# Patient Record
Sex: Female | Born: 1950
Health system: Southern US, Community
[De-identification: ages and names within clinical notes are randomized; demographics above are authoritative.]

## PROBLEM LIST (undated history)

## (undated) DIAGNOSIS — I219 Acute myocardial infarction, unspecified: Secondary | ICD-10-CM

## (undated) DIAGNOSIS — M1990 Unspecified osteoarthritis, unspecified site: Secondary | ICD-10-CM

## (undated) DIAGNOSIS — T4145XA Adverse effect of unspecified anesthetic, initial encounter: Secondary | ICD-10-CM

## (undated) DIAGNOSIS — K649 Unspecified hemorrhoids: Secondary | ICD-10-CM

## (undated) DIAGNOSIS — Z460 Encounter for fitting and adjustment of spectacles and contact lenses: Secondary | ICD-10-CM

## (undated) DIAGNOSIS — E669 Obesity, unspecified: Secondary | ICD-10-CM

## (undated) DIAGNOSIS — E039 Hypothyroidism, unspecified: Secondary | ICD-10-CM

## (undated) DIAGNOSIS — I2510 Atherosclerotic heart disease of native coronary artery without angina pectoris: Secondary | ICD-10-CM

## (undated) DIAGNOSIS — E785 Hyperlipidemia, unspecified: Secondary | ICD-10-CM

## (undated) DIAGNOSIS — Z87440 Personal history of urinary (tract) infections: Secondary | ICD-10-CM

## (undated) DIAGNOSIS — I10 Essential (primary) hypertension: Secondary | ICD-10-CM

## (undated) DIAGNOSIS — R0683 Snoring: Secondary | ICD-10-CM

## (undated) DIAGNOSIS — E119 Type 2 diabetes mellitus without complications: Secondary | ICD-10-CM

## (undated) DIAGNOSIS — T8859XA Other complications of anesthesia, initial encounter: Secondary | ICD-10-CM

## (undated) DIAGNOSIS — T7840XA Allergy, unspecified, initial encounter: Secondary | ICD-10-CM

## (undated) DIAGNOSIS — U071 COVID-19: Secondary | ICD-10-CM

## (undated) DIAGNOSIS — R42 Dizziness and giddiness: Secondary | ICD-10-CM

## (undated) HISTORY — DX: Personal history of urinary (tract) infections: Z87.440

## (undated) HISTORY — PX: OTHER SURGICAL HISTORY: SHX169

## (undated) HISTORY — DX: Allergy, unspecified, initial encounter: T78.40XA

## (undated) HISTORY — DX: Atherosclerotic heart disease of native coronary artery without angina pectoris: I25.10

## (undated) HISTORY — DX: COVID-19: U07.1

## (undated) HISTORY — DX: Obesity, unspecified: E66.9

## (undated) HISTORY — PX: COLONOSCOPY: SHX174

## (undated) HISTORY — DX: Hyperlipidemia, unspecified: E78.5

## (undated) HISTORY — DX: Acute myocardial infarction, unspecified: I21.9

## (undated) HISTORY — PX: TUBAL LIGATION: SHX77

## (undated) HISTORY — DX: Unspecified hemorrhoids: K64.9

---

## 2001-05-04 ENCOUNTER — Emergency Department (HOSPITAL_COMMUNITY): Admission: EM | Admit: 2001-05-04 | Discharge: 2001-05-04 | Payer: Self-pay | Admitting: Emergency Medicine

## 2002-01-16 ENCOUNTER — Other Ambulatory Visit: Admission: RE | Admit: 2002-01-16 | Discharge: 2002-01-16 | Payer: Self-pay | Admitting: *Deleted

## 2004-04-03 ENCOUNTER — Other Ambulatory Visit: Admission: RE | Admit: 2004-04-03 | Discharge: 2004-04-03 | Payer: Self-pay | Admitting: *Deleted

## 2006-09-14 HISTORY — PX: CARDIAC CATHETERIZATION: SHX172

## 2007-01-13 DIAGNOSIS — I2510 Atherosclerotic heart disease of native coronary artery without angina pectoris: Secondary | ICD-10-CM

## 2007-01-13 HISTORY — DX: Atherosclerotic heart disease of native coronary artery without angina pectoris: I25.10

## 2007-02-09 ENCOUNTER — Observation Stay (HOSPITAL_COMMUNITY): Admission: EM | Admit: 2007-02-09 | Discharge: 2007-02-10 | Payer: Self-pay | Admitting: Emergency Medicine

## 2007-03-07 ENCOUNTER — Ambulatory Visit: Payer: Self-pay | Admitting: Cardiology

## 2007-06-14 ENCOUNTER — Ambulatory Visit: Payer: Self-pay | Admitting: Cardiology

## 2007-10-07 ENCOUNTER — Ambulatory Visit: Payer: Self-pay | Admitting: Cardiology

## 2008-02-01 ENCOUNTER — Ambulatory Visit: Payer: Self-pay | Admitting: Cardiology

## 2008-08-01 ENCOUNTER — Ambulatory Visit: Payer: Self-pay | Admitting: Cardiology

## 2009-02-05 ENCOUNTER — Ambulatory Visit: Payer: Self-pay | Admitting: Cardiology

## 2009-07-24 ENCOUNTER — Encounter (INDEPENDENT_AMBULATORY_CARE_PROVIDER_SITE_OTHER): Payer: Self-pay | Admitting: *Deleted

## 2009-07-30 ENCOUNTER — Ambulatory Visit: Payer: Self-pay | Admitting: Cardiology

## 2009-10-10 ENCOUNTER — Emergency Department (HOSPITAL_COMMUNITY): Admission: EM | Admit: 2009-10-10 | Discharge: 2009-10-10 | Payer: Self-pay | Admitting: Emergency Medicine

## 2009-10-17 ENCOUNTER — Ambulatory Visit: Payer: Self-pay | Admitting: Cardiology

## 2009-12-10 ENCOUNTER — Encounter (INDEPENDENT_AMBULATORY_CARE_PROVIDER_SITE_OTHER): Payer: Self-pay | Admitting: *Deleted

## 2010-09-22 ENCOUNTER — Emergency Department (HOSPITAL_COMMUNITY)
Admission: EM | Admit: 2010-09-22 | Discharge: 2010-09-22 | Payer: Self-pay | Source: Home / Self Care | Admitting: Emergency Medicine

## 2010-09-29 LAB — POCT CARDIAC MARKERS
CKMB, poc: 1.5 ng/mL (ref 1.0–8.0)
Myoglobin, poc: 51.3 ng/mL (ref 12–200)
Troponin i, poc: <0.05 ng/mL (ref 0.00–0.09)

## 2010-09-29 LAB — CBC
HCT: 39.5 % (ref 36.0–46.0)
Hemoglobin: 12.9 g/dL (ref 12.0–15.0)
MCH: 26.5 pg (ref 26.0–34.0)
MCHC: 32.7 g/dL (ref 30.0–36.0)
MCV: 81.3 fL (ref 78.0–100.0)
Platelets: 276 10*3/uL (ref 150–400)
RBC: 4.86 MIL/uL (ref 3.87–5.11)
RDW: 13.6 % (ref 11.5–15.5)
WBC: 8.3 10*3/uL (ref 4.0–10.5)

## 2010-09-29 LAB — DIFFERENTIAL
Basophils Absolute: 0.1 10*3/uL (ref 0.0–0.1)
Basophils Relative: 1 % (ref 0–1)
Eosinophils Absolute: 0.4 10*3/uL (ref 0.0–0.7)
Eosinophils Relative: 4 % (ref 0–5)
Lymphocytes Relative: 35 % (ref 12–46)
Lymphs Abs: 2.9 10*3/uL (ref 0.7–4.0)
Monocytes Absolute: 0.5 10*3/uL (ref 0.1–1.0)
Monocytes Relative: 6 % (ref 3–12)
Neutro Abs: 4.5 10*3/uL (ref 1.7–7.7)
Neutrophils Relative %: 54 % (ref 43–77)

## 2010-09-29 LAB — POCT I-STAT, CHEM 8
BUN: 21 mg/dL (ref 6–23)
Calcium, Ion: 1.19 mmol/L (ref 1.12–1.32)
Chloride: 106 mEq/L (ref 96–112)
Creatinine, Ser: 1 mg/dL (ref 0.4–1.2)
Glucose, Bld: 131 mg/dL — ABNORMAL HIGH (ref 70–99)
HCT: 41 % (ref 36.0–46.0)
Hemoglobin: 13.9 g/dL (ref 12.0–15.0)
Potassium: 4 mEq/L (ref 3.5–5.1)
Sodium: 140 mEq/L (ref 135–145)
TCO2: 26 mmol/L (ref 0–100)

## 2010-10-16 NOTE — Miscellaneous (Signed)
  Clinical Lists Changes  Observations: Added new observation of RS STUDY: TRACER- study completion 10/15/09 (12/10/2009 11:08)      Research Study Name: TRACER- study completion 10/15/09

## 2010-11-30 LAB — CBC
HCT: 40.5 % (ref 36.0–46.0)
Hemoglobin: 13.8 g/dL (ref 12.0–15.0)
MCHC: 34 g/dL (ref 30.0–36.0)
MCV: 80.8 fL (ref 78.0–100.0)
Platelets: 236 10*3/uL (ref 150–400)
RBC: 5.01 MIL/uL (ref 3.87–5.11)
RDW: 13.4 % (ref 11.5–15.5)
WBC: 6.3 10*3/uL (ref 4.0–10.5)

## 2010-11-30 LAB — BASIC METABOLIC PANEL
BUN: 22 mg/dL (ref 6–23)
CO2: 27 mEq/L (ref 19–32)
Calcium: 9.7 mg/dL (ref 8.4–10.5)
Chloride: 96 mEq/L (ref 96–112)
Creatinine, Ser: 1.2 mg/dL (ref 0.4–1.2)
GFR calc Af Amer: 55 mL/min — ABNORMAL LOW (ref >60)
GFR calc non Af Amer: 46 mL/min — ABNORMAL LOW (ref >60)
Glucose, Bld: 934 mg/dL (ref 70–99)
Potassium: 4 mEq/L (ref 3.5–5.1)
Sodium: 133 mEq/L — ABNORMAL LOW (ref 135–145)

## 2010-11-30 LAB — URINALYSIS, ROUTINE W REFLEX MICROSCOPIC
Bilirubin Urine: NEGATIVE
Glucose, UA: >1000 mg/dL — AB
Hgb urine dipstick: NEGATIVE
Ketones, ur: 15 mg/dL — AB
Leukocytes, UA: NEGATIVE
Nitrite: NEGATIVE
Protein, ur: NEGATIVE mg/dL
Specific Gravity, Urine: 1.031 — ABNORMAL HIGH (ref 1.005–1.030)
Urobilinogen, UA: 0.2 mg/dL (ref 0.0–1.0)
pH: 5 (ref 5.0–8.0)

## 2010-11-30 LAB — DIFFERENTIAL
Basophils Absolute: 0 10*3/uL (ref 0.0–0.1)
Basophils Relative: 1 % (ref 0–1)
Eosinophils Absolute: 0.2 10*3/uL (ref 0.0–0.7)
Eosinophils Relative: 3 % (ref 0–5)
Lymphocytes Relative: 30 % (ref 12–46)
Lymphs Abs: 1.9 10*3/uL (ref 0.7–4.0)
Monocytes Absolute: 0.5 10*3/uL (ref 0.1–1.0)
Monocytes Relative: 9 % (ref 3–12)
Neutro Abs: 3.6 10*3/uL (ref 1.7–7.7)
Neutrophils Relative %: 58 % (ref 43–77)

## 2010-11-30 LAB — GLUCOSE, CAPILLARY
Glucose-Capillary: 219 mg/dL — ABNORMAL HIGH (ref 70–99)
Glucose-Capillary: 470 mg/dL — ABNORMAL HIGH (ref 70–99)
Glucose-Capillary: >600 mg/dL (ref 70–99)

## 2010-11-30 LAB — URINE MICROSCOPIC-ADD ON

## 2011-01-27 NOTE — H&P (Signed)
NAME:  EMALIA, WITKOP NO.:  [id number]   MEDICAL RECORD NO.:  [id number]          PATIENT TYPE:  INP   LOCATION:  1823                         FACILITY:  [facility]   PHYSICIAN:  Georgann Housekeeper, MD      DATE OF BIRTH:  [birth year]   DATE OF ADMISSION:  02/08/2007  DATE OF DISCHARGE:                              HISTORY & PHYSICAL   PRIMARY CARE PHYSICIAN:  Al Decant. Janey Greaser, MD at [facility].   CHIEF COMPLAINT:  Chest pain.   HISTORY OF PRESENT ILLNESS:  A 60 year old female with history of  hypertension, family history of cardiac disease.  She was playing tennis  after work today and developed some chest pressure, substernal shortness  of breath and nausea that lasted for about an hour.  She said it was  different than her acid symptoms; there was no indigestion.  Denies any  palpitation, cough or fevers.  This lasted for about a hour and then she  went home.  As she was going up the stairs she started getting a little  bit short-winded; lasted for 5 minutes.  She came to the emergency room,  she was chest pain free and no prior symptoms like this before.  She  denies any fever or cough.  No URI symptoms.  No GI symptoms.  No  palpitations or headache.   CARDIAC RISK FACTORS:  Family history of coronary artery disease, strong  in the father who died of MI and had earlier attack in the 50s; and  uncles and aunts of the father side with heart disease.  No tobacco.  Lipids mildly elevated.  Diabetes none.  Hypertension positive, treated  with Atacand.   ALLERGIE:  SULFA, NOVOCAINE.   MEDICATIONS:  Atacand 50 mg daily.   PAST MEDICAL HISTORY:  1. Positive history of BPH.  2. Hypertension.  3. Benign positional vertigo.   PAST SURGICAL HISTORY:  None.   SOCIAL HISTORY:  No tobacco.  Alcohol occasionally.  Married, very  active work outs.  She plays tennis.  She works for [employer].  Has 3 children; married.   FAMILY HISTORY:  Coronary  artery disease.   PHYSICAL EXAMINATION:  VITAL SIGNS:  Ttemperature 97.2, blood pressure  169/96 (repeat 109/60), pulse 63, respiration 18, 95% saturations.  GENERAL:  Awake and alert, in no acute distress.  LUNGS:  Clear.  CARDIOVASCULAR:  Without murmurs.  NECK:  Supple without any JVD.  ABDOMEN:  Soft without tenderness.  EXTREMITIES:  No edema.  NEUROLOGIC:  Exam nonfocal.   LAB DATA:  Cardiac markers in the ED were negative.  D-dimer negative.  Hemoglobin 13, creatinine 1.3, potassium 3.3.   CHEST X-RAY:  Negative.   EKG:  Normal sinus rhythm without any acute changes.   IMPRESSION:  A 60 year old female with history of risk factors of chest  pain exertional, with family history strong  for coronary disease.   CHEST PAIN.  Rule out ischemic origin with his noncardiac origin.  PLAN:  Telemetry, aspirin, oxygen; CK, troponin and EKG.  Check lipid profile  and TSH,  once ruled out with Cardiolite stress test.  [facility]  to further evaluate hypertension.  Continue on Atacand.  Add low dose  Lopressor (12.5 b.i.d.) if blood pressure allows and heart rate allows.      Georgann Housekeeper, MD  Electronically Signed     KH/MEDQ  D:  02/09/2007  T:  02/09/2007  Job:  [id number]   cc:   Al Decant. Janey Greaser, MD

## 2011-01-27 NOTE — Consult Note (Signed)
NAME:  Crystal, Hobbs NO.:  [id number]   MEDICAL RECORD NO.:  [id number]          PATIENT TYPE:  INP   LOCATION:  2905                         FACILITY:  [facility]   PHYSICIAN:  Ulyses Amor, MD DATE OF BIRTH:  [birth year]   DATE OF CONSULTATION:  02/09/2007  DATE OF DISCHARGE:                                 CONSULTATION   Crystal Hobbs is a 60 year old white woman who is admitted to [facility] for further evaluation of chest pain.   The patient, who has no past history of cardiac disease, experienced the  onset of chest pain today while playing tennis.  This was described as a  tightness across her anterior chest.  It did not radiate.  It was  associated with dyspnea but no diaphoresis or nausea.  There were no  exacerbating or ameliorating factors.  It appeared not to be related to  position, meals, or respirations.  It was only relieved once she stopped  playing tennis, and it took nearly 1 hour to resolve.  She experienced  no further chest pain until her return home, when she experienced a  similar episode of chest pain while walking up the stairs.  It, to,  resolved with cessation of the activity.  She experienced no further  chest pain for the rest of the day, although she felt somewhat weak and  lightheaded.  She is free chest pain and otherwise asymptomatic at this  time.   As noted, the patient has no past history of cardiac disease, including  no history of chest pain, myocardial infarction, congestive heart  failure, or arrhythmia.  She reports having experienced palpitations  approximately 20 years ago, but there was no documentation of any  cardiac arrhythmia.   The patient has a number of risk factors for coronary artery disease  including hypertension, dyslipidemia and family history (brother and  father).  There is no history of diabetes mellitus or smoking.   The patient's past medical history is otherwise unremarkable.   MEDICATIONS:  Atacand.   ALLERGIES:  NONE.   OPERATIONS:  Rotator cuff surgery, tubal ligation.   SOCIAL HISTORY:  The patient is married and lives with her husband.  She  owns a [employer].  She neither smokes cigarettes nor drinks  alcohol.   FAMILY HISTORY:  Is notable for coronary artery disease in both her  father and her brother.   REVIEW OF SYSTEMS:  Reveals no problems related to her head, eyes, ears,  nose, mouth, throat, lungs, gastrointestinal system, genitourinary  system, or extremities.  There is no history of neurologic or  psychiatric disorder.  There is no history of fever, chills, or weight  loss.   PHYSICAL EXAMINATION:  VITAL SIGNS:  Blood pressure 109/60.  Pulse 63  and regular.  Respirations 18.  Temperature 97.2.  Pulse ox 95% on room  air.  GENERAL:  The patient was a middle aged white woman in no discomfort.  She was alert, oriented, appropriate, and responsive.  HEENT:  Head, eyes, nose, mouth were normal.  NECK:  Without  thyromegaly or adenopathy.  Carotid pulses were palpable  bilaterally and without bruits.  CARDIAC:  Revealed a normal S1 and S2.  There was no S3, S4, murmur,  rub, or click.  Cardiac rhythm is regular.  No chest wall tenderness was  noted.  LUNGS:  Clear.  ABDOMEN:  Soft and nontender.  There was no mass, hepatosplenomegaly,  bruit, distension, rebound, guarding or rigidity.  Bowel sounds were  normal.  BREAST, PELVIC, RECTAL:  Not performed as they were not pertinent for  the reason for acute care hospitalization.  EXTREMITIES:  Were without edema, deviation, deformity.  Radial and  dorsalis pedal pulses were palpable bilaterally.  NEUROLOGIC:  Unremarkable.   The electrocardiogram was normal.  The chest radiograph report was  pending at the time of this dictation.  The initial set of cardiac  markers revealed a myoglobin of 148, CK MB 3.4, and troponin less than  0.05.  A second set of cardiac markers revealed a  myoglobin of 145, CK  MB 3.0 and troponin of 0.05.  Fibrin derivatives were less than 0.22.  Potassium is 3.3, BUN 29, creatinine 1.3.  White count was 8.0 with a  hemoglobin of 13.0 and hematocrit of 39.4.  The remaining studies were  pending at the time of this dictation.   IMPRESSION:  1. The history is highly suggestive of new onset exertional angina.  2. Hypertension.  3. Dyslipidemia.   RECOMMENDATIONS:  1. Telemetry.  2. Serial cardiac enzymes.  3. Aspirin.  4. Intravenous heparin.  5. Intravenous nitroglycerine.  6. Metoprolol.  7. Fasting lipid profile.  8. Further measures per Dr. Mayford Knife.      Ulyses Amor, MD  Electronically Signed     MSC/MEDQ  D:  02/09/2007  T:  02/09/2007  Job:  [id number]   cc:   Ulyses Amor, MD  Armanda Magic, M.D.

## 2011-01-27 NOTE — Discharge Summary (Signed)
NAMEQUINNLEY, COLASURDO                 ACCOUNT NO.:  [id number]   MEDICAL RECORD NO.:  [id number]          PATIENT TYPE:  INP   LOCATION:  2905                         FACILITY:  [facility]   PHYSICIAN:  Corinna L. Lendell Caprice, MDDATE OF BIRTH:  1951-01-04   DATE OF ADMISSION:  02/08/2007  DATE OF DISCHARGE:                               DISCHARGE SUMMARY   DISCHARGE DIAGNOSES:  1. New onset exertional angina.  2. A 90% obstructive lesion of the first diagonal status post      percutaneous transluminal coronary angioplasty/stent with 2 x 12 mm      mini Vision stent.  3. Hyperlipidemia.  4. Hypokalemia.  5. Hypertension.  6. Abnormal thyroid-stimulating hormone, needs followup of her free T4      free T3 which are pending at this time.  7. Benign positional vertigo.   DISCHARGE MEDICATIONS:  1. Aspirin 325 mg a day.  2. Plavix 75 mg a day for at least a month.  3. Crestor 10 mg nightly.   FOLLOWUP:  1. Followup with Ms. Tylene Fantasia, PA with [facility] on February 17, 2007 at 9:30 a.m.  2. Follow with Dr. [name] on March 07, 2007 at 2:15 p.m.  3. Followup with Dr. Janey Greaser to monitor liver function tests on      Crestor as well as abnormal thyroid function tests.   CONDITION:  Stable.   ACTIVITY:  No lifting for a week.  No driving for two days.  She may  return to work in four days.   DIET:  Her diet should be cardiac.   CONSULTATIONS:  [facility].   PROCEDURE:  Cardiac catheterization by Dr. [name] on Feb 09, 2007  which revealed a low normal ejection fraction of 50% and 90% lesion of  the first diagonal status post PTCA/stent on Feb 09, 2007.   PERTINENT LABORATORY DATA:  CBC:  Unremarkable.  D-dimer less than 0.22.  INR 1.0.  Potassium on admission was 3.3, glucose 133, BUN 29,  creatinine 1.13 and an, otherwise, unremarkable complete metabolic  panel.  At discharge, her potassium was 3.9.  Her peak CPK was 172, peak  CPK-MB was 5.8 and peak  troponin 0.16.  Total cholesterol 231,  triglycerides 265, HDL 32, LDL 146.  TSH 7.255.  Free T4 and free T3 are  pending.   SPECIAL STUDIES IN RADIOLOGY:  A chest x-ray showed nothing acute.  EKG  showed normal sinus rhythm with sinus arrhythmia.   HISTORY AND HOSPITAL COURSE:  Ms. Outten is a 60 year old white female  patient of Dr. Doran Clay who presented to the emergency room with  exertional chest pain.  Initial EKG and cardiac enzymes were negative.  She had unremarkable vital signs and examination.  She was admitted to  the Hospitalist Service and Cardiology was consulted.  She was started  on a heparin drip, aspirin, beta blocker and nitroglycerin.  Her  potassium was repleted.  She underwent cardiac catheterization as above.  During the procedure, she did have some mild bradycardia and the beta  blocker was stopped.  She had no recurrence of chest pain.  She was  found to be hyperlipidemic and was started on Zocor.  She requested that  it be switched to Crestor, as apparently she has a family member who has  been on this in the past.  She was also started on aspirin and Plavix.Marland Kitchen  Her TSH was ordered on admission and was found to be slightly elevated.  Her free T4 and free T3 are pending.  On the day of discharge, she had a  normal exam, normal vital signs and had been cleared by Cardiology for  discharge.      Corinna L. Lendell Caprice, MD  Electronically Signed     CLS/MEDQ  D:  02/10/2007  T:  02/10/2007  Job:  [id number]   cc:   Armanda Magic, M.D.  Al Decant. Janey Greaser, MD

## 2011-01-27 NOTE — Cardiovascular Report (Signed)
NAMETASHEEMA, PERRONE NO.:  [id number]   MEDICAL RECORD NO.:  [id number]          PATIENT TYPE:  INP   LOCATION:  2905                         FACILITY:  [facility]   PHYSICIAN:  Corky Crafts, MDDATE OF BIRTH:  Jun 09, 1951   DATE OF PROCEDURE:  02/09/2007  DATE OF DISCHARGE:                            CARDIAC CATHETERIZATION   REFERRING:  Dr. Armanda Magic and Dr. Al Decant. Foreman.   PROCEDURES PERFORMED:  PCI of the first diagonal.   OPERATOR:  Corky Crafts, MD   INDICATIONS:  Non-ST-segment elevation MI   PROCEDURE:  The diagnostic catheterization was done by Dr. Mayford Knife and a  revealed a 90% lesion in the first diagonal.  The patient had already  consented to a PCI.  ACLS 3.5 guiding catheter was used to engage the  ostium of the left main.  A Prowater wire was used to cross the lesion  in the first diagonal 2-0 x 9 Maverick balloon was placed across the  lesion and deployed at 8 atmospheres for 33 seconds and then again for 6  atmospheres at 10 seconds.  A 2-0 x 12-mm mini Vision stent was then  deployed across the lesion and 9 atmospheres 45 seconds.  There is an  excellent angiographic result.   IMPRESSION:  1. Successful PTCA/stent with 2.0 x 12-mm mini Vision stent.  There      was no residual stenosis.  There is TIMI III flow.   RECOMMENDATIONS:  The Angio max which was used during the intervention  has been turned off.  The patient will be given 600 mg Plavix as a  loading dose.  She will need to be on Plavix 75 mg a day for at least 30  days and aspirin indefinitely.  Before the procedure venous sheath was  placed because she had bradycardia and it was thought she may need a  temporary pacer.  Her heart rate was ranged in the 40s to 60s throughout  the procedure and she was hemodynamically stable.  She had heart rate in  the 60s at the end of the procedure.  A temporary pacemaker was not  placed.  We will DC her beta blocker at this  time.      Corky Crafts, MD  Electronically Signed     JSV/MEDQ  D:  02/09/2007  T:  02/09/2007  Job:  [id number]

## 2011-01-27 NOTE — Cardiovascular Report (Signed)
NAMEMIKALA, Crystal Hobbs NO.:  [id number]   MEDICAL RECORD NO.:  [id number]          PATIENT TYPE:  INP   LOCATION:  2905                         FACILITY:  [facility]   PHYSICIAN:  Armanda Magic, M.D.     DATE OF BIRTH:  Dec 28, 1950   DATE OF PROCEDURE:  02/09/2007  DATE OF DISCHARGE:                            CARDIAC CATHETERIZATION   PROCEDURES:  Left heart catheterization, coronary angiography, left  ventriculography.   OPERATOR:  Armanda Magic, M.D.   INDICATIONS:  Chest pain and angina.   COMPLICATIONS:  None.   IV MEDICATIONS:  None.   IV ACCESS:  Via right femoral artery, 6 French sheath.   This a very pleasant 60 year old white female with a history of  hypertension and dyslipidemia who presents with episodes of exertional  angina classic for coronary ischemia and now presents for cardiac  catheterization.   The patient is brought to the cardiac catheterization laboratory in the  fasting nonsedated state.  Informed consent was obtained.  The patient  was connected to continuous heart rate and pulse oximetry monitoring and  intermittent blood pressure monitoring.  The right groin was prepped and  draped in a sterile fashion.  One percent Xylocaine was used for local  anesthesia.  Using the modified Seldinger technique, a 6-French sheath  was placed in the right femoral artery.  Under fluoroscopic guidance, a  6-French JL-4 catheter was placed in the left coronary artery.  Multiple  cine films were taken in the 30 degree RAO and 40 degree LAO views.  This catheter was then exchanged out over a guidewire for a 6-French JR-  4 catheter which was placed under fluoroscopic guidance in the right  coronary artery.  Multiple cine films were taken at 30 degree and 40  degree LAO views.  This catheter was then exchanged out over a guidewire  for a 6-French angled pigtail catheter which was placed under  fluoroscopic guidance in the left ventricular cavity.  Left  ventriculography was performed in the 30 degree RAO view using a total  of 30 cc of contrast at 15 cc per second.  The catheter was then pulled  back across the aortic valve with no significant gradient noted.  At the  end of the procedure, the patient went on to PCI of the diagonal.   RESULTS:  Left main coronary artery is widely patent and bifurcates to  the left anterior descending artery and left circumflex artery.  The  left anterior descending artery is widely patent throughout its course  at the apex giving rise to a first diagonal.  This diagonal is patent  proximally and then gives rise to a tiny branch and then has an 80%  stenosis in the superior branch which then bifurcates distally into 2  daughter branches.  The ongoing LAD gives rise to a second diagonal  which is widely patent.   The left circumflex is widely patent throughout its course in the AV  groove giving rise to 2 obtuse marginal branches, 1 and 2, which are  both widely patent.  It terminates in a  posterior descending artery.  This is the left dominant system.   The right coronary artery is nondominant and widely patent.   LV function appears low normal, EF 50%, LV pressure 118/10 mmHg, aortic  pressure 127/74 mmHg.   ASSESSMENT:  1. One-vessel obstructive coronary disease.  2. Exertional angina.  3. Hypertension.  4. Dyslipidemia.  5. Bradycardia.   PLAN:  PCI of the diagonal per Dr. Eldridge Dace.  Aspirin and Plavix.      Armanda Magic, M.D.  Electronically Signed     TT/MEDQ  D:  02/09/2007  T:  02/09/2007  Job:  [id number]   cc:   Dr. Janey Greaser

## 2013-02-15 ENCOUNTER — Other Ambulatory Visit: Payer: Self-pay | Admitting: Orthopedic Surgery

## 2013-02-23 ENCOUNTER — Encounter (HOSPITAL_BASED_OUTPATIENT_CLINIC_OR_DEPARTMENT_OTHER)
Admission: RE | Admit: 2013-02-23 | Discharge: 2013-02-23 | Disposition: A | Discharge status: Home / Self Care | Payer: BC Managed Care – PPO | Source: Ambulatory Visit | Attending: Orthopedic Surgery | Admitting: Orthopedic Surgery

## 2013-02-23 ENCOUNTER — Encounter (HOSPITAL_BASED_OUTPATIENT_CLINIC_OR_DEPARTMENT_OTHER): Payer: Self-pay | Admitting: *Deleted

## 2013-02-23 ENCOUNTER — Other Ambulatory Visit: Payer: Self-pay

## 2013-02-23 DIAGNOSIS — Z01818 Encounter for other preprocedural examination: Secondary | ICD-10-CM | POA: Insufficient documentation

## 2013-02-23 DIAGNOSIS — Z01810 Encounter for preprocedural cardiovascular examination: Secondary | ICD-10-CM | POA: Insufficient documentation

## 2013-02-23 DIAGNOSIS — Z01812 Encounter for preprocedural laboratory examination: Secondary | ICD-10-CM | POA: Insufficient documentation

## 2013-02-23 LAB — BASIC METABOLIC PANEL
CO2: 25 mEq/L (ref 19–32)
Chloride: 105 mEq/L (ref 96–112)
Glucose, Bld: 151 mg/dL — ABNORMAL HIGH (ref 70–99)
Potassium: 4 mEq/L (ref 3.5–5.1)
Sodium: 141 mEq/L (ref 135–145)

## 2013-02-23 NOTE — Progress Notes (Signed)
Reviewed all notes from dr turner ov 5/14-pt has not needed any cardiac work up since 08-plays tennis, good ov 5/14-ok for surgery by dr Gelene Mink

## 2013-02-23 NOTE — Progress Notes (Signed)
Pt saw dr Mayford Knife 01/30/13-meds chg and aware she hurt her rcr. Pt to come in for ekg and bmet-to bring all meds and overnight bag

## 2013-02-27 NOTE — H&P (Signed)
Crystal Hobbs is an 62 y.o. female.   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 She works for [employer].  She enjoys playing tennis regularly at a [employer] 3.5 level.  She plays three matches weekly indoors in the winter and four matches weekly in the summer. Tennis is a bit part of her lifestyle.  Two years ago she was diagnosed with type II diabetes.  Her last hemoglobin A1C was 7.0.  She also had a minor cardiac event in 2005 and is followed by Crystal Hobbs.  She has been on  Plavix and now is taking aspirin 162 mg. daily in divided doses.  She does not use nonsteroidal medications except for aspirin due to her cardiac history.  With respect to her hand she has been concerned about swelling in the region of her metacarpophalangeal joints of the right hand and some pain at her thumb CMC joint. Her pain is aching, affecting her both with use and at times of rest.  She has had no triggering.  She is more than 14 years status post right rotator cuff repair by Dr. Eulah Hobbs.  She is now experiencing pain after she plays tennis.  She has weakness of abduction. She has night pain.  She has a strong family history of inflammatory arthritis.    Past Medical History  Diagnosis Date  . Diabetes mellitus without complication   . Hypertension   . Coronary artery disease   . Dyslipidemia   . Vertigo   . Complication of anesthesia     takes along time to wake up  . Arthritis   . Contact lens/glasses fitting     wears glasses or contacts  . Snores   . Hypothyroidism     Past Surgical History  Procedure Laterality Date  . Cardiac catheterization  2008    stent    History reviewed. No pertinent family history. Social History:  reports that she has never smoked. She does not have any smokeless tobacco history on file. She reports that  drinks alcohol. She reports that she does not use illicit drugs.  Allergies:   Allergies  Allergen Reactions  . Actos (Pioglitazone)     hairloss  . Metformin And Related Diarrhea  . Novocain (Procaine)     syncopy  . Sulfa Antibiotics Itching    No prescriptions prior to admission    No results found for this or any previous visit (from the past 48 hour(s)).  No results found.   Pertinent items are noted in HPI.  Height 5\' 6"  (1.676 m), weight 87.091 kg (192 lb).  General appearance: alert Head: Normocephalic, without obvious abnormality Neck: supple, symmetrical, trachea midline Resp: clear to auscultation bilaterally Cardio: regular rate and rhythm GI: normal findings: bowel sounds normal Extremities: . Her shoulder range of motion reveals combined elevation 175 right, 175 left.  She has external rotation 90 right, 90 left at 90 degrees abduction, internal rotation 80 degrees. She has a negative push-off test bilaterally, no sign of adhesive capsulitis.  She has 5/5 strength in all planes of testing, but does have some pain in mid supination and external rotation with forward flexion suggesting either biceps tendinopathy or anterior supraspinatus tendinopathy. She has full range of motion of her elbow, forearm, wrist and fingers.   X-rays of her right shoulder demonstrate prior postsurgical changes.  She has had a generous acromioplasty and distal clavicle resection.  She has developed  recurrent bone formation on the inferior aspect of her clavicle which could be causing a degree of impingement. She has some ossification of her coracoacromial ligaments or perhaps coracoclavicular ligament.    Her MRI is reviewed in detail. She has a large supraspinatus rotator cuff tear that is retracted to the level of the glenohumeral joint. She has anterior/posterior retraction of the infraspinatus and tissues at the rotator interval. She has some atrophy of her supraspinatus muscle belly noted on sagittal films.   Pulses: 2+ and symmetric Skin: normal Neurologic:  Grossly normal    Assessment/Plan Impression:  Recurrent rotator cuff tear right shoulder, complex with retraction and prominent distal clavicle.  Plan:To the OR for right shoulder scope with debridement and attempted reconstruction of rotator cuff.  We had a detailed informed consent regarding this very difficult shoulder predicament. It appears that her rotator cuff tear is mid substance and will be very challenging to repair to the greater tuberosity. We may need to perform a "comb over type" tendon transfer to solve this predicament. There is a chance she will have an insoluble rotator cuff tear due to its chronic nature and may ultimately require a salvage procedure such as a reverse shoulder arthroplasty. Questions regarding her anticipated aftercare were invited and answered in detail. She understands that we can make no guarantees with a recurrent rotator cuff tear. This will be a "best effort" on our part trying to salvage as much shoulder function as possible.    [name],Crystal Hobbs 02/27/2013, 9:28 AM  H&P documentation: 02/28/2013  -History and Physical Reviewed  -Patient has been re-examined  -No change in the plan of care  Wyn Forster, MD

## 2013-02-28 ENCOUNTER — Ambulatory Visit (HOSPITAL_BASED_OUTPATIENT_CLINIC_OR_DEPARTMENT_OTHER)
Admission: RE | Admit: 2013-02-28 | Discharge: 2013-03-01 | Disposition: A | Discharge status: Home / Self Care | Payer: BC Managed Care – PPO | Source: Ambulatory Visit | Attending: Orthopedic Surgery | Admitting: Orthopedic Surgery

## 2013-02-28 ENCOUNTER — Ambulatory Visit (HOSPITAL_BASED_OUTPATIENT_CLINIC_OR_DEPARTMENT_OTHER): Payer: BC Managed Care – PPO | Admitting: Anesthesiology

## 2013-02-28 ENCOUNTER — Encounter (HOSPITAL_BASED_OUTPATIENT_CLINIC_OR_DEPARTMENT_OTHER): Payer: Self-pay | Admitting: Anesthesiology

## 2013-02-28 ENCOUNTER — Encounter (HOSPITAL_BASED_OUTPATIENT_CLINIC_OR_DEPARTMENT_OTHER)
Admission: RE | Disposition: A | Discharge status: Home / Self Care | Payer: Self-pay | Source: Ambulatory Visit | Attending: Orthopedic Surgery

## 2013-02-28 ENCOUNTER — Encounter (HOSPITAL_BASED_OUTPATIENT_CLINIC_OR_DEPARTMENT_OTHER): Payer: Self-pay | Admitting: *Deleted

## 2013-02-28 DIAGNOSIS — Z886 Allergy status to analgesic agent status: Secondary | ICD-10-CM | POA: Insufficient documentation

## 2013-02-28 DIAGNOSIS — R0989 Other specified symptoms and signs involving the circulatory and respiratory systems: Secondary | ICD-10-CM | POA: Insufficient documentation

## 2013-02-28 DIAGNOSIS — M129 Arthropathy, unspecified: Secondary | ICD-10-CM | POA: Insufficient documentation

## 2013-02-28 DIAGNOSIS — M67919 Unspecified disorder of synovium and tendon, unspecified shoulder: Secondary | ICD-10-CM | POA: Insufficient documentation

## 2013-02-28 DIAGNOSIS — M249 Joint derangement, unspecified: Secondary | ICD-10-CM | POA: Insufficient documentation

## 2013-02-28 DIAGNOSIS — R0609 Other forms of dyspnea: Secondary | ICD-10-CM | POA: Insufficient documentation

## 2013-02-28 DIAGNOSIS — Z882 Allergy status to sulfonamides status: Secondary | ICD-10-CM | POA: Insufficient documentation

## 2013-02-28 DIAGNOSIS — E119 Type 2 diabetes mellitus without complications: Secondary | ICD-10-CM | POA: Insufficient documentation

## 2013-02-28 DIAGNOSIS — E785 Hyperlipidemia, unspecified: Secondary | ICD-10-CM | POA: Insufficient documentation

## 2013-02-28 DIAGNOSIS — I2510 Atherosclerotic heart disease of native coronary artery without angina pectoris: Secondary | ICD-10-CM | POA: Insufficient documentation

## 2013-02-28 DIAGNOSIS — M719 Bursopathy, unspecified: Secondary | ICD-10-CM | POA: Insufficient documentation

## 2013-02-28 DIAGNOSIS — Z9861 Coronary angioplasty status: Secondary | ICD-10-CM | POA: Insufficient documentation

## 2013-02-28 DIAGNOSIS — Z888 Allergy status to other drugs, medicaments and biological substances status: Secondary | ICD-10-CM | POA: Insufficient documentation

## 2013-02-28 DIAGNOSIS — M6790 Unspecified disorder of synovium and tendon, unspecified site: Secondary | ICD-10-CM | POA: Insufficient documentation

## 2013-02-28 DIAGNOSIS — I10 Essential (primary) hypertension: Secondary | ICD-10-CM | POA: Insufficient documentation

## 2013-02-28 DIAGNOSIS — M25819 Other specified joint disorders, unspecified shoulder: Secondary | ICD-10-CM | POA: Insufficient documentation

## 2013-02-28 DIAGNOSIS — E039 Hypothyroidism, unspecified: Secondary | ICD-10-CM | POA: Insufficient documentation

## 2013-02-28 HISTORY — DX: Type 2 diabetes mellitus without complications: E11.9

## 2013-02-28 HISTORY — DX: Essential (primary) hypertension: I10

## 2013-02-28 HISTORY — DX: Unspecified osteoarthritis, unspecified site: M19.90

## 2013-02-28 HISTORY — DX: Adverse effect of unspecified anesthetic, initial encounter: T41.45XA

## 2013-02-28 HISTORY — DX: Dizziness and giddiness: R42

## 2013-02-28 HISTORY — DX: Hyperlipidemia, unspecified: E78.5

## 2013-02-28 HISTORY — PX: SHOULDER ARTHROSCOPY WITH OPEN ROTATOR CUFF REPAIR: SHX6092

## 2013-02-28 HISTORY — DX: Atherosclerotic heart disease of native coronary artery without angina pectoris: I25.10

## 2013-02-28 HISTORY — DX: Snoring: R06.83

## 2013-02-28 HISTORY — DX: Other complications of anesthesia, initial encounter: T88.59XA

## 2013-02-28 HISTORY — DX: Encounter for fitting and adjustment of spectacles and contact lenses: Z46.0

## 2013-02-28 HISTORY — DX: Hypothyroidism, unspecified: E03.9

## 2013-02-28 LAB — POCT HEMOGLOBIN-HEMACUE: Hemoglobin: 12.6 g/dL (ref 12.0–15.0)

## 2013-02-28 LAB — GLUCOSE, CAPILLARY
Glucose-Capillary: 106 mg/dL — ABNORMAL HIGH (ref 70–99)
Glucose-Capillary: 104 mg/dL — ABNORMAL HIGH (ref 70–99)
Glucose-Capillary: 105 mg/dL — ABNORMAL HIGH (ref 70–99)

## 2013-02-28 SURGERY — ARTHROSCOPY, SHOULDER WITH REPAIR, ROTATOR CUFF, OPEN
Anesthesia: General | Site: Shoulder | Laterality: Right | Wound class: Clean

## 2013-02-28 MED ORDER — ASPIRIN 81 MG PO TABS: 81.0000 mg | ORAL_TABLET | Freq: Once | ORAL | Status: DC

## 2013-02-28 MED ORDER — METOCLOPRAMIDE HCL 5 MG PO TABS: 5.0000 mg | ORAL_TABLET | Freq: Three times a day (TID) | ORAL | Status: DC | PRN

## 2013-02-28 MED ORDER — SUCCINYLCHOLINE CHLORIDE 20 MG/ML IJ SOLN
INTRAMUSCULAR | Status: DC | PRN
  Administered 2013-02-28: 100 mg via INTRAVENOUS

## 2013-02-28 MED ORDER — INSULIN GLARGINE 100 UNIT/ML ~~LOC~~ SOLN: 28.0000 [IU] | Freq: Every day | SUBCUTANEOUS | Status: DC

## 2013-02-28 MED ORDER — OXYCODONE HCL 5 MG/5ML PO SOLN: 5.0000 mg | Freq: Once | ORAL | Status: AC | PRN

## 2013-02-28 MED ORDER — LACTATED RINGERS IV SOLN
INTRAVENOUS | Status: DC
  Administered 2013-02-28 (×2): via INTRAVENOUS

## 2013-02-28 MED ORDER — ONDANSETRON HCL 4 MG/2ML IJ SOLN
4.0000 mg | Freq: Once | INTRAMUSCULAR | Status: AC | PRN
  Administered 2013-02-28: 4 mg via INTRAVENOUS

## 2013-02-28 MED ORDER — CEPHALEXIN 500 MG PO CAPS: 500.0000 mg | ORAL_CAPSULE | Freq: Three times a day (TID) | ORAL | Status: DC

## 2013-02-28 MED ORDER — FENTANYL CITRATE 0.05 MG/ML IJ SOLN
INTRAMUSCULAR | Status: DC | PRN
  Administered 2013-02-28: 25 ug via INTRAVENOUS
  Administered 2013-02-28: 50 ug via INTRAVENOUS
  Administered 2013-02-28: 25 ug via INTRAVENOUS

## 2013-02-28 MED ORDER — ONDANSETRON HCL 4 MG/2ML IJ SOLN
INTRAMUSCULAR | Status: DC | PRN
  Administered 2013-02-28: 4 mg via INTRAVENOUS

## 2013-02-28 MED ORDER — HYDROMORPHONE HCL 2 MG PO TABS: ORAL_TABLET | ORAL | Status: DC

## 2013-02-28 MED ORDER — GLYCOPYRROLATE 0.2 MG/ML IJ SOLN
INTRAMUSCULAR | Status: DC | PRN
  Administered 2013-02-28: 0.2 mg via INTRAVENOUS

## 2013-02-28 MED ORDER — DEXAMETHASONE SODIUM PHOSPHATE 4 MG/ML IJ SOLN: INTRAMUSCULAR | Status: DC | PRN

## 2013-02-28 MED ORDER — OXYCODONE HCL 5 MG PO TABS: 5.0000 mg | ORAL_TABLET | Freq: Once | ORAL | Status: AC | PRN

## 2013-02-28 MED ORDER — SODIUM CHLORIDE 0.9 % IV SOLN
INTRAVENOUS | Status: DC
  Administered 2013-02-28: 23:00:00 via INTRAVENOUS

## 2013-02-28 MED ORDER — MIDAZOLAM HCL 2 MG/2ML IJ SOLN
1.0000 mg | INTRAMUSCULAR | Status: DC | PRN
  Administered 2013-02-28: 2 mg via INTRAVENOUS

## 2013-02-28 MED ORDER — HYDROMORPHONE HCL PF 1 MG/ML IJ SOLN
0.5000 mg | INTRAMUSCULAR | Status: DC | PRN
  Administered 2013-03-01: 1 mg via INTRAVENOUS
  Administered 2013-03-01 (×2): 0.5 mg via INTRAVENOUS
  Administered 2013-03-01: 1 mg via INTRAVENOUS

## 2013-02-28 MED ORDER — ONDANSETRON HCL 4 MG/2ML IJ SOLN
4.0000 mg | Freq: Four times a day (QID) | INTRAMUSCULAR | Status: DC | PRN
  Administered 2013-03-01: 4 mg via INTRAVENOUS

## 2013-02-28 MED ORDER — METOCLOPRAMIDE HCL 5 MG/ML IJ SOLN: 5.0000 mg | Freq: Three times a day (TID) | INTRAMUSCULAR | Status: DC | PRN

## 2013-02-28 MED ORDER — PHENYLEPHRINE HCL 10 MG/ML IJ SOLN
INTRAMUSCULAR | Status: DC | PRN
  Administered 2013-02-28: 80 ug via INTRAVENOUS

## 2013-02-28 MED ORDER — INSULIN ASPART 100 UNIT/ML ~~LOC~~ SOLN: 0.0000 [IU] | Freq: Three times a day (TID) | SUBCUTANEOUS | Status: DC

## 2013-02-28 MED ORDER — ROPIVACAINE HCL 5 MG/ML IJ SOLN
INTRAMUSCULAR | Status: DC | PRN
  Administered 2013-02-28: 25 mL

## 2013-02-28 MED ORDER — PHENYLEPHRINE HCL 10 MG/ML IJ SOLN
10.0000 mg | INTRAVENOUS | Status: DC | PRN
  Administered 2013-02-28: 50 ug/min via INTRAVENOUS

## 2013-02-28 MED ORDER — LEVOTHYROXINE SODIUM 50 MCG PO TABS: 50.0000 ug | ORAL_TABLET | Freq: Every day | ORAL | Status: DC

## 2013-02-28 MED ORDER — CHLORHEXIDINE GLUCONATE 4 % EX LIQD: 60.0000 mL | Freq: Once | CUTANEOUS | Status: DC

## 2013-02-28 MED ORDER — FENTANYL CITRATE 0.05 MG/ML IJ SOLN
50.0000 ug | INTRAMUSCULAR | Status: DC | PRN
  Administered 2013-02-28: 100 ug via INTRAVENOUS

## 2013-02-28 MED ORDER — HYDROMORPHONE HCL PF 1 MG/ML IJ SOLN: 0.2500 mg | INTRAMUSCULAR | Status: DC | PRN

## 2013-02-28 MED ORDER — OXYCODONE-ACETAMINOPHEN 5-325 MG PO TABS
1.0000 | ORAL_TABLET | ORAL | Status: DC | PRN
  Administered 2013-02-28 – 2013-03-01 (×3): 2 via ORAL

## 2013-02-28 MED ORDER — LIDOCAINE HCL (CARDIAC) 20 MG/ML IV SOLN
INTRAVENOUS | Status: DC | PRN
  Administered 2013-02-28: 80 mg via INTRAVENOUS

## 2013-02-28 MED ORDER — ONDANSETRON HCL 4 MG PO TABS: 4.0000 mg | ORAL_TABLET | Freq: Four times a day (QID) | ORAL | Status: DC | PRN

## 2013-02-28 MED ORDER — SODIUM CHLORIDE 0.9 % IV SOLN: INTRAVENOUS | Status: DC

## 2013-02-28 MED ORDER — PROPOFOL 10 MG/ML IV BOLUS
INTRAVENOUS | Status: DC | PRN
  Administered 2013-02-28: 200 mg via INTRAVENOUS

## 2013-02-28 SURGICAL SUPPLY — 84 items
ANCH SUT SWLK 19.1 CLS EYLT VT (Anchor) ×1 IMPLANT
ANCH SUT SWLK 19.1X4.75 (Anchor) ×2 IMPLANT
ANCH SUT SWLK 19.1X5.5 CLS EL (Anchor) ×1 IMPLANT
ANCHOR BIO SWLOCK 4.75 W/TIG (Anchor) ×1 IMPLANT
ANCHOR PEEK SWIVEL LOCK 5.5 (Anchor) ×1 IMPLANT
ANCHOR SUT BIO SW 4.75X19.1 (Anchor) ×2 IMPLANT
BANDAGE ADHESIVE 1X3 (GAUZE/BANDAGES/DRESSINGS) IMPLANT
BLADE AVERAGE 25X9 (BLADE) IMPLANT
BLADE CUTTER MENIS 5.5 (BLADE) ×1 IMPLANT
BLADE SURG 15 STRL LF DISP TIS (BLADE) ×2 IMPLANT
BLADE SURG 15 STRL SS (BLADE) ×4
BUR EGG 3PK/BX (BURR) IMPLANT
BUR OVAL 6.0 (BURR) ×2 IMPLANT
CANISTER OMNI JUG 16 LITER (MISCELLANEOUS) ×2 IMPLANT
CANISTER SUCTION 2500CC (MISCELLANEOUS) ×5 IMPLANT
CANNULA SHOULDER 7CM (CANNULA) ×1 IMPLANT
CANNULA TWIST IN 8.25X7CM (CANNULA) ×1 IMPLANT
CLEANER CAUTERY TIP 5X5 PAD (MISCELLANEOUS) IMPLANT
CLOTH BEACON ORANGE TIMEOUT ST (SAFETY) ×2 IMPLANT
CUTTER MENISCUS  4.2MM (BLADE) ×1
CUTTER MENISCUS 4.2MM (BLADE) ×1 IMPLANT
DECANTER SPIKE VIAL GLASS SM (MISCELLANEOUS) IMPLANT
DRAPE INCISE IOBAN 66X45 STRL (DRAPES) ×2 IMPLANT
DRAPE STERI 35X30 U-POUCH (DRAPES) ×2 IMPLANT
DRAPE SURG 17X23 STRL (DRAPES) ×2 IMPLANT
DRAPE U-SHAPE 47X51 STRL (DRAPES) ×2 IMPLANT
DRAPE U-SHAPE 76X120 STRL (DRAPES) ×4 IMPLANT
DRSG PAD ABDOMINAL 8X10 ST (GAUZE/BANDAGES/DRESSINGS) ×2 IMPLANT
DURAPREP 26ML APPLICATOR (WOUND CARE) ×2 IMPLANT
ELECT REM PT RETURN 9FT ADLT (ELECTROSURGICAL) ×2
ELECTRODE REM PT RTRN 9FT ADLT (ELECTROSURGICAL) IMPLANT
GLOVE BIO SURGEON STRL SZ 6.5 (GLOVE) ×2 IMPLANT
GLOVE BIOGEL M STRL SZ7.5 (GLOVE) ×2 IMPLANT
GLOVE BIOGEL PI IND STRL 7.0 (GLOVE) IMPLANT
GLOVE BIOGEL PI IND STRL 8 (GLOVE) ×2 IMPLANT
GLOVE BIOGEL PI INDICATOR 7.0 (GLOVE) ×2
GLOVE BIOGEL PI INDICATOR 8 (GLOVE) ×2
GLOVE ORTHO TXT STRL SZ7.5 (GLOVE) ×4 IMPLANT
GOWN BRE IMP PREV XXLGXLNG (GOWN DISPOSABLE) ×5 IMPLANT
GOWN PREVENTION PLUS XLARGE (GOWN DISPOSABLE) ×2 IMPLANT
NDL SCORPION (NEEDLE) ×1 IMPLANT
NDL SUT 6 .5 CRC .975X.05 MAYO (NEEDLE) IMPLANT
NEEDLE MAYO TAPER (NEEDLE)
NEEDLE MINI RC 24MM (NEEDLE) IMPLANT
NEEDLE SCORPION (NEEDLE) ×2 IMPLANT
PACK ARTHROSCOPY DSU (CUSTOM PROCEDURE TRAY) ×2 IMPLANT
PACK BASIN DAY SURGERY FS (CUSTOM PROCEDURE TRAY) ×2 IMPLANT
PAD CLEANER CAUTERY TIP 5X5 (MISCELLANEOUS) ×1
PASSER SUT SWANSON 36MM LOOP (INSTRUMENTS) IMPLANT
PENCIL BUTTON HOLSTER BLD 10FT (ELECTRODE) ×1 IMPLANT
SLEEVE SCD COMPRESS KNEE MED (MISCELLANEOUS) ×2 IMPLANT
SLING ARM FOAM STRAP LRG (SOFTGOODS) ×1 IMPLANT
SLING ARM FOAM STRAP MED (SOFTGOODS) IMPLANT
SPONGE GAUZE 4X4 12PLY (GAUZE/BANDAGES/DRESSINGS) ×2 IMPLANT
SPONGE LAP 4X18 X RAY DECT (DISPOSABLE) ×1 IMPLANT
STRIP CLOSURE SKIN 1/2X4 (GAUZE/BANDAGES/DRESSINGS) IMPLANT
SUCTION FRAZIER TIP 10 FR DISP (SUCTIONS) IMPLANT
SUT ETHIBOND 2 OS 4 DA (SUTURE) IMPLANT
SUT ETHILON 4 0 PS 2 18 (SUTURE) IMPLANT
SUT FIBERWIRE #2 38 T-5 BLUE (SUTURE) ×2
SUT FIBERWIRE 3-0 18 TAPR NDL (SUTURE)
SUT PROLENE 1 CT (SUTURE) IMPLANT
SUT PROLENE 3 0 PS 2 (SUTURE) ×2 IMPLANT
SUT TIGER TAPE 7 IN WHITE (SUTURE) IMPLANT
SUT VIC AB 0 CT1 27 (SUTURE) ×2
SUT VIC AB 0 CT1 27XBRD ANBCTR (SUTURE) IMPLANT
SUT VIC AB 0 SH 27 (SUTURE) ×1 IMPLANT
SUT VIC AB 2-0 SH 27 (SUTURE)
SUT VIC AB 2-0 SH 27XBRD (SUTURE) IMPLANT
SUT VIC AB 3-0 SH 27 (SUTURE)
SUT VIC AB 3-0 SH 27X BRD (SUTURE) IMPLANT
SUT VIC AB 3-0 X1 27 (SUTURE) IMPLANT
SUTURE FIBERWR #2 38 T-5 BLUE (SUTURE) IMPLANT
SUTURE FIBERWR 3-0 18 TAPR NDL (SUTURE) IMPLANT
SYR 3ML 23GX1 SAFETY (SYRINGE) IMPLANT
SYR BULB 3OZ (MISCELLANEOUS) ×1 IMPLANT
TAPE FIBER 2MM 7IN #2 BLUE (SUTURE) ×1 IMPLANT
TAPE PAPER 3X10 WHT MICROPORE (GAUZE/BANDAGES/DRESSINGS) ×2 IMPLANT
TOWEL OR 17X24 6PK STRL BLUE (TOWEL DISPOSABLE) ×2 IMPLANT
TUBE CONNECTING 20X1/4 (TUBING) ×2 IMPLANT
TUBING ARTHROSCOPY IRRIG 16FT (MISCELLANEOUS) ×1 IMPLANT
WAND STAR VAC 90 (SURGICAL WAND) ×2 IMPLANT
WATER STERILE IRR 1000ML POUR (IV SOLUTION) ×2 IMPLANT
YANKAUER SUCT BULB TIP NO VENT (SUCTIONS) ×1 IMPLANT

## 2013-02-28 NOTE — Progress Notes (Signed)
Assisted Dr. Crews with right, ultrasound guided, interscalene  block. Side rails up, monitors on throughout procedure. See vital signs in flow sheet. Tolerated Procedure well. 

## 2013-02-28 NOTE — Anesthesia Postprocedure Evaluation (Signed)
  Anesthesia Post-op Note  Patient: Crystal Hobbs  Procedure(s) Performed: Procedure(s): right shoulder arthroscopy, distal clavicle sculpting, open rotator cuff repair (Right)  Patient Location: PACU  Anesthesia Type:GA combined with regional for post-op pain  Level of Consciousness: awake, alert  and oriented  Airway and Oxygen Therapy: Patient Spontanous Breathing  Post-op Pain: none  Post-op Assessment: Post-op Vital signs reviewed  Post-op Vital Signs: Reviewed  Complications: No apparent anesthesia complications

## 2013-02-28 NOTE — Brief Op Note (Signed)
02/28/2013  2:22 PM  PATIENT:  Crystal Hobbs  62 y.o. female  PRE-OPERATIVE DIAGNOSIS:  RIGHT ROTATOR CUFF TEAR RECURRENT  POST-OPERATIVE DIAGNOSIS: Massive, chronic right rotator cuff tear recurrent,  Impingement from distal clavicle, biceps tendinopathy  PROCEDURE:  1) arthroscopic evaluation of right shoulder with MRI documented retracted chronic mid substance rotator cuff tear. 2) Arthroscopic  tenolysis of infraspinatus and supraspinatus with interval slide 3)  Open reconstruction of rotator cuff cable by superior transfer of subscapularis 4)  Biceps tenodesis at intertubercular groove 5)  Arthroscopic resection of distal clavicle  SURGEON:  Surgeon(s) and Role:    * Wyn Forster., MD - Primary  PHYSICIAN ASSISTANT:   ASSISTANTS: Mallory Shirk.A-C   ANESTHESIA:   general  EBL:  Total I/O In: 1000 [I.V.:1000] Out: -   BLOOD ADMINISTERED:none  DRAINS: none   LOCAL MEDICATIONS USED: Ropivacaine brachial plexus block  SPECIMEN:  No Specimen  DISPOSITION OF SPECIMEN:  N/A  COUNTS:  YES  TOURNIQUET:  * No tourniquets in log *  DICTATION: .Other Dictation: Dictation Number [id number]  PLAN OF CARE: Admit for overnight observation  PATIENT DISPOSITION:  PACU - hemodynamically stable.   Delay start of Pharmacological VTE agent (>24hrs) due to surgical blood loss or risk of bleeding: not applicable

## 2013-02-28 NOTE — Anesthesia Procedure Notes (Addendum)
Anesthesia Regional Block:  Interscalene brachial plexus block  Pre-Anesthetic Checklist: ,, timeout performed, Correct Patient, Correct Site, Correct Laterality, Correct Procedure, Correct Position, site marked, Risks and benefits discussed,  Surgical consent,  Pre-op evaluation,  At surgeon's request and post-op pain management  Laterality: Right and Upper  Prep: chloraprep       Needles:  Injection technique: Single-shot  Needle Type: Echogenic Needle     Needle Length: 5cm 5 cm Needle Gauge: 21    Additional Needles:  Procedures: ultrasound guided (picture in chart) Interscalene brachial plexus block Narrative:  Start time: 02/28/2013 10:15 AM End time: 02/28/2013 10:23 AM Injection made incrementally with aspirations every 5 mL.  Performed by: Personally  Anesthesiologist: Sheldon Silvan, MD  Interscalene brachial plexus block Procedure Name: Intubation Performed by: Lance Coon Pre-anesthesia Checklist: Patient identified, Emergency Drugs available, Suction available and Patient being monitored Patient Re-evaluated:Patient Re-evaluated prior to inductionOxygen Delivery Method: Circle System Utilized Preoxygenation: Pre-oxygenation with 100% oxygen Intubation Type: IV induction Ventilation: Mask ventilation without difficulty Grade View: Grade I Tube type: Oral Number of attempts: 1 Airway Equipment and Method: stylet,  oral airway and Video-laryngoscopy Placement Confirmation: ETT inserted through vocal cords under direct vision,  positive ETCO2 and breath sounds checked- equal and bilateral Tube secured with: Tape Dental Injury: Teeth and Oropharynx as per pre-operative assessment

## 2013-02-28 NOTE — Transfer of Care (Signed)
Immediate Anesthesia Transfer of Care Note  Patient: Crystal Hobbs  Procedure(s) Performed: Procedure(s): right shoulder arthroscopy, distal clavicle sculpting, open rotator cuff repair (Right)  Patient Location: PACU  Anesthesia Type:GA combined with regional for post-op pain  Level of Consciousness: awake  Airway & Oxygen Therapy: Patient Spontanous Breathing and Patient connected to face mask oxygen  Post-op Assessment: Report given to PACU RN and Post -op Vital signs reviewed and stable  Post vital signs: Reviewed and stable  Complications: No apparent anesthesia complications

## 2013-02-28 NOTE — Anesthesia Preprocedure Evaluation (Addendum)
Anesthesia Evaluation  Patient identified by MRN, date of birth, ID band Patient awake    Reviewed: Allergy & Precautions, H&P , NPO status , Patient's Chart, lab work & pertinent test results  Airway Mallampati: II TM Distance: >3 FB Neck ROM: Full    Dental  (+) Teeth Intact and Dental Advisory Given   Pulmonary  breath sounds clear to auscultation        Cardiovascular hypertension, Pt. on medications + CAD Rhythm:Regular Rate:Normal     Neuro/Psych    GI/Hepatic   Endo/Other  diabetes, Well Controlled, Type 2, Insulin Dependent  Renal/GU      Musculoskeletal   Abdominal   Peds  Hematology   Anesthesia Other Findings   Reproductive/Obstetrics                           Anesthesia Physical Anesthesia Plan  ASA: III  Anesthesia Plan: General   Post-op Pain Management:    Induction: Intravenous  Airway Management Planned: Oral ETT  Additional Equipment:   Intra-op Plan:   Post-operative Plan: Extubation in OR  Informed Consent: I have reviewed the patients History and Physical, chart, labs and discussed the procedure including the risks, benefits and alternatives for the proposed anesthesia with the patient or authorized representative who has indicated his/her understanding and acceptance.   Dental advisory given  Plan Discussed with: CRNA, Anesthesiologist and Surgeon  Anesthesia Plan Comments:         Anesthesia Quick Evaluation

## 2013-02-28 NOTE — Op Note (Signed)
[id number] 

## 2013-03-01 NOTE — Op Note (Signed)
NAMEMarland Kitchen  Crystal, Hobbs NO.:  [id number]  MEDICAL RECORD NO.:  [id number]  LOCATION:                                 FACILITY:  PHYSICIAN:  Crystal Fitch. Kent Hobbs, M.D. DATE OF BIRTH:  07/18/1951  DATE OF PROCEDURE:  02/28/2013 DATE OF DISCHARGE:  02/28/2013                              OPERATIVE REPORT   PREOPERATIVE DIAGNOSES:  Massive chronically retracted supraspinatus rotator cuff tear with atrophy of supraspinatus muscle belly and grade 2 tear of subscapularis, also retained distal clavicle with inferior osteophyte causing chronic impingement and status post acromionectomy with significant ossification of coracoacromial ligament.  OPERATION: 1. Arthroscopic evaluation of right glenohumeral joint and subacromial     space. 2. Arthroscopic bursectomy with tenolysis infraspinatus and     supraspinatus tendons with posterior interval slide. 3. Open reconstruction of rotator cuff "cable" by superior transfer of     subscapularis and repair to infraspinatus and supraspinatus     remnant. 4. Biceps tenodesis at intertubercular groove. 5. Arthroscopic resection of distal clavicle.  OPERATING SURGEON:  Crystal Fitch. Crystal Hobbs, M.D.  ASSISTANT:  Crystal Reeks [name], PA-C  ANESTHESIA:  General endotracheal supplemented by a right interscalene block.  SUPERVISING ANESTHESIOLOGIST:  Crystal Hobbs, M.D.  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  She has type 2 diabetes.  She had, had a prior rotator cuff tear by one of our orthopedic colleagues in [place] with a generous acromionectomy and partial distal clavicle resection.  She returned to playing tennis, but in the past 2 years, had increasing pain and impairment of the right shoulder.  She  initially presented for evaluation of her shoulder in February, 2013.  Her exam suggested that she had a recurrent right rotator cuff tear, and growth of bone at the distal clavicle likely causing impingement.  We advised her at that time, to consider imaging and arthroscopic evaluation of the shoulder.  She decided that she would prefer to wait and wanted to wait and so she qualified for Medicare to treat her shoulder.  She returned in April 2014, reporting that her shoulder impairment was substantially worse.  She could not sleep comfortably at night or sleep on her right side.  She performed a therapy program for nearly 1 year without relief under the supervision of our physical therapist, Crystal Hobbs.  I advised her in April 2014, to obtain an MRI of her right shoulder.  This was obtained at [facility] on Jan 17, 2013.  The MRI documented a full- thickness supraspinatus tendon tear with medial tendon retraction to the glenohumeral joint and atrophy of the supraspinatus muscle.  The portion of the infraspinatus was also significantly impaired with tendinopathy and inferiorly projecting spur on the distal clavicle was noted.  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  We had a detailed informed consent regarding her predicament.  A significant  portion of her cuff tear appeared to be mid substance and extremely chronic.  We explained that it maybe impossible to perform a repair.  A reconstruction or partial reconstruction was likely to be all we could accomplish.  We advised her that we would be willing to arthroscope her shoulder and attempt a "best effort" at repairing or reconstructing a rotator cuff.  She understands with her diabetes she may have some healing issues and an increased risk of infection.  The procedure, aftercare, potential risks and  benefits were explained in detail.  She scheduled surgery at this time.  She was interviewed in the holding area of the [facility] by Dr. Sondra Hobbs, who provided detailed anesthesia informed consent.  Dr. Sondra Hobbs recommended a perioperative scalene block.  This was placed with ultrasound guidance without complication in the holding area.  Crystal Hobbs had excellent anesthesia of the right arm and forequarter.  She subsequently was transferred to room 2 of the [facility], where she was placed in supine position on the operating table.  Her right shoulder had been marked in the holding area as a proper surgical site.  Under Dr. Ivin Booty' direct supervision, general endotracheal anesthesia was induced, followed by careful position in the beach-chair position with aid of a torso and head holder designed for shoulder arthroscopy.  Passive compression devices were applied to her calves.  A 2 g of Ancef were administered as an IV prophylactic antibiotic.  Her preoperative glucose was noted to 106.  Warming blankets were applied. The right arm and forequarter were prepped with DuraPrep and draped with impervious arthroscopy drapes, sealed with IO band drape.  Following routine surgical time-out, the arthroscope was introduced through a standard posterior viewing portal with anterior switching stick technique.  Diagnostic arthroscopy revealed profound tendinopathy of the biceps tendon that was subluxed anteriorly.  There was a grade 2 tear of the subscapularis with the comma sign.  The entire supraspinatus had ruptured off the greater tuberosity and had retracted to the glenoid. There was abundant synovitis noted.  The deep surface infraspinatus had a pasta-type lesion.  An anterior portal was created under direct vision, followed by use of a suction shaver to debride the biceps down to a thin remnant adjacent to the superior glenoid and to debride necrotic infraspinatus and subscapularis  tendon.  It appeared that we would need to perform a tendon transfer to recreate the rotator cuff, "cable."  We debrided the glenohumeral joint thoroughly and subsequently removed the arthroscope and placed in the subacromial space.  Thorough bursectomy allowed visualization of a very prominent distal clavicle with a hook-type osteophyte.  This was leveled with a suction shaver brought in laterally and anteriorly, fully decompressing the cuff.  With the scope laterally and posteriorly and with shaving performed anteriorly and laterally, we performed a tenolysis of the infraspinatus, supraspinatus tendons, and performed a posterior interval slide.  The spine of the scapula was well visualized.  We obtained hemostasis and subsequently placed a pair of fiber tape sutures in the supraspinatus and infraspinatus with arthroscopic technique with the scorpion suture passer and brought them out through portals.  We were able to remove this supraspinatus laterally approximately 4 cm, and the infraspinatus anteriorly.  With a superior 1/3rd transfer of the subscapularis, I felt we could recreate the rotator cuff cable and lateralize the supraspinatus to the greater tuberosity at the biceps grooves.  We subsequently removed the arthroscopic equipment and performed an anterior middle third deltoid splitting incision.  A Arthrex shoulder retractor was placed.  We managed the fiber tapes through a posterior portal while we proceeded to perform bursectomy leveling of the greater tuberosity to a bleeding cortical surface.  We performed a biceps tenodesis with multiple grasping sutures of #2 FiberWire at the biceps intertubercular groove and planned on securing the suture tails with one of our swivel locks for the repair.  We then placed the arm in substantial external rotation and forward flexion.  We performed an anterior bursectomy and identified the subscapularis tear.  With the aid of the scorpion  suture passer, I placed a fiber tape through the subscapularis with reverse mattress technique and subsequently dissected with a fresh scalpel blade.  The upper third of the subscapularis off the lesser tuberosity, and split the subscapularis approximately 4 cm medially.  Care was taken to avoid the coracoid and the neurovascular structures.  We were able to advance the subscapularis to meet the infraspinatus and remnants of the supraspinatus.  We then placed a medial footprint swivel lock with FiberTape and FiberWire suture.  We used the scorpion passer to bring the infraspinatus and supraspinatus remnant to the subscapularis while placing the lateral traction.  We performed a interval closure and lateralization and ultimately inset the infraspinatus in a anatomic footprint, the supraspinatus and subscapularis to the greater tuberosity.  We incorporated the biceps tenodesis into the most anterior anchor with a suture that was placed in the infraspinatus and supraspinatus that was brought anteriorly to help support the remaining intact tendon, so it would not have a tendency to retract posteriorly.  A very satisfactory repair was created recreating the rotator cuff cable as described by [place].  We then replaced the scope in the glenohumeral joint and documented the repair with multiple digital images and removed the remnant of the biceps tendon by placing Ace arthroscopic Grabber and simply breaking it free from its attachment remaining on the glenoid, which had been thinned down to less than 1 mm.  A final bursectomy and removal of clot was accomplished, followed by thorough irrigation of the glenohumeral joint and subacromial space.  The deltoid split was then repaired with multiple figure-of-eight sutures of 0 Vicryl.  The skin was repaired with 0 and 2-0 Vicryl and the wound closed with intradermal 3-0 Prolene and Steri-Strips.  The portals were repaired with intradermal 3-0  Prolene.  A voluminous gauze dressing was applied with ABD pads, and paper tape.  There were no apparent complications.  Crystal Hobbs tolerated the surgery and anesthesia well.  She will be admitted to the recovery care center for a 23-hour observation of her glucose, pain management, support of her O2 saturation, and general observation.     Crystal Fitch Kissy Cielo, M.D.     RVS/MEDQ  D:  02/28/2013  T:  03/01/2013  Job:  [id number]  cc:   Dario Guardian, M.D. Crystal Hobbs, M.D.

## 2013-03-02 ENCOUNTER — Encounter (HOSPITAL_BASED_OUTPATIENT_CLINIC_OR_DEPARTMENT_OTHER): Payer: Self-pay | Admitting: Orthopedic Surgery

## 2013-05-04 ENCOUNTER — Other Ambulatory Visit: Payer: Self-pay | Admitting: *Deleted

## 2013-05-04 DIAGNOSIS — E1165 Type 2 diabetes mellitus with hyperglycemia: Secondary | ICD-10-CM | POA: Insufficient documentation

## 2013-05-04 DIAGNOSIS — E785 Hyperlipidemia, unspecified: Secondary | ICD-10-CM

## 2013-05-08 ENCOUNTER — Other Ambulatory Visit (INDEPENDENT_AMBULATORY_CARE_PROVIDER_SITE_OTHER): Payer: BC Managed Care – PPO

## 2013-05-08 DIAGNOSIS — E785 Hyperlipidemia, unspecified: Secondary | ICD-10-CM

## 2013-05-08 DIAGNOSIS — IMO0001 Reserved for inherently not codable concepts without codable children: Secondary | ICD-10-CM

## 2013-05-08 LAB — MICROALBUMIN / CREATININE URINE RATIO
Creatinine,U: 123.1 mg/dL
Microalb, Ur: 22.6 mg/dL — ABNORMAL HIGH (ref 0.0–1.9)

## 2013-05-08 LAB — URINALYSIS, ROUTINE W REFLEX MICROSCOPIC
Bilirubin Urine: NEGATIVE
Hgb urine dipstick: NEGATIVE
Leukocytes, UA: NEGATIVE
Nitrite: NEGATIVE
Total Protein, Urine: 30
WBC, UA: NONE SEEN (ref 0–?)
pH: 5.5 (ref 5.0–8.0)

## 2013-05-08 LAB — COMPREHENSIVE METABOLIC PANEL
Albumin: 3.7 g/dL (ref 3.5–5.2)
Alkaline Phosphatase: 87 U/L (ref 39–117)
BUN: 22 mg/dL (ref 6–23)
CO2: 27 mEq/L (ref 19–32)
Calcium: 9.6 mg/dL (ref 8.4–10.5)
Chloride: 108 mEq/L (ref 96–112)
Glucose, Bld: 129 mg/dL — ABNORMAL HIGH (ref 70–99)
Potassium: 3.8 mEq/L (ref 3.5–5.1)
Sodium: 138 mEq/L (ref 135–145)
Total Protein: 7.3 g/dL (ref 6.0–8.3)

## 2013-05-08 LAB — LIPID PANEL
Cholesterol: 205 mg/dL — ABNORMAL HIGH (ref 0–200)
HDL: 36.1 mg/dL — ABNORMAL LOW
Total CHOL/HDL Ratio: 6
Triglycerides: 224 mg/dL — ABNORMAL HIGH (ref 0.0–149.0)
VLDL: 44.8 mg/dL — ABNORMAL HIGH (ref 0.0–40.0)

## 2013-05-08 LAB — HEMOGLOBIN A1C: Hgb A1c MFr Bld: 7 % — ABNORMAL HIGH (ref 4.6–6.5)

## 2013-05-08 LAB — LDL CHOLESTEROL, DIRECT: Direct LDL: 121.2 mg/dL

## 2013-05-10 ENCOUNTER — Ambulatory Visit (INDEPENDENT_AMBULATORY_CARE_PROVIDER_SITE_OTHER): Payer: BC Managed Care – PPO | Admitting: Endocrinology

## 2013-05-10 ENCOUNTER — Encounter: Payer: Self-pay | Admitting: Endocrinology

## 2013-05-10 VITALS — BP 134/70 | HR 78 | Temp 98.5°F | Resp 12 | Ht 66.5 in | Wt 199.2 lb

## 2013-05-10 DIAGNOSIS — E039 Hypothyroidism, unspecified: Secondary | ICD-10-CM

## 2013-05-10 DIAGNOSIS — I10 Essential (primary) hypertension: Secondary | ICD-10-CM

## 2013-05-10 DIAGNOSIS — E785 Hyperlipidemia, unspecified: Secondary | ICD-10-CM

## 2013-05-10 DIAGNOSIS — IMO0001 Reserved for inherently not codable concepts without codable children: Secondary | ICD-10-CM

## 2013-05-10 NOTE — Progress Notes (Signed)
Patient ID: Crystal Hobbs, female   DOB: [birth year], 62 y.o.   MRN: [id number]  Crystal Hobbs is an 62 y.o. female.   Reason for Appointment: Diabetes follow-up   History of Present Illness   Diagnosis: Type 2 DIABETES MELITUS, date of diagnosis:  2011   Past history: Her initial presentation with diabetes was with significant symptoms and glucose of 947 Initially was treated with insulin and subsequently on metformin which she could not tolerate  Since she was not responding to Victoza and had significant hyperglycemia she was started on basal bolus insulin regimen instead  However had required relatively large doses of insulin with inadequate control and blood sugars subsequently improved significantly with adding Victoza in 10/13   RECENT history: Her last A1c was 6.4 in April and she had overall good control with minimal hypoglycemia She has periodically been able to adjust her insulin to help keep the blood sugar in control Recently has been checking blood sugars mostly in the mornings and he seemed to be relatively higher  She thinks most of her high readings are because of lack of exercise until recently   She has not adjusted her Lantus even though her blood sugars are averaging nearly 150 in the morning       Side effects from medications:  diarrhea from metformin,? Hair loss from Actos Insulin regimen: Lantus 28 at bedtime, Apidra 20 before breakfast, 18 before lunch and 20 before supper           Proper timing of medications in relation to meals: Yes.         Monitors blood glucose: Once a day.    Glucometer: One Touch.          Blood Glucose readings from meter download: readings before breakfast:  Hypoglycemia frequency: Never.          Meals: 3 meals per day.          Physical activity: exercise: walking some recently            Diabetes education visit: Most recent: 05/2012           Complications: are: None      Wt Readings from Last 3 Encounters:  05/10/13 199 lb 3.2  oz (90.357 kg)  02/28/13 193 lb 4 oz (87.658 kg)  02/28/13 193 lb 4 oz (87.658 kg)    Appointment on 05/08/2013  Component Date Value Range Status  . Hemoglobin A1C 05/08/2013 7.0* 4.6 - 6.5 % Final   Glycemic Control Guidelines for People with Diabetes:Non Diabetic:  <6%Goal of Therapy: <7%Additional Action Suggested:  >8%   . Sodium 05/08/2013 138  135 - 145 mEq/L Final  . Potassium 05/08/2013 3.8  3.5 - 5.1 mEq/L Final  . Chloride 05/08/2013 108  96 - 112 mEq/L Final  . CO2 05/08/2013 27  19 - 32 mEq/L Final  . Glucose, Bld 05/08/2013 129* 70 - 99 mg/dL Final  . BUN 40/98/1191 22  6 - 23 mg/dL Final  . Creatinine, Ser 05/08/2013 0.9  0.4 - 1.2 mg/dL Final  . Total Bilirubin 05/08/2013 0.5  0.3 - 1.2 mg/dL Final  . Alkaline Phosphatase 05/08/2013 87  39 - 117 U/L Final  . AST 05/08/2013 18  0 - 37 U/L Final  . ALT 05/08/2013 24  0 - 35 U/L Final  . Total Protein 05/08/2013 7.3  6.0 - 8.3 g/dL Final  . Albumin 47/82/9562 3.7  3.5 - 5.2 g/dL Final  . Calcium 13/04/6577  9.6  8.4 - 10.5 mg/dL Final  . GFR 16/06/9603 66.89  >60.00 mL/min Final  . Microalb, Ur 05/08/2013 22.6* 0.0 - 1.9 mg/dL Final  . Creatinine,U 54/05/8118 123.1   Final  . Microalb Creat Ratio 05/08/2013 18.4  0.0 - 30.0 mg/g Final  . Cholesterol 05/08/2013 205* 0 - 200 mg/dL Final   ATP III Classification       Desirable:  < 200 mg/dL               Borderline High:  200 - 239 mg/dL          High:  > = 147 mg/dL  . Triglycerides 05/08/2013 224.0* 0.0 - 149.0 mg/dL Final   Normal:  <829 mg/dLBorderline High:  150 - 199 mg/dL  . HDL 05/08/2013 36.10* >39.00 mg/dL Final  . VLDL 56/21/3086 44.8* 0.0 - 40.0 mg/dL Final  . Total CHOL/HDL Ratio 05/08/2013 6   Final                  Men          Women1/2 Average Risk     3.4          3.3Average Risk          5.0          4.42X Average Risk          9.6          7.13X Average Risk          15.0          11.0                      . Color, Urine 05/08/2013 LT. YELLOW   Yellow;Lt. Yellow Final  . APPearance 05/08/2013 CLEAR  Clear Final  . Specific Gravity, Urine 05/08/2013 >=1.030  1.000 - 1.030 Final  . pH 05/08/2013 5.5  5.0 - 8.0 Final  . Total Protein, Urine 05/08/2013 30  Negative Final  . Urine Glucose 05/08/2013 NEGATIVE  Negative Final  . Ketones, ur 05/08/2013 NEGATIVE  Negative Final  . Bilirubin Urine 05/08/2013 NEGATIVE  Negative Final  . Hgb urine dipstick 05/08/2013 NEGATIVE  Negative Final  . Urobilinogen, UA 05/08/2013 0.2  0.0 - 1.0 Final  . Leukocytes, UA 05/08/2013 NEGATIVE  Negative Final  . Nitrite 05/08/2013 NEGATIVE  Negative Final  . WBC, UA 05/08/2013 none seen  0-2/hpf Final  . RBC / HPF 05/08/2013 none seen  0-2/hpf Final  . Squamous Epithelial / LPF 05/08/2013 Rare(0-4/hpf)  Rare(0-4/hpf) Final  . Direct LDL 05/08/2013 121.2   Final   Optimal:  <100 mg/dLNear or Above Optimal:  100-129 mg/dLBorderline High:  130-159 mg/dLHigh:  160-189 mg/dLVery High:  >190 mg/dL      Medication List       This list is accurate as of: 05/10/13  2:33 PM.  Always use your most recent med list.               APIDRA SOLOSTAR [place]  Inject 18 Units into the skin 3 (three) times daily.     aspirin 81 MG tablet  Take 81 mg by mouth QID.     candesartan 32 MG tablet  Commonly known as:  ATACAND  Take 32 mg by mouth daily.     cephALEXin 500 MG capsule  Commonly known as:  KEFLEX  Take 1 capsule (500 mg total) by mouth 3 (three) times daily.     HYDROmorphone 2 MG  tablet  Commonly known as:  DILAUDID  1 or 2 tabs every 4 hours as needed for pain     insulin glargine 100 UNIT/ML injection  Commonly known as:  LANTUS  Inject 28 Units into the skin at bedtime.     levothyroxine 50 MCG tablet  Commonly known as:  SYNTHROID, LEVOTHROID  Take 50 mcg by mouth daily before breakfast.     omega-3 acid ethyl esters 1 G capsule  Commonly known as:  LOVAZA  Take 2 g by mouth 2 (two) times daily.     simvastatin 10 MG tablet  Commonly  known as:  ZOCOR  Take 10 mg by mouth at bedtime.     VICTOZA 18 MG/3ML Sopn  Generic drug:  Liraglutide  Inject 1.2 mg into the skin.        Allergies:  Allergies  Allergen Reactions  . Actos [Pioglitazone]     hairloss  . Metformin And Related Diarrhea  . Novocain [Procaine]     syncopy  . Sulfa Antibiotics Itching    Past Medical History  Diagnosis Date  . Diabetes mellitus without complication   . Hypertension   . Coronary artery disease   . Dyslipidemia   . Vertigo   . Complication of anesthesia     takes along time to wake up  . Arthritis   . Contact lens/glasses fitting     wears glasses or contacts  . Snores   . Hypothyroidism     Past Surgical History  Procedure Laterality Date  . Cardiac catheterization  2008    stent  . Shoulder arthroscopy with open rotator cuff repair Right 02/28/2013    Procedure: right shoulder arthroscopy, distal clavicle sculpting, open rotator cuff repair;  Surgeon: Wyn Forster., MD;  Location: [facility];  Service: Orthopedics;  Laterality: Right;    No family history on file.  Social History:  reports that she has never smoked. She does not have any smokeless tobacco history on file. She reports that  drinks alcohol. She reports that she does not use illicit drugs.  Review of Systems:  HYPERTENSION:  well controlled with [place]  HYPERLIPIDEMIA: The lipid abnormality consists of elevated LDL, now taking simvastatin low dose, followed by cardiologist. Unable to take full doses because of muscle aches History of mild hypothyroidism, last TSH normal in 1/14     Examination:   BP 134/70  Pulse 78  Temp(Src) 98.5 F (36.9 C)  Resp 12  Ht 5' 6.5" (1.689 m)  Wt 199 lb 3.2 oz (90.357 kg)  BMI 31.67 kg/m2  SpO2 96%  Body mass index is 31.67 kg/(m^2).   ASSESSMENT/ PLAN::   Diabetes type 2   The patient's diabetes control appears to be relatively worse at A1c now 7% compared to 6.4% previously She  is usually doing well when she is exercising but has not been a regular dose of because of shoulder surgery She has also gained weight with not exercising She will increase her Lantus by 2 units at least to keep morning sugar is under 130, discussed how to titrate this Also reminded her to check more readings after meals to help adjust Apidra, discussed postprandial blood sugar targets She will also adjust her Apidra based on meal size. Discussed checking postprandial readings especially with high fat meal since she may need additional correction doses She will continue Victoza which has helped her previously in addition to insulin   HYPERLIPIDEMIA: This is being managed by  cardiologist, currently not at target LDL, will forward information to her  Counseling time over 50% of today's 25 minute visit   Kalid Ghan 05/10/2013, 2:33 PM

## 2013-05-10 NOTE — Patient Instructions (Addendum)
Please check blood sugars at least half the time about 2 hours after any meal and as directed on waking up. Please bring blood sugar monitor to each visit  Lantus 30 at least to keep am glucose < 130 (adjust every 3 days)

## 2013-05-16 ENCOUNTER — Encounter: Payer: Self-pay | Admitting: Endocrinology

## 2013-05-16 DIAGNOSIS — E039 Hypothyroidism, unspecified: Secondary | ICD-10-CM | POA: Insufficient documentation

## 2013-06-10 ENCOUNTER — Other Ambulatory Visit: Payer: Self-pay | Admitting: Cardiology

## 2013-06-10 DIAGNOSIS — Z79899 Other long term (current) drug therapy: Secondary | ICD-10-CM

## 2013-06-10 DIAGNOSIS — E7800 Pure hypercholesterolemia, unspecified: Secondary | ICD-10-CM

## 2013-06-19 ENCOUNTER — Other Ambulatory Visit: Payer: Self-pay | Admitting: *Deleted

## 2013-06-19 ENCOUNTER — Other Ambulatory Visit: Payer: BC Managed Care – PPO

## 2013-06-19 MED ORDER — INSULIN GLARGINE 100 UNIT/ML ~~LOC~~ SOLN: 28.0000 [IU] | Freq: Every day | SUBCUTANEOUS | Status: DC

## 2013-06-20 ENCOUNTER — Other Ambulatory Visit (INDEPENDENT_AMBULATORY_CARE_PROVIDER_SITE_OTHER): Payer: BC Managed Care – PPO

## 2013-06-20 ENCOUNTER — Telehealth: Payer: Self-pay | Admitting: Pharmacist

## 2013-06-20 DIAGNOSIS — Z79899 Other long term (current) drug therapy: Secondary | ICD-10-CM

## 2013-06-20 DIAGNOSIS — E785 Hyperlipidemia, unspecified: Secondary | ICD-10-CM

## 2013-06-20 DIAGNOSIS — E7800 Pure hypercholesterolemia, unspecified: Secondary | ICD-10-CM

## 2013-06-20 LAB — LIPID PANEL
HDL: 36.1 mg/dL — ABNORMAL LOW (ref >39.00)
Total CHOL/HDL Ratio: 5
VLDL: 31 mg/dL (ref 0.0–40.0)

## 2013-06-20 LAB — ALT: ALT: 28 U/L (ref 0–35)

## 2013-06-20 MED ORDER — SIMVASTATIN 40 MG PO TABS: 40.0000 mg | ORAL_TABLET | Freq: Every day | ORAL | Status: DC

## 2013-06-20 NOTE — Telephone Encounter (Signed)
RF:  CAD, Diabetes, HTN, age - LDL goal < 70, non-HDL goal < 100. Meds:  Lovaza 4 g/d, simvastatin 10 mg qd. Crestor was changed to lipitor due to insurance no longer covering Crestor.  Then Lipitor changed to simvastatin due to leg aches on lipitor. Patient tolerated up to simvastatin 40 mg qd without problem, however her dose was later reduced to 10 mg qd.  Not sure reason behind dose reduction, but patient states she thinks it was b/c cholesterol got real low.  Well given LDL of 110 mg/dL, and non-HDL of 045 mg/dL, patient needs to be on more potent statin.  She has been tolerating, and been compliant with simvastatin. ALT normal. Plan: 1.  Increase simvastatin to 40 mg qd. 2.  Continue all other meds. 3.  Recheck lipid panel and hepatic panel in 3 months (10/04/12)   Lab work set up, and rx sent to pharmacy.

## 2013-07-21 ENCOUNTER — Other Ambulatory Visit (INDEPENDENT_AMBULATORY_CARE_PROVIDER_SITE_OTHER): Payer: BC Managed Care – PPO

## 2013-07-21 DIAGNOSIS — Z79899 Other long term (current) drug therapy: Secondary | ICD-10-CM

## 2013-07-21 DIAGNOSIS — E785 Hyperlipidemia, unspecified: Secondary | ICD-10-CM

## 2013-07-21 LAB — LIPID PANEL: Cholesterol: 195 mg/dL (ref 0–200)

## 2013-07-21 LAB — HEPATIC FUNCTION PANEL
ALT: 28 U/L (ref 0–35)
AST: 23 U/L (ref 0–37)
Bilirubin, Direct: 0 mg/dL (ref 0.0–0.3)
Total Bilirubin: 0.5 mg/dL (ref 0.3–1.2)
Total Protein: 7.4 g/dL (ref 6.0–8.3)

## 2013-07-26 ENCOUNTER — Telehealth: Payer: Self-pay | Admitting: Pharmacist

## 2013-07-26 DIAGNOSIS — E785 Hyperlipidemia, unspecified: Secondary | ICD-10-CM

## 2013-07-26 DIAGNOSIS — Z79899 Other long term (current) drug therapy: Secondary | ICD-10-CM

## 2013-07-26 MED ORDER — SIMVASTATIN 40 MG PO TABS: 40.0000 mg | ORAL_TABLET | Freq: Every day | ORAL | Status: DC

## 2013-07-26 NOTE — Telephone Encounter (Signed)
Patient has h/o CAD, Diabetes, and HTN.  She was on simvastatin 10 mg last month, and due to LDL of 110 mg/dL at that time, she was told to increase to simvastatin 40 mg qd.  Insurance wouldn't cover Crestor, and failed lipitor due to myalgias.  LDL has gone up since increasing simvastatin from 10 mg up to 40 mg qd.  Spoke with patient and she states last week when she had her blood work done life was "crazy" for her, and she possibly missed a dose or more prior to blood work.  We will continue simvastatin 40 mg for 2 more months, and if LDL remains elevated at that time, will consider changing to Vytorin 10/40 mg qd (will likely have results from [facility] at that time as well).  Recheck panel 10/04/13.  Patient notified and shows verbal understanding of plan.

## 2013-07-26 NOTE — Progress Notes (Signed)
agree

## 2013-07-27 ENCOUNTER — Other Ambulatory Visit (INDEPENDENT_AMBULATORY_CARE_PROVIDER_SITE_OTHER): Payer: BC Managed Care – PPO

## 2013-07-27 DIAGNOSIS — E039 Hypothyroidism, unspecified: Secondary | ICD-10-CM

## 2013-07-27 LAB — BASIC METABOLIC PANEL
BUN: 22 mg/dL (ref 6–23)
CO2: 27 mEq/L (ref 19–32)
Chloride: 104 mEq/L (ref 96–112)
Glucose, Bld: 120 mg/dL — ABNORMAL HIGH (ref 70–99)
Potassium: 4 mEq/L (ref 3.5–5.1)

## 2013-08-03 ENCOUNTER — Ambulatory Visit: Payer: BC Managed Care – PPO | Admitting: Endocrinology

## 2013-08-03 ENCOUNTER — Encounter: Payer: Self-pay | Admitting: *Deleted

## 2013-08-03 DIAGNOSIS — Z0289 Encounter for other administrative examinations: Secondary | ICD-10-CM

## 2013-08-05 ENCOUNTER — Encounter: Payer: Self-pay | Admitting: Cardiology

## 2013-08-08 ENCOUNTER — Encounter: Payer: Self-pay | Admitting: Cardiology

## 2013-08-08 ENCOUNTER — Ambulatory Visit (INDEPENDENT_AMBULATORY_CARE_PROVIDER_SITE_OTHER): Payer: BC Managed Care – PPO | Admitting: Cardiology

## 2013-08-08 VITALS — BP 130/80 | HR 76 | Ht 66.5 in | Wt 196.1 lb

## 2013-08-08 DIAGNOSIS — I2510 Atherosclerotic heart disease of native coronary artery without angina pectoris: Secondary | ICD-10-CM

## 2013-08-08 DIAGNOSIS — I10 Essential (primary) hypertension: Secondary | ICD-10-CM

## 2013-08-08 DIAGNOSIS — E785 Hyperlipidemia, unspecified: Secondary | ICD-10-CM

## 2013-08-08 NOTE — Progress Notes (Signed)
[address] [place], [place]  [address] Phone: [phone] Fax:  [phone]  Date:  08/08/2013   ID:  Crystal Hobbs, DOB [birth year], MRN [id number]  PCP:  Allean Found, MD  Cardiologist:  Armanda Magic, MD     History of Present Illness: Crystal Hobbs is a 62 y.o. female with a history of ASCAD, HTN and dyslipidemia who presents today for followup.  She denies any chest pain, SOB, DOE, dizziness, palpitations or syncope.  She rarely has some mild LE edema.   Wt Readings from Last 3 Encounters:  08/08/13 196 lb 1.9 oz (88.959 kg)  05/10/13 199 lb 3.2 oz (90.357 kg)  02/28/13 193 lb 4 oz (87.658 kg)     Past Medical History  Diagnosis Date  . Diabetes mellitus without complication   . Dyslipidemia   . Vertigo   . Complication of anesthesia     takes along time to wake up  . Arthritis   . Contact lens/glasses fitting     wears glasses or contacts  . Snores   . Hypothyroidism   . Coronary artery disease 01/2007    s/p PCI of diagonal   . Hyperlipidemia   . Hypertension     Current Outpatient Prescriptions  Medication Sig Dispense Refill  . aspirin 81 MG tablet Take 81 mg by mouth QID.       Marland Kitchen candesartan (ATACAND) 32 MG tablet Take 32 mg by mouth daily.      . insulin glargine (LANTUS) 100 UNIT/ML injection Inject 0.28 mLs (28 Units total) into the skin at bedtime.  5 pen  5  . Insulin Glulisine (APIDRA SOLOSTAR [place]) Inject 18 Units into the skin 3 (three) times daily.      . Liraglutide (VICTOZA) 18 MG/3ML SOPN Inject 1.2 mg into the skin.      Marland Kitchen omega-3 acid ethyl esters (LOVAZA) 1 G capsule Take 2 g by mouth 2 (two) times daily.      . simvastatin (ZOCOR) 40 MG tablet Take 1 tablet (40 mg total) by mouth at bedtime.  90 tablet  3  . levothyroxine (SYNTHROID, LEVOTHROID) 50 MCG tablet Take 50 mcg by mouth daily before breakfast.       No current facility-administered medications for this visit.    Allergies:    Allergies  Allergen Reactions    . Actos [Pioglitazone]     hairloss  . Metformin And Related Diarrhea  . Novocain [Procaine]     syncopy  . Sulfa Antibiotics Itching    Social History:  The patient  reports that she has never smoked. She does not have any smokeless tobacco history on file. She reports that she drinks alcohol. She reports that she does not use illicit drugs.   Family History:  The patient's family history is not on file.   ROS:  Please see the history of present illness.      All other systems reviewed and negative.   PHYSICAL EXAM: VS:  BP 130/80  Pulse 76  Ht 5' 6.5" (1.689 m)  Wt 196 lb 1.9 oz (88.959 kg)  BMI 31.18 kg/m2 Well nourished, well developed, in no acute distress HEENT: normal Neck: no JVD Cardiac:  normal S1, S2; RRR; no murmur Lungs:  clear to auscultation bilaterally, no wheezing, rhonchi or rales Abd: soft, nontender, no hepatomegaly Ext: no edema Skin: warm and dry Neuro:  CNs 2-12 intact, no focal abnormalities noted       ASSESSMENT AND  PLAN:  1. ASCAD with no angina  - continue ASA 2. HTN - BP well controlled  - continue Candesartan 3. Dyslipidemia - her last lipids showed an LDL of 119 and HDL 37  - She is scheduled for repeat lipids 09/2013  - continue simvastatin  Followup with me in 6 months  Signed, Armanda Magic, MD 08/08/2013 9:37 AM

## 2013-08-08 NOTE — Patient Instructions (Signed)
Your physician recommends that you continue on your current medications as directed. Please refer to the Current Medication list given to you today.  Your physician wants you to follow-up in: 6 Months with Dr Turner You will receive a reminder letter in the mail two months in advance. If you don't receive a letter, please call our office to schedule the follow-up appointment.  

## 2013-09-19 ENCOUNTER — Other Ambulatory Visit (INDEPENDENT_AMBULATORY_CARE_PROVIDER_SITE_OTHER): Payer: BC Managed Care – PPO

## 2013-09-19 DIAGNOSIS — Z79899 Other long term (current) drug therapy: Secondary | ICD-10-CM

## 2013-09-19 DIAGNOSIS — E785 Hyperlipidemia, unspecified: Secondary | ICD-10-CM

## 2013-09-19 LAB — LIPID PANEL
CHOLESTEROL: 146 mg/dL (ref 0–200)
HDL: 37.4 mg/dL — AB (ref >39.00)
LDL Cholesterol: 81 mg/dL (ref 0–99)
TRIGLYCERIDES: 136 mg/dL (ref 0.0–149.0)
Total CHOL/HDL Ratio: 4
VLDL: 27.2 mg/dL (ref 0.0–40.0)

## 2013-09-19 LAB — HEPATIC FUNCTION PANEL
ALBUMIN: 3.8 g/dL (ref 3.5–5.2)
ALK PHOS: 92 U/L (ref 39–117)
ALT: 26 U/L (ref 0–35)
AST: 21 U/L (ref 0–37)
Bilirubin, Direct: 0 mg/dL (ref 0.0–0.3)
Total Bilirubin: 0.4 mg/dL (ref 0.3–1.2)
Total Protein: 7.3 g/dL (ref 6.0–8.3)

## 2013-09-25 ENCOUNTER — Encounter: Payer: Self-pay | Admitting: General Surgery

## 2013-09-25 ENCOUNTER — Telehealth: Payer: Self-pay | Admitting: General Surgery

## 2013-09-25 DIAGNOSIS — E785 Hyperlipidemia, unspecified: Secondary | ICD-10-CM

## 2013-09-25 MED ORDER — EZETIMIBE-SIMVASTATIN 10-40 MG PO TABS: 1.0000 | ORAL_TABLET | Freq: Every day | ORAL | Status: DC

## 2013-09-25 NOTE — Telephone Encounter (Signed)
Message copied by Lily Kocher on Mon Sep 25, 2013 10:37 AM ------      Message from: SMART, Maralyn Sago      Created: Wed Sep 20, 2013  8:42 AM       RF:  CAD, Diabetes, HTN, age - LDL goal < 70, non-HDL < 100      Meds:  Simvastatin 40 mg qd.      LDL improved from 119 down to 81 mg/dL over past few months.      Insurance wouldn't cover Crestor, and failed lipitor due to myalgias.      LDL and non-HDL improved, but still above goal given CAD and Diabetes, so would like to add Zetia to the simvastatin if possible (in form of Vytorin).      Plan:      1.  Change simvastatin 40 mg to Vytorin 10/40 mg qd.  Okay to give her a voucher for free month or discounted cost if we have it.      2.  Recheck lipid panel and hepatic panel 3 months later.        Please notify patient, update meds, and set up labs. Thanks. ------

## 2013-09-25 NOTE — Telephone Encounter (Signed)
I put a months supply of samples of Vytorin up front for pt as well as a voucher for a free 30 day supply for pt at pharmacy. Pt is aware and is coming to pick up today.

## 2013-09-29 ENCOUNTER — Ambulatory Visit: Payer: BC Managed Care – PPO | Admitting: Endocrinology

## 2013-10-02 ENCOUNTER — Other Ambulatory Visit (INDEPENDENT_AMBULATORY_CARE_PROVIDER_SITE_OTHER): Payer: BC Managed Care – PPO

## 2013-10-02 ENCOUNTER — Other Ambulatory Visit: Payer: Self-pay | Admitting: *Deleted

## 2013-10-02 ENCOUNTER — Other Ambulatory Visit: Payer: BC Managed Care – PPO

## 2013-10-02 DIAGNOSIS — IMO0001 Reserved for inherently not codable concepts without codable children: Secondary | ICD-10-CM

## 2013-10-02 DIAGNOSIS — E039 Hypothyroidism, unspecified: Secondary | ICD-10-CM

## 2013-10-02 DIAGNOSIS — E1165 Type 2 diabetes mellitus with hyperglycemia: Principal | ICD-10-CM

## 2013-10-02 DIAGNOSIS — E785 Hyperlipidemia, unspecified: Secondary | ICD-10-CM

## 2013-10-02 LAB — HEMOGLOBIN A1C: Hgb A1c MFr Bld: 7.1 % — ABNORMAL HIGH (ref 4.6–6.5)

## 2013-10-02 LAB — T4, FREE: FREE T4: 0.8 ng/dL (ref 0.60–1.60)

## 2013-10-02 LAB — COMPREHENSIVE METABOLIC PANEL
ALBUMIN: 3.9 g/dL (ref 3.5–5.2)
ALT: 24 U/L (ref 0–35)
AST: 21 U/L (ref 0–37)
Alkaline Phosphatase: 96 U/L (ref 39–117)
BUN: 26 mg/dL — ABNORMAL HIGH (ref 6–23)
CHLORIDE: 105 meq/L (ref 96–112)
CO2: 26 mEq/L (ref 19–32)
CREATININE: 1.2 mg/dL (ref 0.4–1.2)
Calcium: 9.9 mg/dL (ref 8.4–10.5)
GFR: 49.85 mL/min — ABNORMAL LOW (ref >60.00)
Glucose, Bld: 96 mg/dL (ref 70–99)
POTASSIUM: 4.1 meq/L (ref 3.5–5.1)
Sodium: 141 mEq/L (ref 135–145)
Total Bilirubin: 0.6 mg/dL (ref 0.3–1.2)
Total Protein: 7.9 g/dL (ref 6.0–8.3)

## 2013-10-02 LAB — TSH: TSH: 4.37 u[IU]/mL (ref 0.35–5.50)

## 2013-10-03 ENCOUNTER — Other Ambulatory Visit (INDEPENDENT_AMBULATORY_CARE_PROVIDER_SITE_OTHER): Payer: BC Managed Care – PPO

## 2013-10-03 DIAGNOSIS — E785 Hyperlipidemia, unspecified: Secondary | ICD-10-CM

## 2013-10-03 LAB — MICROALBUMIN / CREATININE URINE RATIO
Creatinine,U: 54.8 mg/dL
MICROALB/CREAT RATIO: 11.3 mg/g (ref 0.0–30.0)
Microalb, Ur: 6.2 mg/dL — ABNORMAL HIGH (ref 0.0–1.9)

## 2013-10-03 LAB — URINALYSIS
Bilirubin Urine: NEGATIVE
Hgb urine dipstick: NEGATIVE
Ketones, ur: NEGATIVE
Leukocytes, UA: NEGATIVE
NITRITE: NEGATIVE
Specific Gravity, Urine: 1.01 (ref 1.000–1.030)
TOTAL PROTEIN, URINE-UPE24: NEGATIVE
URINE GLUCOSE: NEGATIVE
Urobilinogen, UA: 0.2 (ref 0.0–1.0)
pH: 5.5 (ref 5.0–8.0)

## 2013-10-04 ENCOUNTER — Other Ambulatory Visit: Payer: Self-pay | Admitting: General Surgery

## 2013-10-04 ENCOUNTER — Other Ambulatory Visit: Payer: BC Managed Care – PPO

## 2013-10-04 LAB — LIPID PANEL
CHOLESTEROL: 113 mg/dL (ref 0–200)
HDL: 37.4 mg/dL — AB (ref >39.00)
LDL Cholesterol: 49 mg/dL (ref 0–99)
Total CHOL/HDL Ratio: 3
Triglycerides: 132 mg/dL (ref 0.0–149.0)
VLDL: 26.4 mg/dL (ref 0.0–40.0)

## 2013-10-04 LAB — HEPATIC FUNCTION PANEL
ALT: 24 U/L (ref 0–35)
AST: 25 U/L (ref 0–37)
Albumin: 3.9 g/dL (ref 3.5–5.2)
Alkaline Phosphatase: 98 U/L (ref 39–117)
Bilirubin, Direct: 0 mg/dL (ref 0.0–0.3)
TOTAL PROTEIN: 7.8 g/dL (ref 6.0–8.3)
Total Bilirubin: 0.5 mg/dL (ref 0.3–1.2)

## 2013-10-04 MED ORDER — LOSARTAN POTASSIUM 100 MG PO TABS: 100.0000 mg | ORAL_TABLET | Freq: Every day | ORAL | Status: DC

## 2013-10-05 ENCOUNTER — Encounter: Payer: Self-pay | Admitting: Endocrinology

## 2013-10-05 ENCOUNTER — Encounter: Payer: Self-pay | Admitting: General Surgery

## 2013-10-05 ENCOUNTER — Ambulatory Visit (INDEPENDENT_AMBULATORY_CARE_PROVIDER_SITE_OTHER): Payer: BC Managed Care – PPO | Admitting: Endocrinology

## 2013-10-05 ENCOUNTER — Other Ambulatory Visit: Payer: Self-pay | Admitting: General Surgery

## 2013-10-05 VITALS — BP 122/76 | HR 72 | Temp 98.2°F | Resp 16 | Ht 66.5 in | Wt 200.5 lb

## 2013-10-05 DIAGNOSIS — E785 Hyperlipidemia, unspecified: Secondary | ICD-10-CM

## 2013-10-05 DIAGNOSIS — IMO0001 Reserved for inherently not codable concepts without codable children: Secondary | ICD-10-CM

## 2013-10-05 DIAGNOSIS — E039 Hypothyroidism, unspecified: Secondary | ICD-10-CM

## 2013-10-05 DIAGNOSIS — E1165 Type 2 diabetes mellitus with hyperglycemia: Principal | ICD-10-CM

## 2013-10-05 NOTE — Patient Instructions (Addendum)
Please check blood sugars at least half the time about 2 hours after any meal and as directed on waking up. Please bring blood sugar monitor to each visit  New Rx for Synthroid

## 2013-10-05 NOTE — Progress Notes (Signed)
Patient ID: Crystal BrazilBetty A Ruppert, female   DOB: Jun 26, 1951, 63 y.o.   MRN: [id number]   Reason for Appointment: Diabetes follow-up   History of Present Illness   Diagnosis: Type 2 DIABETES MELITUS, date of diagnosis:  2011   Past history: Her initial presentation with diabetes was with significant symptoms and glucose of 947 Initially was treated with insulin and subsequently on metformin which she could not tolerate  Since she was not responding to Victoza and had significant hyperglycemia she was started on basal bolus insulin regimen instead  However had required relatively large doses of insulin with inadequate control and blood sugars subsequently improved significantly with adding Victoza in 10/13  Her  A1c was 6.4 in 4/14 and she had overall good control with minimal hypoglycemia  RECENT history:  Recently has been checking blood sugars mostly in the mornings and these seem to be somewhat variable but overall fairly good She is checking her blood sugars sporadically and evenings and afternoons Most of her high readings at night are related to her forgetting to take the Apidra before supper Also may not be consistent with taking it at lunch 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 Hair loss from Actos Proper timing of medications in relation to meals: May miss dinner dose        Monitors blood glucose: Once a day.    Glucometer: One Touch.          Blood Glucose readings from meter download:   PREMEAL Breakfast Lunch Dinner Bedtime Overall  Glucose range:  96-160    101, 104   192, 209    Mean/median:  127      127    POST-MEAL PC Breakfast PC Lunch PC Dinner  Glucose range:   131, 101   112  Mean/median:      Hypoglycemia frequency: Rarely.          Meals: 3 meals per  day. Dinner 6-8 pm        Physical activity: exercise: walking now 4/7, 30-45 min., starting tennis also             Diabetes education visit: Most recent: 05/2012           Complications: are: None     Wt Readings from Last 3 Encounters:  10/05/13 200 lb 8 oz (90.946 kg)  08/08/13 196 lb 1.9 oz (88.959 kg)  05/10/13 199 lb 3.2 oz (90.357 kg)    Lab Results  Component Value Date   HGBA1C 7.1* 10/02/2013   HGBA1C 7.3* 07/27/2013   HGBA1C 7.0* 05/08/2013   Lab Results  Component Value Date   MICROALBUR 6.2* 10/02/2013   LDLCALC 49 10/03/2013   CREATININE 1.2 10/02/2013    Appointment on 10/03/2013  Component Date Value Range Status  . Cholesterol 10/03/2013 113  0 - 200 mg/dL Final   ATP III Classification       Desirable:  < 200 mg/dL               Borderline High:  200 - 239 mg/dL          High:  > = 147240 mg/dL  . Triglycerides 10/03/2013 132.0  0.0 - 149.0 mg/dL Final   Normal:  <  150 mg/dLBorderline High:  150 - 199 mg/dL  . HDL 10/03/2013 37.40* >39.00 mg/dL Final  . VLDL 21/30/8657 26.4  0.0 - 40.0 mg/dL Final  . LDL Cholesterol 10/03/2013 49  0 - 99 mg/dL Final  . Total CHOL/HDL Ratio 10/03/2013 3   Final                  Men          Women1/2 Average Risk     3.4          3.3Average Risk          5.0          4.42X Average Risk          9.6          7.13X Average Risk          15.0          11.0                      . Total Bilirubin 10/03/2013 0.5  0.3 - 1.2 mg/dL Final  . Bilirubin, Direct 10/03/2013 0.0  0.0 - 0.3 mg/dL Final  . Alkaline Phosphatase 10/03/2013 98  39 - 117 U/L Final  . AST 10/03/2013 25  0 - 37 U/L Final  . ALT 10/03/2013 24  0 - 35 U/L Final  . Total Protein 10/03/2013 7.8  6.0 - 8.3 g/dL Final  . Albumin 84/69/6295 3.9  3.5 - 5.2 g/dL Final  Appointment on 28/41/3244  Component Date Value Range Status  . Microalb, Ur 10/02/2013 6.2* 0.0 - 1.9 mg/dL Final  . Creatinine,U 09/16/7251 54.8   Final  . Microalb Creat Ratio 10/02/2013 11.3  0.0 - 30.0 mg/g  Final  . TSH 10/02/2013 4.37  0.35 - 5.50 uIU/mL Final  . Free T4 10/02/2013 0.80  0.60 - 1.60 ng/dL Final  . Sodium 66/44/0347 141  135 - 145 mEq/L Final  . Potassium 10/02/2013 4.1  3.5 - 5.1 mEq/L Final  . Chloride 10/02/2013 105  96 - 112 mEq/L Final  . CO2 10/02/2013 26  19 - 32 mEq/L Final  . Glucose, Bld 10/02/2013 96  70 - 99 mg/dL Final  . BUN 42/59/5638 26* 6 - 23 mg/dL Final  . Creatinine, Ser 10/02/2013 1.2  0.4 - 1.2 mg/dL Final  . Total Bilirubin 10/02/2013 0.6  0.3 - 1.2 mg/dL Final  . Alkaline Phosphatase 10/02/2013 96  39 - 117 U/L Final  . AST 10/02/2013 21  0 - 37 U/L Final  . ALT 10/02/2013 24  0 - 35 U/L Final  . Total Protein 10/02/2013 7.9  6.0 - 8.3 g/dL Final  . Albumin 75/64/3329 3.9  3.5 - 5.2 g/dL Final  . Calcium 51/88/4166 9.9  8.4 - 10.5 mg/dL Final  . GFR 03/13/1600 49.85* >60.00 mL/min Final  . Hemoglobin A1C 10/02/2013 7.1* 4.6 - 6.5 % Final   Glycemic Control Guidelines for People with Diabetes:Non Diabetic:  <6%Goal of Therapy: <7%Additional Action Suggested:  >8%   . Color, Urine 10/02/2013 YELLOW  Yellow;Lt. Yellow Final  . APPearance 10/02/2013 CLEAR  Clear Final  . Specific Gravity, Urine 10/02/2013 1.010  1.000-1.030 Final  . pH 10/02/2013 5.5  5.0 - 8.0 Final  . Total Protein, Urine 10/02/2013 NEGATIVE  Negative Final  . Urine Glucose 10/02/2013 NEGATIVE  Negative Final  . Ketones, ur 10/02/2013 NEGATIVE  Negative Final  . Bilirubin Urine 10/02/2013 NEGATIVE  Negative Final  .  Hgb urine dipstick 10/02/2013 NEGATIVE  Negative Final  . Urobilinogen, UA 10/02/2013 0.2  0.0 - 1.0 Final  . Leukocytes, UA 10/02/2013 NEGATIVE  Negative Final  . Nitrite 10/02/2013 NEGATIVE  Negative Final      Medication List       This list is accurate as of: 10/05/13  4:15 PM.  Always use your most recent med list.               APIDRA SOLOSTAR [place]  Inject 18 Units into the skin 3 (three) times daily.     aspirin 81 MG tablet  Take 81 mg by mouth QID.      candesartan-hydrochlorothiazide 16-12.5 MG per tablet  Commonly known as:  ATACAND HCT     ezetimibe-simvastatin 10-40 MG per tablet  Commonly known as:  VYTORIN  Take 1 tablet by mouth daily.     insulin glargine 100 UNIT/ML injection  Commonly known as:  LANTUS  Inject 34 Units into the skin at bedtime.     levothyroxine 50 MCG tablet  Commonly known as:  SYNTHROID, LEVOTHROID  Take 50 mcg by mouth daily before breakfast.     losartan 100 MG tablet  Commonly known as:  COZAAR  Take 1 tablet (100 mg total) by mouth daily.     omega-3 acid ethyl esters 1 G capsule  Commonly known as:  LOVAZA  Take 2 g by mouth 2 (two) times daily.     VICTOZA 18 MG/3ML Sopn  Generic drug:  Liraglutide  Inject 1.2 mg into the skin.        Allergies:  Allergies  Allergen Reactions  . Actos [Pioglitazone]     hairloss  . Metformin And Related Diarrhea  . Novocain [Procaine]     syncopy  . Sulfa Antibiotics Itching    Past Medical History  Diagnosis Date  . Diabetes mellitus without complication   . Dyslipidemia   . Vertigo   . Complication of anesthesia     takes along time to wake up  . Arthritis   . Contact lens/glasses fitting     wears glasses or contacts  . Snores   . Hypothyroidism   . Coronary artery disease 01/2007    s/p PCI of diagonal   . Hyperlipidemia   . Hypertension     Past Surgical History  Procedure Laterality Date  . Cardiac catheterization  2008    stent  . Shoulder arthroscopy with open rotator cuff repair Right 02/28/2013    Procedure: right shoulder arthroscopy, distal clavicle sculpting, open rotator cuff repair;  Surgeon: Cammie Sickle., MD;  Location: [facility];  Service: Orthopedics;  Laterality: Right;    No family history on file.  Social History:  reports that she has never smoked. She does not have any smokeless tobacco history on file. She reports that she drinks alcohol. She reports that she does not use  illicit drugs.  Review of Systems:  HYPERTENSION:  well controlled with Atacand  HYPERLIPIDEMIA: The lipid abnormality consists of elevated LDL, now taking Vytorin instead of simvastatin, followed by cardiologist.  Lab Results  Component Value Date   CHOL 113 10/03/2013   HDL 37.40* 10/03/2013   LDLCALC 49 10/03/2013   LDLDIRECT 121.2 05/08/2013   TRIG 132.0 10/03/2013   CHOLHDL 3 10/03/2013    History of mild hypothyroidism, previously treated by PCP but has not had any recent followup Does not complain of any unusual fatigue but her TSH is trending  higher with her 50 mcg dose  Lab Results  Component Value Date   TSH 4.37 10/02/2013   TSH 3.67 07/27/2013    No recent swelling of feet     Examination:   BP 122/76  Pulse 72  Temp(Src) 98.2 F (36.8 C)  Resp 16  Ht 5' 6.5" (1.689 m)  Wt 200 lb 8 oz (90.946 kg)  BMI 31.88 kg/m2  SpO2 95%  Body mass index is 31.88 kg/(m^2).   ASSESSMENT/ PLAN:   Diabetes type 2   The patient's diabetes control appears to  be still somewhat inadequate A1c just over  7% compared to 6.4% previously Most of her hyperglycemia appears to be from not taking her mealtime doses consistently at lunch or supper FASTING readings are variable, probably based on how blood sugar is the night before since some mornings are as low as 96  She has also gained weight with not exercising consistently Generally watching diet especially with her history of hyperlipidemia and CAD  Recommendations made today:  Take Apidra more consistently at least at suppertime before eating  If forgetting to take Apidra after supper do not take the full dose later Continue the same Lantus dose unless morning sugars are consistently out of the 90-130 range   She will continue Victoza which has helped her previously in addition to insulin   More readings after lunch and supper to help adjust her mealtime doses especially at suppertime  HYPERLIPIDEMIA: This is being managed  by cardiologist, LDL much better with using Vytorin but still has a relatively low HDL. She is starting to exercise and hopefully will lose weight to help with insulin resistance and metabolic syndrome  HYPOTHYROIDISM: Her TSH is trending higher and she is only taking 50 mcg. Will change her to 62.5 mcg. She also needs to followup with PCP for general care  Counseling time over 50% of today's 25 minute visit   Annise Boran 10/05/2013, 4:15 PM

## 2013-10-11 ENCOUNTER — Other Ambulatory Visit: Payer: Self-pay | Admitting: *Deleted

## 2013-10-11 ENCOUNTER — Telehealth: Payer: Self-pay | Admitting: Cardiology

## 2013-10-11 MED ORDER — GLUCOSE BLOOD VI STRP: ORAL_STRIP | Status: DC

## 2013-10-11 MED ORDER — LEVOTHYROXINE SODIUM 125 MCG PO TABS: ORAL_TABLET | ORAL | Status: DC

## 2013-10-11 NOTE — Telephone Encounter (Signed)
LVM for pt to return call

## 2013-10-11 NOTE — Telephone Encounter (Signed)
New problem   Pt needs a call back about med sample Zytorin and should she take Lasartin?   Please give pt back.

## 2013-10-12 ENCOUNTER — Other Ambulatory Visit: Payer: Self-pay | Admitting: *Deleted

## 2013-10-12 MED ORDER — LIRAGLUTIDE 18 MG/3ML ~~LOC~~ SOPN: 1.2000 mg | PEN_INJECTOR | Freq: Every day | SUBCUTANEOUS | Status: DC

## 2013-10-12 NOTE — Telephone Encounter (Signed)
Talked to pt and made aware.

## 2013-10-24 ENCOUNTER — Telehealth: Payer: Self-pay | Admitting: Cardiology

## 2013-10-24 ENCOUNTER — Other Ambulatory Visit: Payer: Self-pay | Admitting: *Deleted

## 2013-10-24 NOTE — Telephone Encounter (Signed)
To [employer]. Pt needs this PA done. They have sent it to [place] twice since January. Have we received this? Or are we working on this already? If not they gave me the number to call. It is [phone]

## 2013-10-24 NOTE — Telephone Encounter (Signed)
[employer] approved vytorin: "Your request has been approved  Effective from 10/24/2013 through 09/13/2038." Notified patient.

## 2013-10-24 NOTE — Telephone Encounter (Signed)
New message    Need a voucher for 10/40 vytorin.  Pt says it is for a 30da supply.  Lovett Sox in refills does not know what this is.  Pls send another voucher to her pharmacy to get this medicatin

## 2013-10-26 ENCOUNTER — Other Ambulatory Visit: Payer: Self-pay | Admitting: *Deleted

## 2013-12-08 ENCOUNTER — Other Ambulatory Visit: Payer: Self-pay | Admitting: *Deleted

## 2013-12-08 MED ORDER — INSULIN GLULISINE 100 UNIT/ML SOLOSTAR PEN: PEN_INJECTOR | SUBCUTANEOUS | Status: DC

## 2013-12-11 DIAGNOSIS — H01029 Squamous blepharitis unspecified eye, unspecified eyelid: Secondary | ICD-10-CM | POA: Diagnosis not present

## 2013-12-11 DIAGNOSIS — H1045 Other chronic allergic conjunctivitis: Secondary | ICD-10-CM | POA: Diagnosis not present

## 2013-12-11 DIAGNOSIS — E119 Type 2 diabetes mellitus without complications: Secondary | ICD-10-CM | POA: Diagnosis not present

## 2013-12-20 ENCOUNTER — Telehealth: Payer: Self-pay | Admitting: *Deleted

## 2013-12-20 ENCOUNTER — Other Ambulatory Visit: Payer: Self-pay | Admitting: *Deleted

## 2013-12-20 MED ORDER — INSULIN LISPRO 100 UNIT/ML (KWIKPEN): PEN_INJECTOR | SUBCUTANEOUS | Status: DC

## 2013-12-20 NOTE — Telephone Encounter (Signed)
Patient called her insurance company about Apidra, she said they told her Humalog was preferred. Okay to send with same directions?

## 2013-12-20 NOTE — Telephone Encounter (Signed)
Noted rx sent.

## 2013-12-20 NOTE — Telephone Encounter (Signed)
yes

## 2013-12-25 ENCOUNTER — Other Ambulatory Visit: Payer: BC Managed Care – PPO

## 2014-01-01 ENCOUNTER — Other Ambulatory Visit (INDEPENDENT_AMBULATORY_CARE_PROVIDER_SITE_OTHER): Payer: Medicare Other

## 2014-01-01 DIAGNOSIS — IMO0001 Reserved for inherently not codable concepts without codable children: Secondary | ICD-10-CM

## 2014-01-01 DIAGNOSIS — E039 Hypothyroidism, unspecified: Secondary | ICD-10-CM

## 2014-01-01 DIAGNOSIS — E1165 Type 2 diabetes mellitus with hyperglycemia: Secondary | ICD-10-CM

## 2014-01-01 LAB — HEMOGLOBIN A1C: Hgb A1c MFr Bld: 7.2 % — ABNORMAL HIGH (ref 4.6–6.5)

## 2014-01-01 LAB — COMPREHENSIVE METABOLIC PANEL
ALBUMIN: 3.5 g/dL (ref 3.5–5.2)
ALT: 22 U/L (ref 0–35)
AST: 18 U/L (ref 0–37)
Alkaline Phosphatase: 95 U/L (ref 39–117)
BUN: 16 mg/dL (ref 6–23)
CO2: 25 mEq/L (ref 19–32)
Calcium: 9.6 mg/dL (ref 8.4–10.5)
Chloride: 109 mEq/L (ref 96–112)
Creatinine, Ser: 1 mg/dL (ref 0.4–1.2)
GFR: 61.96 mL/min (ref >60.00)
GLUCOSE: 125 mg/dL — AB (ref 70–99)
POTASSIUM: 4.4 meq/L (ref 3.5–5.1)
Sodium: 142 mEq/L (ref 135–145)
TOTAL PROTEIN: 7 g/dL (ref 6.0–8.3)
Total Bilirubin: 0.5 mg/dL (ref 0.3–1.2)

## 2014-01-01 LAB — TSH: TSH: 2.72 u[IU]/mL (ref 0.35–5.50)

## 2014-01-01 LAB — T4, FREE: Free T4: 0.8 ng/dL (ref 0.60–1.60)

## 2014-01-03 ENCOUNTER — Encounter: Payer: Self-pay | Admitting: Endocrinology

## 2014-01-03 ENCOUNTER — Ambulatory Visit (INDEPENDENT_AMBULATORY_CARE_PROVIDER_SITE_OTHER): Payer: Medicare Other | Admitting: Endocrinology

## 2014-01-03 ENCOUNTER — Encounter: Payer: Medicare Other | Attending: Endocrinology | Admitting: Nutrition

## 2014-01-03 VITALS — BP 124/72 | HR 72 | Temp 98.1°F | Resp 16 | Ht 66.5 in | Wt 200.4 lb

## 2014-01-03 DIAGNOSIS — E119 Type 2 diabetes mellitus without complications: Secondary | ICD-10-CM | POA: Diagnosis not present

## 2014-01-03 DIAGNOSIS — E1165 Type 2 diabetes mellitus with hyperglycemia: Principal | ICD-10-CM

## 2014-01-03 DIAGNOSIS — IMO0001 Reserved for inherently not codable concepts without codable children: Secondary | ICD-10-CM

## 2014-01-03 DIAGNOSIS — E039 Hypothyroidism, unspecified: Secondary | ICD-10-CM

## 2014-01-03 DIAGNOSIS — Z713 Dietary counseling and surveillance: Secondary | ICD-10-CM | POA: Insufficient documentation

## 2014-01-03 DIAGNOSIS — E785 Hyperlipidemia, unspecified: Secondary | ICD-10-CM

## 2014-01-03 MED ORDER — INSULIN REGULAR HUMAN 4 & 8 UNITS IN POWD: 16.0000 [IU] | Freq: Three times a day (TID) | RESPIRATORY_TRACT | Status: DC

## 2014-01-03 NOTE — Patient Instructions (Signed)
Stop Apidra insulin.   Take 16u of Afrezza insulin (4 4mg ,m starter samples before each meal.   When you pick up the prescription, you will take 2 8mg . Packets before each meal. Call if questions

## 2014-01-03 NOTE — Progress Notes (Signed)
Patient ID: Crystal Hobbs, female   DOB: July 28, 1951, 63 y.o.   MRN: [id number]   Reason for Appointment: Diabetes follow-up   History of Present Illness   Diagnosis: Type 2 DIABETES MELITUS, date of diagnosis:  2011   Past history: Her initial presentation with diabetes was with significant symptoms and glucose of 947 Initially was treated with insulin and subsequently on metformin which she could not tolerate  Since she was not responding to Victoza and had significant hyperglycemia she was started on basal bolus insulin regimen instead  However had required relatively large doses of insulin with inadequate control and blood sugars subsequently improved significantly with adding Victoza in 10/13  Her  A1c was 6.4 in 4/14 and she had overall good control with minimal hypoglycemia  RECENT history:  Again has been checking blood sugars mostly in the mornings and these seem to be somewhat variable, averaging 134 With her A1c still a little over 7% again appears that she has not improved her compliance with mealtime insulin She thinks she is taking her insulin better at lunchtime but will not be able to take in the evenings much because of eating out frequently and the inconvenience of doing the injection She has only one reading in the afternoon and one at night Also not able to lose any weight despite starting regular exercise Her insurance is requiring her to change to Humalog  Insulin regimen: Lantus 34 at bedtime, Apidra usually 18 units before meals         Side effects from medications:  diarrhea from metformin,? Hair loss from Actos Proper timing of medications in relation to meals: May miss dinner dose        Monitors blood glucose: Once a day.    Glucometer: One Touch.          Blood Glucose readings from meter download:   PREMEAL Breakfast Lunch  3 PM  Bedtime Overall  Glucose range:  98-141    119   168    Mean/median:        Hypoglycemia: None recently         Meals: 3 meals  per day. Dinner 6-8 pm        Physical activity: exercise: walking now 4/7, 30-45 min; tennis also             Diabetes education visit: Most recent: [id number]           Complications: are: None     Wt Readings from Last 3 Encounters:  01/03/14 200 lb 6.4 oz (90.901 kg)  10/05/13 200 lb 8 oz (90.946 kg)  08/08/13 196 lb 1.9 oz (88.959 kg)    Lab Results  Component Value Date   HGBA1C 7.2* 01/01/2014   HGBA1C 7.1* 10/02/2013   HGBA1C 7.3* 07/27/2013   Lab Results  Component Value Date   MICROALBUR 6.2* 10/02/2013   LDLCALC 49 10/03/2013   CREATININE 1.0 01/01/2014    Appointment on 01/01/2014  Component Date Value Ref Range Status  . Free T4 01/01/2014 0.80  0.60 - 1.60 ng/dL Final  . TSH 01/01/2014 2.72  0.35 - 5.50 uIU/mL Final  . Hemoglobin A1C 01/01/2014 7.2* 4.6 - 6.5 % Final   Glycemic Control Guidelines for People with Diabetes:Non Diabetic:  <6%Goal of Therapy: <7%Additional Action Suggested:  >8%   . Sodium 01/01/2014 142  135 - 145 mEq/L Final  . Potassium 01/01/2014 4.4  3.5 - 5.1 mEq/L Final  . Chloride 01/01/2014 109  96 -  112 mEq/L Final  . CO2 01/01/2014 25  19 - 32 mEq/L Final  . Glucose, Bld 01/01/2014 125* 70 - 99 mg/dL Final  . BUN 01/01/2014 16  6 - 23 mg/dL Final  . Creatinine, Ser 01/01/2014 1.0  0.4 - 1.2 mg/dL Final  . Total Bilirubin 01/01/2014 0.5  0.3 - 1.2 mg/dL Final  . Alkaline Phosphatase 01/01/2014 95  39 - 117 U/L Final  . AST 01/01/2014 18  0 - 37 U/L Final  . ALT 01/01/2014 22  0 - 35 U/L Final  . Total Protein 01/01/2014 7.0  6.0 - 8.3 g/dL Final  . Albumin 01/01/2014 3.5  3.5 - 5.2 g/dL Final  . Calcium 01/01/2014 9.6  8.4 - 10.5 mg/dL Final  . GFR 01/01/2014 61.96  >60.00 mL/min Final      Medication List       This list is accurate as of: 01/03/14  1:30 PM.  Always use your most recent med list.               aspirin 81 MG tablet  Take 81 mg by mouth QID.     candesartan-hydrochlorothiazide 16-12.5 MG per tablet  Commonly  known as:  ATACAND HCT     ezetimibe-simvastatin 10-40 MG per tablet  Commonly known as:  VYTORIN  Take 1 tablet by mouth daily.     glucose blood test strip  Commonly known as:  ONE TOUCH ULTRA TEST  Use as instructed to check blood sugars 3 times per day dx code 250.02     insulin glargine 100 UNIT/ML injection  Commonly known as:  LANTUS  Inject 34 Units into the skin at bedtime.     Insulin Glulisine 100 UNIT/ML Solostar Pen  Commonly known as:  APIDRA  18 Units 3 (three) times daily.     insulin lispro 100 UNIT/ML KiwkPen  Commonly known as:  HUMALOG KWIKPEN  Inject 6 units before breakfast and lunch and 8 units before supper     levothyroxine 125 MCG tablet  Commonly known as:  SYNTHROID, LEVOTHROID  Take 1/2 tablet daily     Liraglutide 18 MG/3ML Sopn  Commonly known as:  VICTOZA  Inject 1.2 mg into the skin daily.     losartan 100 MG tablet  Commonly known as:  COZAAR  Take 1 tablet (100 mg total) by mouth daily.     omega-3 acid ethyl esters 1 G capsule  Commonly known as:  LOVAZA  Take 2 g by mouth 2 (two) times daily.        Allergies:  Allergies  Allergen Reactions  . Actos [Pioglitazone]     hairloss  . Metformin And Related Diarrhea  . Novocain [Procaine]     syncopy  . Sulfa Antibiotics Itching    Past Medical History  Diagnosis Date  . Diabetes mellitus without complication   . Dyslipidemia   . Vertigo   . Complication of anesthesia     takes along time to wake up  . Arthritis   . Contact lens/glasses fitting     wears glasses or contacts  . Snores   . Hypothyroidism   . Coronary artery disease 01/2007    s/p PCI of diagonal   . Hyperlipidemia   . Hypertension     Past Surgical History  Procedure Laterality Date  . Cardiac catheterization  2008    stent  . Shoulder arthroscopy with open rotator cuff repair Right 02/28/2013    Procedure: right shoulder arthroscopy, distal  clavicle sculpting, open rotator cuff repair;  Surgeon:  Cammie Sickle., MD;  Location: [facility];  Service: Orthopedics;  Laterality: Right;    No family history on file.  Social History:  reports that she has never smoked. She does not have any smokeless tobacco history on file. She reports that she drinks alcohol. She reports that she does not use illicit drugs.  Review of Systems:  HYPERTENSION:  well controlled with Atacand  HYPERLIPIDEMIA: The lipid abnormality consists of elevated LDL, now taking Vytorin instead of simvastatin, followed by cardiologist.  Lab Results  Component Value Date   CHOL 113 10/03/2013   HDL 37.40* 10/03/2013   LDLCALC 49 10/03/2013   LDLDIRECT 121.2 05/08/2013   TRIG 132.0 10/03/2013   CHOLHDL 3 10/03/2013    History of mild hypothyroidism, previously treated by PCP  Does not complain of any unusual fatigue but since her TSH was upper normal she is now taking 62.5 mcg. Does not feel any different but her TSH is quite normal   Lab Results  Component Value Date   TSH 2.72 01/01/2014   TSH 4.37 10/02/2013   TSH 3.67 07/27/2013    No worsening of tingling/burning in her feet that she has occasionally including at night     Examination:   BP 124/72  Pulse 72  Temp(Src) 98.1 F (36.7 C)  Resp 16  Ht 5' 6.5" (1.689 m)  Wt 200 lb 6.4 oz (90.901 kg)  BMI 31.86 kg/m2  SpO2 94%  Body mass index is 31.86 kg/(m^2).   ASSESSMENT/ PLAN:   Diabetes type 2 with obesity   The patient's diabetes control appears to  be still somewhat inadequate with A1c still over  7% compared to 6.4% previously Most of her hyperglycemia appears to be from not taking her mealtime doses consistently at supper Her compliance with diet and exercise is generally fairly good but she is still requiring relatively large amounts of mealtime coverage Somewhat higher readings in the mornings are related to postprandial hyperglycemia at night although she thinks the morning readings are improving with more regular  exercise She was given the options of various insulin pump devices or inhaled insulin She prefers the [place] for convenience to take at mealtimes when she is not at home Advised her that this is a relatively faster acting insulin and will need to be taken right before eating She was shown the device and instructed on how to do this in detail Her spirometry is adequate to start this and explained the need for repeating this in 6 months  Recommendations made today:  Take 16 units of Afrezza right before meals, this may need to be adjusted based on postprandial readings  If forgetting to take this before eating she will not take this later  Continue the same Lantus dose unless morning sugars are consistently out of the 90-130 range   She will continue Victoza which has helped her previously in addition to insulin   More readings after lunch and supper to help adjust her mealtime doses especially at suppertime  HYPERLIPIDEMIA: This is being managed by cardiologist, LDL much better with using Vytorin but still has a relatively low HDL. She is starting to exercise and hopefully will lose weight to help with insulin resistance and metabolic syndrome  HYPOTHYROIDISM: Her TSH is more normal with 62.5 mcg.  She  needs to followup with PCP for general care  Counseling time over 50% of today's 25 minute visit  Elayne Snare 01/03/2014, 1:30 PM

## 2014-01-03 NOTE — Patient Instructions (Signed)
Afreeza 16 units with meals when starting  Please check blood sugars at least half the time about 2 hours after any meal and as directed on waking up. Please bring blood sugar monitor to each visit

## 2014-01-03 NOTE — Progress Notes (Signed)
This patient was instructed on the use of the Afrezza insulin inhaler.  A sample was given with a starter brochure and directions for use.  Sample (NDC#0024-5873-00 EUM#[id number] P Exp.8/16)  She will call if questions.

## 2014-01-19 DIAGNOSIS — Z0279 Encounter for issue of other medical certificate: Secondary | ICD-10-CM

## 2014-01-19 DIAGNOSIS — J309 Allergic rhinitis, unspecified: Secondary | ICD-10-CM | POA: Diagnosis not present

## 2014-01-26 ENCOUNTER — Telehealth: Payer: Self-pay | Admitting: Endocrinology

## 2014-01-26 NOTE — Telephone Encounter (Signed)
See below, Thanks!  

## 2014-01-26 NOTE — Telephone Encounter (Signed)
[employer] called letting us know the afrezza has been approved

## 2014-02-12 ENCOUNTER — Ambulatory Visit (INDEPENDENT_AMBULATORY_CARE_PROVIDER_SITE_OTHER): Payer: Medicare Other | Admitting: Endocrinology

## 2014-02-12 ENCOUNTER — Encounter: Payer: Self-pay | Admitting: Endocrinology

## 2014-02-12 VITALS — BP 138/82 | HR 80 | Temp 98.3°F | Resp 16 | Ht 66.5 in | Wt 198.4 lb

## 2014-02-12 DIAGNOSIS — I10 Essential (primary) hypertension: Secondary | ICD-10-CM

## 2014-02-12 DIAGNOSIS — IMO0001 Reserved for inherently not codable concepts without codable children: Secondary | ICD-10-CM

## 2014-02-12 DIAGNOSIS — E1165 Type 2 diabetes mellitus with hyperglycemia: Principal | ICD-10-CM

## 2014-02-12 DIAGNOSIS — E039 Hypothyroidism, unspecified: Secondary | ICD-10-CM | POA: Diagnosis not present

## 2014-02-12 NOTE — Progress Notes (Signed)
Patient ID: Crystal Hobbs, female   DOB: 07-17-1951, 63 y.o.   MRN: [id number]   Reason for Appointment: Diabetes follow-up   History of Present Illness   Diagnosis: Type 2 DIABETES MELITUS, date of diagnosis:  2011   Past history: Her initial presentation with diabetes was with significant symptoms and glucose of 947 Initially was treated with insulin and subsequently on metformin which she could not tolerate  Since she was not responding to Victoza and had significant hyperglycemia she was started on basal bolus insulin regimen instead  However had required relatively large doses of insulin with inadequate control and blood sugars subsequently improved significantly with adding Victoza in 10/13  Her  A1c was 6.4 in 4/14 and she had overall good control with minimal hypoglycemia  RECENT history:  She was started on Afrezza about a month ago and she has used this with success. Although she is using this mostly at lunch when she is eating out she has not been checking her blood sugars after lunch to verify the dose; currently taking 16 units, previously taking 18 units of Humalog or Apidra at lunch Although she has done some readings at bedtime also she seems to have relatively high readings about 2-4 hours p.c. supper; maybe occasionally higher with eating a dessert; sometimes will increase the dose by 2 units when she knows she will need a dessert  She has had fairly good compliance with her insulin at mealtimes now She finds that if she takes the [place] and does not eat right away she will feel hypoglycemic Fasting glucose: Appears to be relatively higher   Insulin regimen: Lantus 34 at bedtime, Apidra usually 18 units taking mostly breakfast and supper  Afrezza 16 units at lunch      Side effects from medications:  diarrhea from metformin,? Hair loss from Actos Proper timing of medications in relation to meals: May miss dinner dose        Monitors blood glucose: Once a day.     Glucometer: One Touch.          Blood Glucose readings from meter download:   PREMEAL Breakfast Lunch Dinner Bedtime Overall  Glucose range:  125-160   155   103, 146   130-191    Mean/median:  147    153  150    Hypoglycemia: Minimal, may feel hypoglycemic if taking Afrezza and not eating right away        Meals: 3 meals per day. Dinner 6-8 pm        Physical activity: exercise: walking 4/7, 30-45 min; tennis also, mostly after lunch             Diabetes education visit: Most recent: 04/3150           Complications: are: None     Wt Readings from Last 3 Encounters:  02/12/14 198 lb 6.4 oz (89.994 kg)  01/03/14 200 lb 6.4 oz (90.901 kg)  10/05/13 200 lb 8 oz (90.946 kg)    Lab Results  Component Value Date   HGBA1C 7.2* 01/01/2014   HGBA1C 7.1* 10/02/2013   HGBA1C 7.3* 07/27/2013   Lab Results  Component Value Date   MICROALBUR 6.2* 10/02/2013   LDLCALC 49 10/03/2013   CREATININE 1.0 01/01/2014    No visits with results within 1 Week(s) from this visit. Latest known visit with results is:  Appointment on 01/01/2014  Component Date Value Ref Range Status  . Free T4 01/01/2014 0.80  0.60 - 1.60  ng/dL Final  . TSH 01/01/2014 2.72  0.35 - 5.50 uIU/mL Final  . Hemoglobin A1C 01/01/2014 7.2* 4.6 - 6.5 % Final   Glycemic Control Guidelines for People with Diabetes:Non Diabetic:  <6%Goal of Therapy: <7%Additional Action Suggested:  >8%   . Sodium 01/01/2014 142  135 - 145 mEq/L Final  . Potassium 01/01/2014 4.4  3.5 - 5.1 mEq/L Final  . Chloride 01/01/2014 109  96 - 112 mEq/L Final  . CO2 01/01/2014 25  19 - 32 mEq/L Final  . Glucose, Bld 01/01/2014 125* 70 - 99 mg/dL Final  . BUN 01/01/2014 16  6 - 23 mg/dL Final  . Creatinine, Ser 01/01/2014 1.0  0.4 - 1.2 mg/dL Final  . Total Bilirubin 01/01/2014 0.5  0.3 - 1.2 mg/dL Final  . Alkaline Phosphatase 01/01/2014 95  39 - 117 U/L Final  . AST 01/01/2014 18  0 - 37 U/L Final  . ALT 01/01/2014 22  0 - 35 U/L Final  . Total Protein  01/01/2014 7.0  6.0 - 8.3 g/dL Final  . Albumin 01/01/2014 3.5  3.5 - 5.2 g/dL Final  . Calcium 01/01/2014 9.6  8.4 - 10.5 mg/dL Final  . GFR 01/01/2014 61.96  >60.00 mL/min Final      Medication List       This list is accurate as of: 02/12/14  1:40 PM.  Always use your most recent med list.               aspirin 81 MG tablet  Take 81 mg by mouth QID.     candesartan-hydrochlorothiazide 16-12.5 MG per tablet  Commonly known as:  ATACAND HCT     ezetimibe-simvastatin 10-40 MG per tablet  Commonly known as:  VYTORIN  Take 1 tablet by mouth daily.     glucose blood test strip  Commonly known as:  ONE TOUCH ULTRA TEST  Use as instructed to check blood sugars 3 times per day dx code 250.02     insulin glargine 100 UNIT/ML injection  Commonly known as:  LANTUS  Inject 34 Units into the skin at bedtime.     Insulin Glulisine 100 UNIT/ML Solostar Pen  Commonly known as:  APIDRA  18 Units 3 (three) times daily.     insulin lispro 100 UNIT/ML KiwkPen  Commonly known as:  HUMALOG KWIKPEN  Inject 6 units before breakfast and lunch and 8 units before supper     Insulin Regular Human 4 & 8 UNITS Powd  Commonly known as:  AFREZZA  Inhale 16 Units into the lungs 3 (three) times daily before meals.     levothyroxine 125 MCG tablet  Commonly known as:  SYNTHROID, LEVOTHROID  Take 1/2 tablet daily     Liraglutide 18 MG/3ML Sopn  Commonly known as:  VICTOZA  Inject 1.2 mg into the skin daily.     losartan 100 MG tablet  Commonly known as:  COZAAR  Take 1 tablet (100 mg total) by mouth daily.     omega-3 acid ethyl esters 1 G capsule  Commonly known as:  LOVAZA  Take 2 g by mouth 2 (two) times daily.        Allergies:  Allergies  Allergen Reactions  . Actos [Pioglitazone]     hairloss  . Metformin And Related Diarrhea  . Novocain [Procaine]     syncopy  . Sulfa Antibiotics Itching    Past Medical History  Diagnosis Date  . Diabetes mellitus without complication    . Dyslipidemia   .  Vertigo   . Complication of anesthesia     takes along time to wake up  . Arthritis   . Contact lens/glasses fitting     wears glasses or contacts  . Snores   . Hypothyroidism   . Coronary artery disease 01/2007    s/p PCI of diagonal   . Hyperlipidemia   . Hypertension     Past Surgical History  Procedure Laterality Date  . Cardiac catheterization  2008    stent  . Shoulder arthroscopy with open rotator cuff repair Right 02/28/2013    Procedure: right shoulder arthroscopy, distal clavicle sculpting, open rotator cuff repair;  Surgeon: Cammie Sickle., MD;  Location: [facility];  Service: Orthopedics;  Laterality: Right;    No family history on file.  Social History:  reports that she has never smoked. She does not have any smokeless tobacco history on file. She reports that she drinks alcohol. She reports that she does not use illicit drugs.  Review of Systems:  HYPERTENSION:  well controlled with Atacand  HYPERLIPIDEMIA: The lipid abnormality consists of elevated LDL, now taking Vytorin instead of simvastatin, followed by cardiologist.  Lab Results  Component Value Date   CHOL 113 10/03/2013   HDL 37.40* 10/03/2013   LDLCALC 49 10/03/2013   LDLDIRECT 121.2 05/08/2013   TRIG 132.0 10/03/2013   CHOLHDL 3 10/03/2013    History of mild hypothyroidism, previously treated by PCP  Does not complain of any unusual fatigue, she is now taking 62.5 mcg. Does not feel any different with recent increase in dose  Lab Results  Component Value Date   TSH 2.72 01/01/2014   TSH 4.37 10/02/2013   TSH 3.67 07/27/2013    No worsening of tingling/burning in her feet that she has occasionally including at night     Examination:   BP 138/82  Pulse 80  Temp(Src) 98.3 F (36.8 C)  Resp 16  Ht 5' 6.5" (1.689 m)  Wt 198 lb 6.4 oz (89.994 kg)  BMI 31.55 kg/m2  SpO2 94%  Body mass index is 31.55 kg/(m^2).   ASSESSMENT/ PLAN:   Diabetes type 2  with obesity   The patient's diabetes control appears to  be somewhat better with starting Afrezza to help her compliance with mealtime insulin regimen. She has no difficulty using this and has no side effects However she is still somewhat irregular with glucose monitoring after breakfast and lunch and not checking readings 2 hours after evening meal Recent readings show a relatively higher fasting readings and also overall somewhat high bedtime readings  Recommendations made today:  Take the same dose of 16 units of Afrezza right before meals, this may need to be adjusted based on postprandial readings after lunch  Increase Lantus by 2 units  Increase suppertime Humalog by 2 units and another 2 units if eating desserts  May reduce suppertime Humalog by 2 units if planning to exercise after eating    Elayne Snare 02/12/2014, 1:40 PM

## 2014-02-12 NOTE — Patient Instructions (Addendum)
More sugars 2 hrs after meals  Use 20 Apidra at supper unless exercising, 36 Lantus

## 2014-03-19 ENCOUNTER — Other Ambulatory Visit: Payer: Self-pay | Admitting: *Deleted

## 2014-03-19 MED ORDER — INSULIN GLARGINE 100 UNIT/ML SOLOSTAR PEN: 34.0000 [IU] | PEN_INJECTOR | Freq: Every day | SUBCUTANEOUS | Status: DC

## 2014-04-02 ENCOUNTER — Other Ambulatory Visit: Payer: Self-pay | Admitting: *Deleted

## 2014-04-02 MED ORDER — INSULIN PEN NEEDLE 31G X 5 MM MISC: Status: DC

## 2014-04-04 ENCOUNTER — Other Ambulatory Visit (INDEPENDENT_AMBULATORY_CARE_PROVIDER_SITE_OTHER): Payer: Medicare Other

## 2014-04-04 ENCOUNTER — Other Ambulatory Visit: Payer: Self-pay | Admitting: General Surgery

## 2014-04-04 ENCOUNTER — Encounter: Payer: Self-pay | Admitting: General Surgery

## 2014-04-04 DIAGNOSIS — E785 Hyperlipidemia, unspecified: Secondary | ICD-10-CM

## 2014-04-04 LAB — LIPID PANEL
CHOLESTEROL: 110 mg/dL (ref 0–200)
HDL: 36 mg/dL — AB (ref >39.00)
LDL CALC: 48 mg/dL (ref 0–99)
NonHDL: 74
TRIGLYCERIDES: 128 mg/dL (ref 0.0–149.0)
Total CHOL/HDL Ratio: 3
VLDL: 25.6 mg/dL (ref 0.0–40.0)

## 2014-04-04 LAB — HEPATIC FUNCTION PANEL
ALBUMIN: 3.6 g/dL (ref 3.5–5.2)
ALT: 27 U/L (ref 0–35)
AST: 22 U/L (ref 0–37)
Alkaline Phosphatase: 90 U/L (ref 39–117)
Bilirubin, Direct: 0 mg/dL (ref 0.0–0.3)
TOTAL PROTEIN: 6.9 g/dL (ref 6.0–8.3)
Total Bilirubin: 0.5 mg/dL (ref 0.2–1.2)

## 2014-04-09 ENCOUNTER — Other Ambulatory Visit: Payer: Self-pay | Admitting: *Deleted

## 2014-04-09 MED ORDER — LEVOTHYROXINE SODIUM 125 MCG PO TABS: ORAL_TABLET | ORAL | Status: DC

## 2014-04-20 ENCOUNTER — Other Ambulatory Visit: Payer: Self-pay | Admitting: *Deleted

## 2014-04-20 MED ORDER — LIRAGLUTIDE 18 MG/3ML ~~LOC~~ SOPN: 1.2000 mg | PEN_INJECTOR | Freq: Every day | SUBCUTANEOUS | Status: DC

## 2014-05-01 ENCOUNTER — Other Ambulatory Visit: Payer: Self-pay | Admitting: *Deleted

## 2014-05-01 MED ORDER — INSULIN LISPRO 100 UNIT/ML (KWIKPEN): PEN_INJECTOR | SUBCUTANEOUS | Status: DC

## 2014-05-15 ENCOUNTER — Other Ambulatory Visit: Payer: Medicare Other

## 2014-05-15 ENCOUNTER — Other Ambulatory Visit (INDEPENDENT_AMBULATORY_CARE_PROVIDER_SITE_OTHER): Payer: Medicare Other

## 2014-05-15 DIAGNOSIS — E1165 Type 2 diabetes mellitus with hyperglycemia: Principal | ICD-10-CM

## 2014-05-15 DIAGNOSIS — IMO0001 Reserved for inherently not codable concepts without codable children: Secondary | ICD-10-CM

## 2014-05-15 LAB — COMPREHENSIVE METABOLIC PANEL
ALBUMIN: 3.6 g/dL (ref 3.5–5.2)
ALK PHOS: 93 U/L (ref 39–117)
ALT: 28 U/L (ref 0–35)
AST: 25 U/L (ref 0–37)
BILIRUBIN TOTAL: 0.5 mg/dL (ref 0.2–1.2)
BUN: 18 mg/dL (ref 6–23)
CO2: 27 mEq/L (ref 19–32)
Calcium: 9.8 mg/dL (ref 8.4–10.5)
Chloride: 109 mEq/L (ref 96–112)
Creatinine, Ser: 1.1 mg/dL (ref 0.4–1.2)
GFR: 55.2 mL/min — ABNORMAL LOW (ref >60.00)
Glucose, Bld: 129 mg/dL — ABNORMAL HIGH (ref 70–99)
POTASSIUM: 4.4 meq/L (ref 3.5–5.1)
SODIUM: 142 meq/L (ref 135–145)
TOTAL PROTEIN: 7.3 g/dL (ref 6.0–8.3)

## 2014-05-15 LAB — TSH: TSH: 5.76 u[IU]/mL — AB (ref 0.35–4.50)

## 2014-05-15 LAB — T4, FREE: FREE T4: 0.83 ng/dL (ref 0.60–1.60)

## 2014-05-15 LAB — MICROALBUMIN / CREATININE URINE RATIO
Creatinine,U: 110.7 mg/dL
Microalb Creat Ratio: 27.5 mg/g (ref 0.0–30.0)
Microalb, Ur: 30.4 mg/dL — ABNORMAL HIGH (ref 0.0–1.9)

## 2014-05-15 LAB — HEMOGLOBIN A1C: Hgb A1c MFr Bld: 7.7 % — ABNORMAL HIGH (ref 4.6–6.5)

## 2014-05-18 ENCOUNTER — Other Ambulatory Visit: Payer: Self-pay | Admitting: *Deleted

## 2014-05-18 ENCOUNTER — Encounter: Payer: Self-pay | Admitting: Endocrinology

## 2014-05-18 ENCOUNTER — Ambulatory Visit (INDEPENDENT_AMBULATORY_CARE_PROVIDER_SITE_OTHER): Payer: Medicare Other | Admitting: Endocrinology

## 2014-05-18 VITALS — BP 122/80 | HR 70 | Temp 97.9°F | Resp 14 | Ht 66.5 in | Wt 196.4 lb

## 2014-05-18 DIAGNOSIS — IMO0001 Reserved for inherently not codable concepts without codable children: Secondary | ICD-10-CM

## 2014-05-18 DIAGNOSIS — E039 Hypothyroidism, unspecified: Secondary | ICD-10-CM

## 2014-05-18 DIAGNOSIS — E785 Hyperlipidemia, unspecified: Secondary | ICD-10-CM

## 2014-05-18 DIAGNOSIS — Z23 Encounter for immunization: Secondary | ICD-10-CM

## 2014-05-18 DIAGNOSIS — E1165 Type 2 diabetes mellitus with hyperglycemia: Principal | ICD-10-CM

## 2014-05-18 MED ORDER — LEVOTHYROXINE SODIUM 75 MCG PO TABS: 75.0000 ug | ORAL_TABLET | Freq: Every day | ORAL | Status: DC

## 2014-05-18 NOTE — Patient Instructions (Signed)
May need 22 Humalog at supper, target <180 at 2 hours  New Rx for thyroid

## 2014-05-18 NOTE — Progress Notes (Signed)
Patient ID: Crystal Hobbs, female   DOB: April 16, 1951, 63 y.o.   MRN: [id number]   Reason for Appointment: Diabetes follow-up   History of Present Illness   Diagnosis: Type 2 DIABETES MELITUS, date of diagnosis:  2011   Past history: Her initial presentation with diabetes was with significant symptoms and glucose of 947 Initially was treated with insulin and subsequently on metformin which she could not tolerate  Since she was not responding to Victoza and had significant hyperglycemia she was started on basal bolus insulin regimen instead  However had required relatively large doses of insulin with inadequate control and blood sugars subsequently improved significantly with adding Victoza in 10/13  Her  A1c was 6.4 in 4/14 and she had overall good control with minimal hypoglycemia  RECENT history:  She was started on Afrezza and was satisfied with using this but could not continue because of cost. She has switched from Apidra to Humalog because of insurance coverage Again has difficulty with compliance with her lunchtime dose Also not getting enough coverage for her evening meal frequently even with using 20 units A1c has gone up but not clear why since her fasting readings are about the same Also she does not think she has had the time to exercise as much She is compliant with her Victoza   Insulin regimen: Lantus 36 at bedtime, Humalog usually 20 units taking mostly breakfast and supper     Side effects from medications:  diarrhea from metformin,? Hair loss from Actos Proper timing of medications in relation to meals: May miss lunch dose at times        Monitors blood glucose: Once a day.    Glucometer: One Touch.          Blood Glucose readings from meter download:   PREMEAL Breakfast Lunch Dinner  9-11 PM  Overall  Glucose range:  117-165   86-172  ?   125-226    Mean/median:  138   111    190  140   Hypoglycemia: None recently      Meals: 3 meals per day. Dinner at 6-8 pm         Physical activity: exercise: walking 2/7, 30-45 min; playing tennis sometimes             Diabetes education visit: Most recent: 01/8849           Complications: are: None     Wt Readings from Last 3 Encounters:  05/18/14 196 lb 6.4 oz (89.086 kg)  02/12/14 198 lb 6.4 oz (89.994 kg)  01/03/14 200 lb 6.4 oz (90.901 kg)    Lab Results  Component Value Date   HGBA1C 7.7* 05/15/2014   HGBA1C 7.2* 01/01/2014   HGBA1C 7.1* 10/02/2013   Lab Results  Component Value Date   MICROALBUR 30.4* 05/15/2014   LDLCALC 48 04/04/2014   CREATININE 1.1 05/15/2014    Appointment on 05/15/2014  Component Date Value Ref Range Status  . Hemoglobin A1C 05/15/2014 7.7* 4.6 - 6.5 % Final   Glycemic Control Guidelines for People with Diabetes:Non Diabetic:  <6%Goal of Therapy: <7%Additional Action Suggested:  >8%   . Sodium 05/15/2014 142  135 - 145 mEq/L Final  . Potassium 05/15/2014 4.4  3.5 - 5.1 mEq/L Final  . Chloride 05/15/2014 109  96 - 112 mEq/L Final  . CO2 05/15/2014 27  19 - 32 mEq/L Final  . Glucose, Bld 05/15/2014 129* 70 - 99 mg/dL Final  . BUN 05/15/2014 18  6 - 23 mg/dL Final  . Creatinine, Ser 05/15/2014 1.1  0.4 - 1.2 mg/dL Final  . Total Bilirubin 05/15/2014 0.5  0.2 - 1.2 mg/dL Final  . Alkaline Phosphatase 05/15/2014 93  39 - 117 U/L Final  . AST 05/15/2014 25  0 - 37 U/L Final  . ALT 05/15/2014 28  0 - 35 U/L Final  . Total Protein 05/15/2014 7.3  6.0 - 8.3 g/dL Final  . Albumin 05/15/2014 3.6  3.5 - 5.2 g/dL Final  . Calcium 05/15/2014 9.8  8.4 - 10.5 mg/dL Final  . GFR 05/15/2014 55.20* >60.00 mL/min Final  . Microalb, Ur 05/15/2014 30.4* 0.0 - 1.9 mg/dL Final  . Creatinine,U 05/15/2014 110.7   Final  . Microalb Creat Ratio 05/15/2014 27.5  0.0 - 30.0 mg/g Final  . TSH 05/15/2014 5.76* 0.35 - 4.50 uIU/mL Final  . Free T4 05/15/2014 0.83  0.60 - 1.60 ng/dL Final      Medication List       This list is accurate as of: 05/18/14  8:40 AM.  Always use your most recent med list.                aspirin 81 MG tablet  Take 81 mg by mouth QID.     candesartan 16 MG tablet  Commonly known as:  ATACAND  Take 16 mg by mouth daily.     ezetimibe-simvastatin 10-40 MG per tablet  Commonly known as:  VYTORIN  Take 1 tablet by mouth daily.     glucose blood test strip  Commonly known as:  ONE TOUCH ULTRA TEST  Use as instructed to check blood sugars 3 times per day dx code 250.02     Insulin Glargine 100 UNIT/ML Solostar Pen  Commonly known as:  LANTUS  Inject 36 Units into the skin daily.     insulin lispro 100 UNIT/ML KiwkPen  Commonly known as:  HUMALOG  Inject 20 Units into the skin 3 (three) times daily.     Insulin Pen Needle 31G X 5 MM Misc  Use as directed 5 times per day     Insulin Regular Human 4 & 8 UNITS Powd  Commonly known as:  AFREZZA  Inhale 16 Units into the lungs 3 (three) times daily before meals.     levothyroxine 125 MCG tablet  Commonly known as:  SYNTHROID, LEVOTHROID  Take 1/2 tablet daily     Liraglutide 18 MG/3ML Sopn  Commonly known as:  VICTOZA  Inject 1.2 mg into the skin daily.     losartan 100 MG tablet  Commonly known as:  COZAAR  Take 1 tablet (100 mg total) by mouth daily.     omega-3 acid ethyl esters 1 G capsule  Commonly known as:  LOVAZA  Take 2 g by mouth 2 (two) times daily.        Allergies:  Allergies  Allergen Reactions  . Actos [Pioglitazone]     hairloss  . Metformin And Related Diarrhea  . Novocain [Procaine]     syncopy  . Sulfa Antibiotics Itching    Past Medical History  Diagnosis Date  . Diabetes mellitus without complication   . Dyslipidemia   . Vertigo   . Complication of anesthesia     takes along time to wake up  . Arthritis   . Contact lens/glasses fitting     wears glasses or contacts  . Snores   . Hypothyroidism   . Coronary artery disease 01/2007  s/p PCI of diagonal   . Hyperlipidemia   . Hypertension     Past Surgical History  Procedure Laterality Date  .  Cardiac catheterization  2008    stent  . Shoulder arthroscopy with open rotator cuff repair Right 02/28/2013    Procedure: right shoulder arthroscopy, distal clavicle sculpting, open rotator cuff repair;  Surgeon: Cammie Sickle., MD;  Location: [facility];  Service: Orthopedics;  Laterality: Right;    No family history on file.  Social History:  reports that she has never smoked. She does not have any smokeless tobacco history on file. She reports that she drinks alcohol. She reports that she does not use illicit drugs.  Review of Systems:  HYPERTENSION:  well controlled with Atacand  HYPERLIPIDEMIA: The lipid abnormality consists of elevated LDL, now taking Vytorin instead of simvastatin, followed by cardiologist.  Lab Results  Component Value Date   CHOL 110 04/04/2014   HDL 36.00* 04/04/2014   LDLCALC 48 04/04/2014   LDLDIRECT 121.2 05/08/2013   TRIG 128.0 04/04/2014   CHOLHDL 3 04/04/2014    History of mild hypothyroidism, previously treated by PCP  Does not complain of any unusual fatigue, she is now taking 62.5 mcg.  However she has been busier than usual and not clear if she is tired from this. Her compliance is good with medication TSH is relatively higher   Lab Results  Component Value Date   TSH 5.76* 05/15/2014   TSH 2.72 01/01/2014   TSH 4.37 10/02/2013   Has some symptoms of tingling/burning in her feet that are tolerable     Examination:   BP 122/80  Pulse 70  Temp(Src) 97.9 F (36.6 C)  Resp 14  Ht 5' 6.5" (1.689 m)  Wt 196 lb 6.4 oz (89.086 kg)  BMI 31.23 kg/m2  SpO2 96%  Body mass index is 31.23 kg/(m^2).   ASSESSMENT/ PLAN:   Diabetes type 2 with obesity   The patient's diabetes control appears to  be worse with going back on multiple injections instead of inhaled insulin at mealtimes Her fasting blood sugars are mildly increased but not and before Most likely her A1c increase is likely to be related to postprandial  hyperglycemia This may be partly related to her decreased exercise level and also to not taking lunchtime coverage consistently She is still concerned about the cost of her injections because of being in the Medicare group  Discussed today the options for using generic regular insulin but she does not want to use a syringe Also showed her how to use the V.-go pump and how this may be used. Although this should provide fairly good control it may not provide enough mealtime coverage She is however willing to give this a try and also will check on the out-of-pocket expense Meanwhile will increase her suppertime dose by at least 2 units especially when she is not eating a small meal She will try to increase her exercise as able to  HYPOTHYROIDISM: Her TSH is trending higher again despite good compliance, will change her to 75 mcg  Counseling time over 50% of today's 25 minute visit  Influenza vaccine given   Spence Soberano 05/18/2014, 8:40 AM

## 2014-06-04 ENCOUNTER — Other Ambulatory Visit: Payer: Self-pay | Admitting: Endocrinology

## 2014-06-04 ENCOUNTER — Encounter: Payer: Self-pay | Admitting: Nutrition

## 2014-06-04 ENCOUNTER — Encounter: Payer: Medicare Other | Attending: Endocrinology | Admitting: Nutrition

## 2014-06-04 VITALS — Wt 199.6 lb

## 2014-06-04 DIAGNOSIS — IMO0002 Reserved for concepts with insufficient information to code with codable children: Secondary | ICD-10-CM

## 2014-06-04 DIAGNOSIS — IMO0001 Reserved for inherently not codable concepts without codable children: Secondary | ICD-10-CM

## 2014-06-04 DIAGNOSIS — Z794 Long term (current) use of insulin: Secondary | ICD-10-CM | POA: Diagnosis not present

## 2014-06-04 DIAGNOSIS — Z713 Dietary counseling and surveillance: Secondary | ICD-10-CM | POA: Diagnosis not present

## 2014-06-04 DIAGNOSIS — E1165 Type 2 diabetes mellitus with hyperglycemia: Secondary | ICD-10-CM

## 2014-06-04 DIAGNOSIS — E1169 Type 2 diabetes mellitus with other specified complication: Secondary | ICD-10-CM

## 2014-06-04 NOTE — Assessment & Plan Note (Signed)
Pt. Is not taking her insulin before most meals. She did not take her Humalog before breakfast today,  Because she did not "have time".  She ususally takes her insulin after lunch--sometimes 2 hours after the meal, and last night, she did not take it before supper, because her premeal reading was 99.   Discussed the importance of taking the insulin before the meal--requires less insulin, the 2hr. pc reading is much lower, and less insulin means less weight gain.  She was all for this and assured me that she would take in before the meals. Exercise:  Is not doing any now.  Loves to play tennis, but has gotten away from it.  Suggested she start back today, and she agreed to do this.  She will walk on the treadmil the other 2 days, for 20-30 min. q AM.   Diet:  Meals are all balanced, and appears she is eating 1800 with a large HS snack of "something sweet".   Discusses some other alternative HS snacks that have less than 150 calories.

## 2014-06-04 NOTE — Patient Instructions (Signed)
Take Humalog before all meals Exercise 4-5 days/wk for 30 min.

## 2014-06-06 ENCOUNTER — Encounter: Payer: Medicare Other | Admitting: Nutrition

## 2014-06-06 DIAGNOSIS — E1165 Type 2 diabetes mellitus with hyperglycemia: Principal | ICD-10-CM

## 2014-06-06 DIAGNOSIS — IMO0001 Reserved for inherently not codable concepts without codable children: Secondary | ICD-10-CM

## 2014-06-06 NOTE — Progress Notes (Signed)
Crystal Hobbs was started on the V-go 30.  She was instructed on how to fill apply and use the V-go.  She was told to test her  Blood sugars ac and HS.  She was given a starter kit and vial of Novolog. She was told to call [employer] customer care to verify insurance and if she needs some assitance with this.  She agreed to do this. She will start by giving 6 button presses before each meal, and if blood sugars are high before the next meal, she will give 7-8 button presses and suppliment the Novolog ac meals with a Humalog pen.  She is aware that there is only 36u in the V-go to give a meal time.   She will attach the V-go tonight at 8PM. ( She took her [place] at 10PM last night).   She had no final questions.

## 2014-06-06 NOTE — Patient Instructions (Signed)
Read over instructions in the starter kit.   Call [facility] customer care if questions. Call to make an appt. For Oct. 5th. To see Dr. Dwyane Dee when you return from [facility]

## 2014-06-18 ENCOUNTER — Other Ambulatory Visit: Payer: Self-pay | Admitting: *Deleted

## 2014-06-18 ENCOUNTER — Ambulatory Visit: Payer: Medicare Other | Admitting: Endocrinology

## 2014-06-18 ENCOUNTER — Telehealth: Payer: Self-pay | Admitting: Endocrinology

## 2014-06-18 MED ORDER — INSULIN LISPRO 100 UNIT/ML (KWIKPEN)
18.0000 [IU] | PEN_INJECTOR | Freq: Three times a day (TID) | SUBCUTANEOUS | Status: DC
Start: 2014-06-18 — End: 2014-06-22

## 2014-06-18 NOTE — Telephone Encounter (Signed)
Please see below and advise if okay to send that?

## 2014-06-18 NOTE — Telephone Encounter (Signed)
ok 

## 2014-06-18 NOTE — Telephone Encounter (Signed)
Pt is having humalog qwikpen rx issues the rx needs to have the new dosage 18 u 3 times daily

## 2014-06-19 ENCOUNTER — Other Ambulatory Visit: Payer: Medicare Other

## 2014-06-19 ENCOUNTER — Other Ambulatory Visit: Payer: Self-pay | Admitting: *Deleted

## 2014-06-22 ENCOUNTER — Encounter: Payer: Self-pay | Admitting: Endocrinology

## 2014-06-22 ENCOUNTER — Other Ambulatory Visit: Payer: Self-pay | Admitting: *Deleted

## 2014-06-22 ENCOUNTER — Ambulatory Visit (INDEPENDENT_AMBULATORY_CARE_PROVIDER_SITE_OTHER): Payer: Medicare Other | Admitting: Endocrinology

## 2014-06-22 VITALS — BP 128/72 | HR 85 | Temp 98.3°F | Resp 16 | Ht 66.5 in | Wt 195.4 lb

## 2014-06-22 DIAGNOSIS — E038 Other specified hypothyroidism: Secondary | ICD-10-CM | POA: Diagnosis not present

## 2014-06-22 DIAGNOSIS — E1165 Type 2 diabetes mellitus with hyperglycemia: Secondary | ICD-10-CM | POA: Diagnosis not present

## 2014-06-22 DIAGNOSIS — E063 Autoimmune thyroiditis: Secondary | ICD-10-CM | POA: Insufficient documentation

## 2014-06-22 DIAGNOSIS — IMO0002 Reserved for concepts with insufficient information to code with codable children: Secondary | ICD-10-CM

## 2014-06-22 MED ORDER — INSULIN LISPRO 100 UNIT/ML ~~LOC~~ SOLN: SUBCUTANEOUS | Status: DC

## 2014-06-22 MED ORDER — V-GO 30 KIT: PACK | Status: DC

## 2014-06-22 NOTE — Progress Notes (Signed)
Patient ID: ZAYLEI MULLANE, female   DOB: 06/13/1951, 63 y.o.   MRN: [id number]   Reason for Appointment: Diabetes follow-up   History of Present Illness   Diagnosis: Type 2 DIABETES MELITUS, date of diagnosis:  2011   Past history: Her initial presentation with diabetes was with significant symptoms and glucose of 947 Initially was treated with insulin and subsequently on metformin which she could not tolerate  Since she was not responding to Victoza and had significant hyperglycemia she was started on basal bolus insulin regimen instead  However had required relatively large doses of insulin with inadequate control and blood sugars subsequently improved significantly with adding Victoza in 10/13  Her  A1c was 6.4 in 4/14 and she had overall good control with minimal hypoglycemia  RECENT history:  She was taken off  Afrezza because of her high out-of-pocket expense Her A1c was rising to 7.7 because of difficulty with compliance with her mealtime insulin especially lunch and also some fluctuation in overnight readings She is now on a trial of the V.-go pump with the 30 units of basal delivery Subjectively she is liking this and his more compliant with her mealtime boluses She was told to add extra 4 units for her suppertime bolus but she is not doing this Blood sugars are relatively higher at bedtime and also somewhat in the morning  She did not bring her monitor for download and not clear if her blood sugars are well controlled during the day; usually not checking before supper or in the afternoon Also she has been trying to exercise more She is compliant with her Victoza   Insulin regimen: Boluses 12 units before meals, V.-go 30 units basal  Side effects from medications:  diarrhea from metformin,? Hair loss from Actos Proper timing of medications in relation to meals: Yes        Monitors blood glucose: Once a day.    Glucometer: One Touch.          Blood Glucose readings from recall:    PREMEAL Breakfast Lunch Dinner Bedtime Overall  Glucose range: 120-170 90-100 125 160   Mean/median:     ?     Hypoglycemia: None recently      Meals: 3 meals per day. Dinner at 6-8 pm        Physical activity: exercise: walking 2/7, 30-45 min; playing tennis sometimes             Diabetes education visit: Most recent: 01/1760           Complications: are: None     Wt Readings from Last 3 Encounters:  06/22/14 195 lb 6.4 oz (88.633 kg)  06/04/14 199 lb 9.6 oz (90.538 kg)  05/18/14 196 lb 6.4 oz (89.086 kg)    Lab Results  Component Value Date   HGBA1C 7.7* 05/15/2014   HGBA1C 7.2* 01/01/2014   HGBA1C 7.1* 10/02/2013   Lab Results  Component Value Date   MICROALBUR 30.4* 05/15/2014   LDLCALC 48 04/04/2014   CREATININE 1.1 05/15/2014    No visits with results within 1 Week(s) from this visit. Latest known visit with results is:  Appointment on 05/15/2014  Component Date Value Ref Range Status  . Hemoglobin A1C 05/15/2014 7.7* 4.6 - 6.5 % Final   Glycemic Control Guidelines for People with Diabetes:Non Diabetic:  <6%Goal of Therapy: <7%Additional Action Suggested:  >8%   . Sodium 05/15/2014 142  135 - 145 mEq/L Final  . Potassium 05/15/2014 4.4  3.5 -  5.1 mEq/L Final  . Chloride 05/15/2014 109  96 - 112 mEq/L Final  . CO2 05/15/2014 27  19 - 32 mEq/L Final  . Glucose, Bld 05/15/2014 129* 70 - 99 mg/dL Final  . BUN 05/15/2014 18  6 - 23 mg/dL Final  . Creatinine, Ser 05/15/2014 1.1  0.4 - 1.2 mg/dL Final  . Total Bilirubin 05/15/2014 0.5  0.2 - 1.2 mg/dL Final  . Alkaline Phosphatase 05/15/2014 93  39 - 117 U/L Final  . AST 05/15/2014 25  0 - 37 U/L Final  . ALT 05/15/2014 28  0 - 35 U/L Final  . Total Protein 05/15/2014 7.3  6.0 - 8.3 g/dL Final  . Albumin 05/15/2014 3.6  3.5 - 5.2 g/dL Final  . Calcium 05/15/2014 9.8  8.4 - 10.5 mg/dL Final  . GFR 05/15/2014 55.20* >60.00 mL/min Final  . Microalb, Ur 05/15/2014 30.4* 0.0 - 1.9 mg/dL Final  . Creatinine,U 05/15/2014 110.7    Final  . Microalb Creat Ratio 05/15/2014 27.5  0.0 - 30.0 mg/g Final  . TSH 05/15/2014 5.76* 0.35 - 4.50 uIU/mL Final  . Free T4 05/15/2014 0.83  0.60 - 1.60 ng/dL Final      Medication List       This list is accurate as of: 06/22/14  9:11 AM.  Always use your most recent med list.               aspirin 81 MG tablet  Take 81 mg by mouth QID.     candesartan 16 MG tablet  Commonly known as:  ATACAND  Take 16 mg by mouth daily.     ezetimibe-simvastatin 10-40 MG per tablet  Commonly known as:  VYTORIN  Take 1 tablet by mouth daily.     glucose blood test strip  Commonly known as:  ONE TOUCH ULTRA TEST  Use as instructed to check blood sugars 3 times per day dx code 250.02     Insulin Glargine 100 UNIT/ML Solostar Pen  Commonly known as:  LANTUS  Inject 36 Units into the skin daily.     insulin lispro 100 UNIT/ML KiwkPen  Commonly known as:  HUMALOG  Inject 0.18 mLs (18 Units total) into the skin 3 (three) times daily.     Insulin Pen Needle 31G X 5 MM Misc  Use as directed 5 times per day     Insulin Regular Human 4 & 8 UNITS Powd  Commonly known as:  AFREZZA  Inhale 16 Units into the lungs 3 (three) times daily before meals.     levothyroxine 75 MCG tablet  Commonly known as:  SYNTHROID, LEVOTHROID  Take 1 tablet (75 mcg total) by mouth daily.     Liraglutide 18 MG/3ML Sopn  Commonly known as:  VICTOZA  Inject 1.2 mg into the skin daily.     losartan 100 MG tablet  Commonly known as:  COZAAR  Take 1 tablet (100 mg total) by mouth daily.     omega-3 acid ethyl esters 1 G capsule  Commonly known as:  LOVAZA  Take 2 g by mouth 2 (two) times daily.        Allergies:  Allergies  Allergen Reactions  . Actos [Pioglitazone]     hairloss  . Metformin And Related Diarrhea  . Novocain [Procaine]     syncopy  . Sulfa Antibiotics Itching    Past Medical History  Diagnosis Date  . Diabetes mellitus without complication   . Dyslipidemia   . Vertigo    .  Complication of anesthesia     takes along time to wake up  . Arthritis   . Contact lens/glasses fitting     wears glasses or contacts  . Snores   . Hypothyroidism   . Coronary artery disease 01/2007    s/p PCI of diagonal   . Hyperlipidemia   . Hypertension     Past Surgical History  Procedure Laterality Date  . Cardiac catheterization  2008    stent  . Shoulder arthroscopy with open rotator cuff repair Right 02/28/2013    Procedure: right shoulder arthroscopy, distal clavicle sculpting, open rotator cuff repair;  Surgeon: Cammie Sickle., MD;  Location: [facility];  Service: Orthopedics;  Laterality: Right;    No family history on file.  Social History:  reports that she has never smoked. She does not have any smokeless tobacco history on file. She reports that she drinks alcohol. She reports that she does not use illicit drugs.  Review of Systems:  HYPERTENSION:  well controlled with Atacand  HYPERLIPIDEMIA: The lipid abnormality consists of elevated LDL, now taking Vytorin instead of simvastatin, followed by cardiologist.  Lab Results  Component Value Date   CHOL 110 04/04/2014   HDL 36.00* 04/04/2014   LDLCALC 48 04/04/2014   LDLDIRECT 121.2 05/08/2013   TRIG 128.0 04/04/2014   CHOLHDL 3 04/04/2014    History of mild hypothyroidism, previously treated by PCP  Does not complain of any unusual fatigue, she is now taking 62.5 mcg.  However she has been busier than usual and not clear if she is tired from this. Her compliance is good with medication TSH is relatively higher   Lab Results  Component Value Date   TSH 5.76* 05/15/2014   TSH 2.72 01/01/2014   TSH 4.37 10/02/2013   Has some symptoms of tingling/burning in her feet that are tolerable     Examination:   BP 128/72  Pulse 85  Temp(Src) 98.3 F (36.8 C)  Resp 16  Ht 5' 6.5" (1.689 m)  Wt 195 lb 6.4 oz (88.633 kg)  BMI 31.07 kg/m2  SpO2 96%  Body mass index is 31.07 kg/(m^2).    ASSESSMENT/ PLAN:   Diabetes type 2 with obesity   The patient's diabetes control appears to  be fairly good with her  V. go pump and she is subjectively liking this Since this is improving her compliance will continue this Her fasting readings are relatively high but not high enough to increase her basal rate and also she is getting readings sometimes below 100 at lunchtime Most likely her overnight control of better if she takes extra Humalog at suppertime Discussed today the need for better control of her evening hyperglycemia with at least extra 2-4 units of Humalog with her pen She also will need extra Humalog with her pen for any nighttime snacks   However if her fasting readings are continued higher will need to add Lantus also She is planning to join a [employer] program and this may reduce her insulin requirement with weight loss    Gillian Kluever 06/22/2014, 9:11 AM

## 2014-06-22 NOTE — Patient Instructions (Signed)
Extra 3-5 Humalog with pen at dinner based on Carbs

## 2014-06-27 DIAGNOSIS — Z1231 Encounter for screening mammogram for malignant neoplasm of breast: Secondary | ICD-10-CM | POA: Diagnosis not present

## 2014-07-03 ENCOUNTER — Encounter: Payer: Medicare Other | Attending: Endocrinology | Admitting: Dietician

## 2014-07-03 ENCOUNTER — Encounter: Payer: Self-pay | Admitting: Dietician

## 2014-07-03 VITALS — Ht 66.0 in | Wt 192.6 lb

## 2014-07-03 DIAGNOSIS — Z794 Long term (current) use of insulin: Secondary | ICD-10-CM | POA: Insufficient documentation

## 2014-07-03 DIAGNOSIS — Z713 Dietary counseling and surveillance: Secondary | ICD-10-CM | POA: Insufficient documentation

## 2014-07-03 DIAGNOSIS — E1165 Type 2 diabetes mellitus with hyperglycemia: Secondary | ICD-10-CM

## 2014-07-03 DIAGNOSIS — IMO0002 Reserved for concepts with insufficient information to code with codable children: Secondary | ICD-10-CM

## 2014-07-03 NOTE — Progress Notes (Addendum)
Diabetes Self-Management Education  Visit Type:  Diabetes Education  Appt. Start Time: 930 Appt. End Time: 0932  07/03/2014  Crystal Hobbs, identified by name and date of birth, is a 63 y.o. female with a diagnosis of Diabetes: Type 2.  Other people present during visit:  Patient   ASSESSMENT  Height 5\' 6"  (1.676 m), weight 192 lb 9.6 oz (87.363 kg). Body mass index is 31.1 kg/(m^2).  Initial Visit Information:  Are you currently following a meal plan?: Yes (Nutrisystem)   Are you taking your medications as prescribed?: Yes Are you checking your feet?: Yes How many days per week are you checking your feet?: 5 How often do you need to have someone help you when you read instructions, pamphlets, or other written materials from your doctor or pharmacy?: 1 - Never What is the last grade level you completed in school?: 12th grade  Psychosocial:    Patient Belief/Attitude about Diabetes: Motivated to manage diabetes Self-care barriers: None Self-management support: Doctor's office;Family Other persons present: Patient Patient Concerns: Nutrition/Meal planning;Other (comment) (skin) Special Needs: None Preferred Learning Style: No preference indicated Learning Readiness: Ready  Complications:   Last HgB A1C per patient/outside source: 7.7 mg/dL How often do you check your blood sugar?: 1-2 times/day Fasting Blood glucose range (mg/dL): 130-179;70-129 (has been 100-115 in the past week after starting Nutrisystem) Postprandial Blood glucose range (mg/dL): 130-179 (does not regularly check postprandial) Number of hypoglycemic episodes per month: 3 Number of hyperglycemic episodes per week: 0 Have you had a dilated eye exam in the past 12 months?: Yes Have you had a dental exam in the past 12 months?: No  Diet Intake:  Breakfast: shake, omelette, donut, egg on toast whole wheat  Snack (morning): breakfast bar, boiled egg and fruit, or toast and egg Lunch: can of ravioli,  meatball sandwich, bar Snack (afternoon): boiled egg and fruit, or toast and egg Dinner: Nutrisystem  or 2.5 oz steak and salad Snack (evening): cookie or popcorn Beverage(s): 6 glasses of water and 20 oz diet drinks  Exercise:  Exercise: Moderate (swimming / aerobic walking);Light (walking / raking leaves) Light Exercise amount of time (min / week): 45 Moderate Exercise amount of time (min / week): 120  Individualized Plan for Diabetes Self-Management Training:   Learning Objective:  Patient will have a greater understanding of diabetes self-management.  Patient education plan per assessed needs and concerns is to attend individual sessions      Education Topics Reviewed with Patient Today:  Definition of diabetes, type 1 and 2, and the diagnosis of diabetes Role of diet in the treatment of diabetes and the relationship between the three main macronutrients and blood glucose level;Food label reading, portion sizes and measuring food.;Carbohydrate counting;Reviewed blood glucose goals for pre and post meals and how to evaluate the patients' food intake on their blood glucose level.;Information on hints to eating out and maintain blood glucose control.;Meal options for control of blood glucose level and chronic complications. Role of exercise on diabetes management, blood pressure control and cardiac health. Reviewed patients medication for diabetes, action, purpose, timing of dose and side effects. Purpose and frequency of SMBG.;Identified appropriate SMBG and/or A1C goals.;Interpreting lab values - A1C, lipid, urine microalbumina.;Daily foot exams;Yearly dilated eye exam Taught treatment of hypoglycemia - the 15 rule.;Discussed and identified patients' treatment of hyperglycemia.   Role of stress on diabetes      PATIENTS GOALS/Plan (Developed by the patient):  Nutrition: Follow meal plan discussed Physical Activity: Exercise 3-5 times  per week;30 minutes per day Medications: take  my medication as prescribed Monitoring : test my blood glucose as discussed (note x per day with comment)  Plan:   Patient Instructions  Goals:  Follow Diabetes Meal Plan as instructed  Eat 3 meals and 2 snacks, every 3-5 hrs  Limit carbohydrate intake to 30-45 grams carbohydrate/meal  Limit carbohydrate intake to 0-15 grams carbohydrate/snack  Add lean protein foods to meals/snacks  Monitor glucose levels as instructed by your doctor  Aim for 30 mins of physical activity daily  Bring food record and glucose log to your next nutrition visit    Expected Outcomes:  Demonstrated interest in learning. Expect positive outcomes  Education material provided: Living Well with Diabetes, Meal Card, Blood Glucose Monitoring, Diabetes Medications  If problems or questions, patient to contact team via:  phone  Future DSME appointment: PRN

## 2014-07-03 NOTE — Patient Instructions (Signed)

## 2014-08-21 ENCOUNTER — Other Ambulatory Visit (INDEPENDENT_AMBULATORY_CARE_PROVIDER_SITE_OTHER): Payer: Medicare Other

## 2014-08-21 DIAGNOSIS — E1165 Type 2 diabetes mellitus with hyperglycemia: Secondary | ICD-10-CM | POA: Diagnosis not present

## 2014-08-21 DIAGNOSIS — E063 Autoimmune thyroiditis: Secondary | ICD-10-CM

## 2014-08-21 DIAGNOSIS — E038 Other specified hypothyroidism: Secondary | ICD-10-CM

## 2014-08-21 DIAGNOSIS — IMO0002 Reserved for concepts with insufficient information to code with codable children: Secondary | ICD-10-CM

## 2014-08-21 LAB — BASIC METABOLIC PANEL
BUN: 19 mg/dL (ref 6–23)
CALCIUM: 9.4 mg/dL (ref 8.4–10.5)
CO2: 23 mEq/L (ref 19–32)
Chloride: 110 mEq/L (ref 96–112)
Creatinine, Ser: 1.2 mg/dL (ref 0.4–1.2)
GFR: 46.03 mL/min — AB (ref >60.00)
Glucose, Bld: 93 mg/dL (ref 70–99)
POTASSIUM: 4.2 meq/L (ref 3.5–5.1)
SODIUM: 141 meq/L (ref 135–145)

## 2014-08-21 LAB — HEMOGLOBIN A1C: Hgb A1c MFr Bld: 7 % — ABNORMAL HIGH (ref 4.6–6.5)

## 2014-08-21 LAB — TSH: TSH: 5.9 u[IU]/mL — AB (ref 0.35–4.50)

## 2014-08-24 ENCOUNTER — Ambulatory Visit: Payer: Medicare Other | Admitting: Endocrinology

## 2014-08-31 ENCOUNTER — Ambulatory Visit: Payer: Medicare Other | Admitting: Endocrinology

## 2014-09-06 ENCOUNTER — Ambulatory Visit (INDEPENDENT_AMBULATORY_CARE_PROVIDER_SITE_OTHER): Payer: Medicare Other | Admitting: Endocrinology

## 2014-09-06 ENCOUNTER — Other Ambulatory Visit: Payer: Self-pay | Admitting: *Deleted

## 2014-09-06 ENCOUNTER — Encounter: Payer: Self-pay | Admitting: Endocrinology

## 2014-09-06 VITALS — BP 160/97 | HR 71 | Temp 98.3°F | Resp 14 | Ht 66.5 in | Wt 182.0 lb

## 2014-09-06 DIAGNOSIS — E038 Other specified hypothyroidism: Secondary | ICD-10-CM

## 2014-09-06 DIAGNOSIS — E063 Autoimmune thyroiditis: Secondary | ICD-10-CM

## 2014-09-06 DIAGNOSIS — E119 Type 2 diabetes mellitus without complications: Secondary | ICD-10-CM

## 2014-09-06 MED ORDER — LEVOTHYROXINE SODIUM 88 MCG PO TABS: 88.0000 ug | ORAL_TABLET | Freq: Every day | ORAL | Status: DC

## 2014-09-06 MED ORDER — GLUCOSE BLOOD VI STRP: ORAL_STRIP | Status: DC

## 2014-09-06 NOTE — Progress Notes (Signed)
Patient ID: Crystal Hobbs, female   DOB: Sep 09, 1951, 63 y.o.   MRN: [id number]   Reason for Appointment: Diabetes follow-up   History of Present Illness   Diagnosis: Type 2 DIABETES MELITUS, date of diagnosis:  2011   Past history: Her initial presentation with diabetes was with significant symptoms and glucose of 947 Initially was treated with insulin and subsequently on metformin which she could not tolerate  Since she was not responding to Victoza and had significant hyperglycemia she was started on basal bolus insulin regimen instead  However had required relatively large doses of insulin with inadequate control and blood sugars subsequently improved significantly with adding Victoza in 10/13  Her  A1c was 6.4 in 4/14 and she had overall good control with minimal hypoglycemia She was taken off  Afrezza because of her high out-of-pocket expense and she was tried on V-go pump which she had some difficulties getting this through the pharmacy  RECENT history:  Her A1c was 7.7 prior to trying her on the V-go pump in 10/15 On her own she has joined a [employer] and is eating 6 small meals With this she has cut back on her caloric intake and has lost 13 pounds Also her insulin requirement has gone down with taking only 22 units of Lantus compared to 36 She now says that her blood sugars are not higher with meals and she is taking NovoLog only when she has a high carbohydrate meal  She did not bring her monitor for download and not clear if her blood sugars are well controlled during the day; usually not checking before supper or in the afternoon She is compliant with her Victoza but is only taking 0.51m because of tendency to low sugars in the mornings She has checked mostly in the morning and evening and has only one high recent reading A1c has come down to 7% already   Insulin regimen: Lantus 22 units before meals  Side effects from medications:  diarrhea from metformin,? Hair loss  from Actos Proper timing of medications in relation to meals: Yes        Monitors blood glucose: Once a day.    Glucometer: One Touch.          Blood Glucose readings from download:   PRE-MEAL Breakfast Lunch Dinner Bedtime Overall  Glucose range:  88-115    78, 88   94-242    Median:  110     114   105    Hypoglycemia: None recently      Meals: 3 meals per day. Bfst 1/2 PB Dinner at 6-8 pm        Physical activity: exercise: walking 2/7, 30-45 min             Diabetes education visit: Most recent: 94/0981          Complications: are: None     Wt Readings from Last 3 Encounters:  09/06/14 182 lb (82.555 kg)  07/03/14 192 lb 9.6 oz (87.363 kg)  06/22/14 195 lb 6.4 oz (88.633 kg)    Lab Results  Component Value Date   HGBA1C 7.0* 08/21/2014   HGBA1C 7.7* 05/15/2014   HGBA1C 7.2* 01/01/2014   Lab Results  Component Value Date   MICROALBUR 30.4* 05/15/2014   LDLCALC 48 04/04/2014   CREATININE 1.2 08/21/2014    No visits with results within 1 Week(s) from this visit. Latest known visit with results is:  Appointment on 08/21/2014  Component Date Value  Ref Range Status  . Hgb A1c MFr Bld 08/21/2014 7.0* 4.6 - 6.5 % Final   Glycemic Control Guidelines for People with Diabetes:Non Diabetic:  <6%Goal of Therapy: <7%Additional Action Suggested:  >8%   . Sodium 08/21/2014 141  135 - 145 mEq/L Final  . Potassium 08/21/2014 4.2  3.5 - 5.1 mEq/L Final  . Chloride 08/21/2014 110  96 - 112 mEq/L Final  . CO2 08/21/2014 23  19 - 32 mEq/L Final  . Glucose, Bld 08/21/2014 93  70 - 99 mg/dL Final  . BUN 08/21/2014 19  6 - 23 mg/dL Final  . Creatinine, Ser 08/21/2014 1.2  0.4 - 1.2 mg/dL Final  . Calcium 08/21/2014 9.4  8.4 - 10.5 mg/dL Final  . GFR 08/21/2014 46.03* >60.00 mL/min Final  . TSH 08/21/2014 5.90* 0.35 - 4.50 uIU/mL Final      Medication List       This list is accurate as of: 09/06/14 11:08 AM.  Always use your most recent med list.               aspirin 81 MG  tablet  Take 81 mg by mouth QID.     candesartan 16 MG tablet  Commonly known as:  ATACAND  Take 16 mg by mouth daily.     ezetimibe-simvastatin 10-40 MG per tablet  Commonly known as:  VYTORIN  Take 1 tablet by mouth daily.     glucose blood test strip  Commonly known as:  ONE TOUCH ULTRA TEST  Use as instructed to check blood sugars 3 times per day dx code 250.02     insulin glargine 100 UNIT/ML injection  Commonly known as:  LANTUS  Inject 22 Units into the skin at bedtime.     insulin lispro 100 UNIT/ML injection  Commonly known as:  HUMALOG  Use 70 units per day with V-Go kit     Insulin Pen Needle 31G X 5 MM Misc  Use as directed 5 times per day     levothyroxine 75 MCG tablet  Commonly known as:  SYNTHROID, LEVOTHROID  Take 1 tablet (75 mcg total) by mouth daily.     Liraglutide 18 MG/3ML Sopn  Commonly known as:  VICTOZA  Inject 1.2 mg into the skin daily.     losartan 100 MG tablet  Commonly known as:  COZAAR  Take 1 tablet (100 mg total) by mouth daily.     omega-3 acid ethyl esters 1 G capsule  Commonly known as:  LOVAZA  Take 2 g by mouth 2 (two) times daily.     V-GO 30 Kit  Use one per day        Allergies:  Allergies  Allergen Reactions  . Actos [Pioglitazone]     hairloss  . Metformin And Related Diarrhea  . Novocain [Procaine]     syncopy  . Sulfa Antibiotics Itching    Past Medical History  Diagnosis Date  . Diabetes mellitus without complication   . Dyslipidemia   . Vertigo   . Complication of anesthesia     takes along time to wake up  . Arthritis   . Contact lens/glasses fitting     wears glasses or contacts  . Snores   . Hypothyroidism   . Coronary artery disease 01/2007    s/p PCI of diagonal   . Hyperlipidemia   . Hypertension     Past Surgical History  Procedure Laterality Date  . Cardiac catheterization  2008  stent  . Shoulder arthroscopy with open rotator cuff repair Right 02/28/2013    Procedure: right  shoulder arthroscopy, distal clavicle sculpting, open rotator cuff repair;  Surgeon: Cammie Sickle., MD;  Location: [facility];  Service: Orthopedics;  Laterality: Right;    No family history on file.  Social History:  reports that she has never smoked. She does not have any smokeless tobacco history on file. She reports that she drinks alcohol. She reports that she does not use illicit drugs.  Review of Systems:  HYPERTENSION:  followed by PCP/cardiologist, blood pressure appears higher today  HYPERLIPIDEMIA: The lipid abnormality consists of elevated LDL, now taking Vytorin instead of simvastatin, followed by cardiologist.  Lab Results  Component Value Date   CHOL 110 04/04/2014   HDL 36.00* 04/04/2014   LDLCALC 48 04/04/2014   LDLDIRECT 121.2 05/08/2013   TRIG 128.0 04/04/2014   CHOLHDL 3 04/04/2014    History of mild hypothyroidism, previously treated by PCP  Does have periodic fatigability recently and some cold intolerance TSH is relatively higher   Lab Results  Component Value Date   TSH 5.90* 08/21/2014   TSH 5.76* 05/15/2014   TSH 2.72 01/01/2014   Has some symptoms of tingling/burning in her feet that are not requiring medications      Examination:   BP 160/97 mmHg  Pulse 71  Temp(Src) 98.3 F (36.8 C)  Resp 14  Ht 5' 6.5" (1.689 m)  Wt 182 lb (82.555 kg)  BMI 28.94 kg/m2  SpO2 97%  Body mass index is 28.94 kg/(m^2).   ASSESSMENT/ PLAN:   Diabetes type 2   The patient's diabetes control appears to much better with her changing her diet radically She has lost a lot of weight and is requiring much less basal insulin and usually no mealtime coverage She thinks she can continue this regimen Since her recent fasting readings are over 110 she can increase her Victoza to 1.2 and if needed reduce her Lantus She needs consistent exercise also   HYPOTHYROIDISM: Her TSH is still relatively high with 75 g and she can go up to 88  g  Hypertension: Follow-up with PCP/cardiologist  Sanav Remer 09/06/2014, 11:08 AM

## 2014-09-06 NOTE — Patient Instructions (Addendum)
Victoza 1.2mg daily 

## 2014-09-19 ENCOUNTER — Other Ambulatory Visit: Payer: Self-pay | Admitting: *Deleted

## 2014-10-05 ENCOUNTER — Other Ambulatory Visit: Payer: Medicare Other

## 2014-10-08 ENCOUNTER — Other Ambulatory Visit (INDEPENDENT_AMBULATORY_CARE_PROVIDER_SITE_OTHER): Payer: Medicare Other | Admitting: *Deleted

## 2014-10-08 ENCOUNTER — Other Ambulatory Visit: Payer: Self-pay | Admitting: *Deleted

## 2014-10-08 DIAGNOSIS — E785 Hyperlipidemia, unspecified: Secondary | ICD-10-CM | POA: Diagnosis not present

## 2014-10-08 LAB — HEPATIC FUNCTION PANEL
ALK PHOS: 108 U/L (ref 39–117)
ALT: 21 U/L (ref 0–35)
AST: 18 U/L (ref 0–37)
Albumin: 4 g/dL (ref 3.5–5.2)
BILIRUBIN DIRECT: 0 mg/dL (ref 0.0–0.3)
TOTAL PROTEIN: 7.3 g/dL (ref 6.0–8.3)
Total Bilirubin: 0.4 mg/dL (ref 0.2–1.2)

## 2014-10-08 LAB — LIPID PANEL
Cholesterol: 104 mg/dL (ref 0–200)
HDL: 36 mg/dL — AB (ref >39.00)
LDL CALC: 36 mg/dL (ref 0–99)
NonHDL: 68
Total CHOL/HDL Ratio: 3
Triglycerides: 160 mg/dL — ABNORMAL HIGH (ref 0.0–149.0)
VLDL: 32 mg/dL (ref 0.0–40.0)

## 2014-10-08 MED ORDER — INSULIN GLARGINE 100 UNIT/ML SOLOSTAR PEN: 34.0000 [IU] | PEN_INJECTOR | Freq: Every day | SUBCUTANEOUS | Status: DC

## 2014-10-22 ENCOUNTER — Other Ambulatory Visit: Payer: Self-pay | Admitting: *Deleted

## 2014-10-22 MED ORDER — EZETIMIBE-SIMVASTATIN 10-40 MG PO TABS: 1.0000 | ORAL_TABLET | Freq: Every day | ORAL | Status: DC

## 2014-11-07 ENCOUNTER — Encounter: Payer: Self-pay | Admitting: Physician Assistant

## 2014-11-07 ENCOUNTER — Ambulatory Visit (INDEPENDENT_AMBULATORY_CARE_PROVIDER_SITE_OTHER): Payer: Medicare Other | Admitting: Physician Assistant

## 2014-11-07 ENCOUNTER — Other Ambulatory Visit: Payer: Self-pay | Admitting: Physician Assistant

## 2014-11-07 ENCOUNTER — Telehealth: Payer: Self-pay | Admitting: Cardiology

## 2014-11-07 VITALS — BP 130/80 | HR 83 | Ht 66.0 in | Wt 181.0 lb

## 2014-11-07 DIAGNOSIS — R0789 Other chest pain: Secondary | ICD-10-CM

## 2014-11-07 MED ORDER — LOSARTAN POTASSIUM 100 MG PO TABS: 100.0000 mg | ORAL_TABLET | Freq: Every day | ORAL | Status: DC

## 2014-11-07 MED ORDER — NITROGLYCERIN 0.4 MG SL SUBL: 0.4000 mg | SUBLINGUAL_TABLET | SUBLINGUAL | Status: AC | PRN

## 2014-11-07 NOTE — Progress Notes (Signed)
Cardiology Office Note   Date:  11/07/2014   ID:  Crystal Hobbs, DOB Jul 10, 1951, MRN [id number]  PCP:  Reginia Naas, MD  Cardiologist: Dr. Radford Pax     History of Present Illness: Crystal Hobbs is a 64 y.o. female with a history of CAD s/p BMS to D1 (2008), HTN, DMT2 and dyslipidemia who was added onto the flex schedule due to chest tightness while playing tennis and medication confusion.   She has been working hard to control her diabetes, HLD and weight over the last year. She has lost 20 lbs since October and recent lipid panel looked great. She ran out of Losartan Friday and started taking old atacan instead (this medication is at least 66 years old.) On Monday night she was playing tennis and just didn't "feel right" due to tightness in her chest. She has also been having some back pain. She was playing tennis this AM when she got exertional chest tightness reminiscent of the chest pain prior to stent placement in 2008. This was relieved with rest. She also had a dizzy spell. It has since resolved. The chest pain was associated with SOB. No diaphoresis or nausea. Very similar  A little dizziness. No orthopnea, PND or LE edema. No blood in stool or urine. She is feeling well now with no residual CP  Past Medical History  Diagnosis Date  . Diabetes mellitus without complication   . Dyslipidemia   . Vertigo   . Complication of anesthesia     takes along time to wake up  . Arthritis   . Contact lens/glasses fitting     wears glasses or contacts  . Snores   . Hypothyroidism   . Coronary artery disease 01/2007    s/p PCI of diagonal   . Hyperlipidemia   . Hypertension     Past Surgical History  Procedure Laterality Date  . Cardiac catheterization  2008    stent  . Shoulder arthroscopy with open rotator cuff repair Right 02/28/2013    Procedure: right shoulder arthroscopy, distal clavicle sculpting, open rotator cuff repair;  Surgeon: Cammie Sickle., MD;  Location:  [facility];  Service: Orthopedics;  Laterality: Right;     Current Outpatient Prescriptions  Medication Sig Dispense Refill  . aspirin 81 MG tablet Take 81 mg by mouth QID. PT STATES SHE TAKES 3-4 ASA QD    . ezetimibe-simvastatin (VYTORIN) 10-40 MG per tablet Take 1 tablet by mouth daily. 30 tablet 0  . glucose blood (ONE TOUCH ULTRA TEST) test strip Use as instructed to check blood sugars 3 times per day dx code E11.65 300 each 1  . Insulin Glargine (LANTUS SOLOSTAR) 100 UNIT/ML Solostar Pen Inject 34 Units into the skin daily at 10 pm. 15 pen 1  . insulin lispro (HUMALOG) 100 UNIT/ML injection Use 70 units per day with V-Go kit (Patient taking differently: 10 Units. Takes 10 units every 10 days) 30 mL 3  . Insulin Pen Needle 31G X 5 MM MISC Use as directed 5 times per day 150 each 3  . levothyroxine (SYNTHROID, LEVOTHROID) 88 MCG tablet Take 1 tablet (88 mcg total) by mouth daily. 30 tablet 3  . Liraglutide (VICTOZA) 18 MG/3ML SOPN Inject 1.2 mg into the skin daily. (Patient taking differently: Inject 0.9 mg into the skin daily. ) 6 pen 5  . losartan (COZAAR) 100 MG tablet Take 1 tablet (100 mg total) by mouth daily. 30 tablet 5  . omega-3  acid ethyl esters (LOVAZA) 1 G capsule Take 2 g by mouth 2 (two) times daily.    . nitroGLYCERIN (NITROSTAT) 0.4 MG SL tablet Place 1 tablet (0.4 mg total) under the tongue every 5 (five) minutes as needed for chest pain. 90 tablet 3   No current facility-administered medications for this visit.    Allergies:   Actos; Metformin and related; Novocain; and Sulfa antibiotics    Social History:  The patient  reports that she has never smoked. She does not have any smokeless tobacco history on file. She reports that she drinks alcohol. She reports that she does not use illicit drugs.   Family History:  The patient's family history is not on file.    ROS:  Please see the history of present illness.   Otherwise, review of systems are  positive for none.   All other systems are reviewed and negative.    PHYSICAL EXAM: VS:  BP 130/80 mmHg  Pulse 83  Ht 5' 6"  (1.676 m)  Wt 181 lb (82.101 kg)  BMI 29.23 kg/m2 , BMI Body mass index is 29.23 kg/(m^2). GEN: Well nourished, well developed, in no acute distress HEENT: normal Neck: no JVD, carotid bruits, or masses Cardiac: RRR; no murmurs, rubs, or gallops,no edema  Respiratory:  clear to auscultation bilaterally, normal work of breathing GI: soft, nontender, nondistended, + BS MS: no deformity or atrophy Skin: warm and dry, no rash Neuro:  Strength and sensation are intact Psych: euthymic mood, full affect   EKG:  EKG is ordered today. The ekg ordered today demonstrates NSR HR 83. No acute ST or TW changes.    Recent Labs: 08/21/2014: BUN 19; Creatinine 1.2; Potassium 4.2; Sodium 141; TSH 5.90* 10/08/2014: ALT 21    Lipid Panel    Component Value Date/Time   CHOL 104 10/08/2014 1030   TRIG 160.0* 10/08/2014 1030   HDL 36.00* 10/08/2014 1030   CHOLHDL 3 10/08/2014 1030   VLDL 32.0 10/08/2014 1030   LDLCALC 36 10/08/2014 1030   LDLDIRECT 121.2 05/08/2013 0852      Wt Readings from Last 3 Encounters:  11/07/14 181 lb (82.101 kg)  09/06/14 182 lb (82.555 kg)  07/03/14 192 lb 9.6 oz (87.363 kg)      Other studies Reviewed: Additional studies/ records that were reviewed today include: [place] 2008 Review of the above records demonstrates:  -- [facility] (01/2007): low normal ejection fraction of 50% and 90% lesion of the first diagonal status post PTCA/stent on Feb 09, 2007.   ASSESSMENT AND PLAN:  Crystal Hobbs is a 64 y.o. female with a history of CAD s/p BMS to D1 (2008), HTN, DMT2 and dyslipidemia who was added onto the flex schedule due to chest tightness while playing tennis and medication confusion.   Chest pain/CAD s/p BMS to D1 (2008). She is having exertional chest pain reminiscent of sx prior to her stent placement in 2008. ECG today with no acute ST  or TW changes.  -- Will send her home with SL NTG and arrange for an ETT nuclear stress test in the next week. If abnormal she will likely need to be set up for cath.  -- Continue ASA and statin. No BB due to hx of bradycardia per old notes.   HTN - BP well controlled. 130/55mHG today.  -- Will refill losartan 1090m(patient ran out)  Dyslipidemia - her last lipids 10/08/14 showed an LDL of 36 and HDL 36 -- Continue Vytorin 10-40mg and fish oil  Hypothyroidism- continue synthroid  DM- continue to follow closely with Dr. Dwyane Dee  Current medicines are reviewed at length with the patient today.  The patient does not have concerns regarding medicines.  The following changes have been made:  no change  Labs/ tests ordered today include:   Orders Placed This Encounter  Procedures  . Myocardial Perfusion Imaging  . EKG 12-Lead     Disposition:   FU with Dr. Heron Nay in early march as previously scheduled.    Renea Ee  11/07/2014 3:24 PM    [facility] [place] [address], [place], [place]  [address] Phone: [phone]; Fax: [phone]

## 2014-11-07 NOTE — Telephone Encounter (Signed)
I have not seen her in 1.5 years and when I saw her she was on ATacand.  Who switched her to Losartan?  Please find out from pharmacy - also work her in with extender for evaluation

## 2014-11-07 NOTE — Telephone Encounter (Signed)
Patient st Dr. Radford Pax changed her to Losartan.  OV scheduled with flex today at 1430.

## 2014-11-07 NOTE — Telephone Encounter (Signed)
Patient ran out of Losartan Friday and started taking old atacan instead. She st it is at least 66 years old. Patient st Monday night she was playing tennis and just didn't "feel right." Her chest got "a little tight." She st on Friday, she had a massage and her back has been sore ever since, but she isn't sure if it started hurting worse during her episode Monday. Patient does not c/o any chest pain, just intermittent mild tightness since. Today when she played tennis her chest got tight again and she had a dizzy spell. It has since resolved. Patient st she overreacted when she called, and that her symptoms are due to overexertion and taking expired medications.  Explained to patient the importance of taking medications as directed and made an OV with Dr. Radford Pax 3/11.  Instructed patient to call the office if she has anymore chest pain or symptoms worsen.  Losartan refilled until OV.

## 2014-11-07 NOTE — Patient Instructions (Addendum)
Your physician recommends that you continue on your current medications as directed. Please refer to the Current Medication list given to you today.   RX OF LOSARTAN SENT INTO YOUR PHARMACY  Your physician has requested that you have en exercise stress myoview. For further information please visit [email or web address]. Please follow instruction sheet, as given.   KEEP APPT WITH DR TURNER AS SCHEDULED

## 2014-11-07 NOTE — Telephone Encounter (Signed)
New message       Pt c/o of Chest Pain: STAT if CP now or developed within 24 hours  1. Are you having CP right now?  Not chest pain but feeling tight---(do not think it is heart related) 2. Are you experiencing any other symptoms (ex. SOB, nausea, vomiting, sweating)? no 3. How long have you been experiencing CP? Started monday  4. Is your CP continuous or coming and going? Comes and go  5. Have you taken Nitroglycerin? no Pt played tennis this am and Monday night.---pt ran out of losartan----took last pill Friday.  She had some atacan and took that.  Tightness may be from not getting losartan refilled. ?

## 2014-11-09 ENCOUNTER — Ambulatory Visit (HOSPITAL_COMMUNITY): Payer: Medicare Other | Attending: Cardiovascular Disease | Admitting: Radiology

## 2014-11-09 ENCOUNTER — Encounter (HOSPITAL_COMMUNITY): Payer: Medicare Other

## 2014-11-09 DIAGNOSIS — R0789 Other chest pain: Secondary | ICD-10-CM | POA: Diagnosis not present

## 2014-11-09 DIAGNOSIS — R0602 Shortness of breath: Secondary | ICD-10-CM | POA: Insufficient documentation

## 2014-11-09 DIAGNOSIS — R42 Dizziness and giddiness: Secondary | ICD-10-CM | POA: Insufficient documentation

## 2014-11-09 DIAGNOSIS — Z9861 Coronary angioplasty status: Secondary | ICD-10-CM | POA: Insufficient documentation

## 2014-11-09 DIAGNOSIS — R079 Chest pain, unspecified: Secondary | ICD-10-CM | POA: Diagnosis not present

## 2014-11-09 DIAGNOSIS — I2510 Atherosclerotic heart disease of native coronary artery without angina pectoris: Secondary | ICD-10-CM | POA: Diagnosis not present

## 2014-11-09 DIAGNOSIS — E109 Type 1 diabetes mellitus without complications: Secondary | ICD-10-CM | POA: Diagnosis not present

## 2014-11-09 DIAGNOSIS — I10 Essential (primary) hypertension: Secondary | ICD-10-CM | POA: Insufficient documentation

## 2014-11-09 MED ORDER — TECHNETIUM TC 99M SESTAMIBI GENERIC - CARDIOLITE
30.0000 | Freq: Once | INTRAVENOUS | Status: AC | PRN
  Administered 2014-11-09: 30 via INTRAVENOUS

## 2014-11-09 MED ORDER — TECHNETIUM TC 99M SESTAMIBI GENERIC - CARDIOLITE
10.0000 | Freq: Once | INTRAVENOUS | Status: AC | PRN
  Administered 2014-11-09: 10 via INTRAVENOUS

## 2014-11-09 NOTE — Progress Notes (Signed)
[name] [address] [place], [place] [address] [phone]    Cardiology Nuclear Med Study  [name] is a 64 y.o. female     MRN : [id number]     DOB: October 22, 1950  Procedure Date: 11/09/2014  Nuclear Med Background Indication for Stress Test:  Evaluation for Ischemia and Follow-up CAD History:  CAD stent Cardiac Risk Factors: Hypertension and IDDM Type 1  Symptoms:  Chest Pain, Dizziness, Light-Headedness and SOB   Nuclear Pre-Procedure Caffeine/Decaff Intake:  8:00pm NPO After: 7:30pm   Lungs:  clear O2 Sat: 94% on room air. IV 0.9% NS with Angio Cath:  22g  IV Site: R Forearm  IV Started by:  Ileene Hutchinson, EMT-P  Chest Size (in):  38 Cup Size: D  Height: 5\' 6"  (1.676 m)  Weight:  180 lb (81.647 kg)  BMI:  Body mass index is 29.07 kg/(m^2). Tech Comments:  n/a    Nuclear Med Study 1 or 2 day study: 1 day  Stress Test Type:  Stress  Reading MD: n/a  Order Authorizing Provider:  Jenkins Rouge, MD  Resting Radionuclide: Technetium 70m Sestamibi  Resting Radionuclide Dose: 11.0 mCi   Stress Radionuclide:  Technetium 43m Sestamibi  Stress Radionuclide Dose: 33.0 mCi           Stress Protocol Rest HR: 56 Stress HR: 139  Rest BP: 116/72 Stress BP: 180/59  Exercise Time (min): 7:30 METS: 9.4   Predicted Max HR: 154 bpm % Max HR: 90.26 bpm Rate Pressure Product: 25020   Dose of Adenosine (mg):  n/a Dose of Lexiscan: n/a mg  Dose of Atropine (mg): n/a Dose of Dobutamine: n/a mcg/kg/min (at max HR)  Stress Test Technologist: Glade Lloyd, BS-ES  Nuclear Technologist:  Earl Many, CNMT     Rest Procedure:  Myocardial perfusion imaging was performed at rest 45 minutes following the intravenous administration of Technetium 71m Sestamibi. Rest ECG: Sinus bradycardia 56 bpm  Anteroseptal MI    Stress Procedure:  The patient exercised on the treadmill utilizing the Bruce Protocol for 7:30 minutes. The patient stopped due to fatigue  and denied any chest pain.  Technetium 33m Sestamibi was injected at peak exercise and myocardial perfusion imaging was performed after a brief delay. Stress ECG: Extensive motion artifact.  No definite ST changes to suggest ischemia   QPS Raw Data Images:  Soft tissue (diaphragm, bowel activity) undelie inferior wall   Stress Images:Mild thinning in the inferoseptal base  Otherwise normal perfusion.   Rest Images:  Comparison with the stress images reveals no significant change. Subtraction (SDS):  No evidence of ischemia. Transient Ischemic Dilatation (Normal <1.22):  0.92 Lung/Heart Ratio (Normal <0.45):  0.34  Quantitative Gated Spect Images QGS EDV:  63 ml QGS ESV:  15 ml  Impression Exercise Capacity:  Fair exercise capacity. BP Response:  Normal blood pressure response. Clinical Symptoms:  No chest pain. ECG Impression:  No significant ST segment change suggestive of ischemia. Comparison with Prior Nuclear Study:No priior study    Overall Impression:  Normal stress nuclear study.  LV Ejection Fraction: 76%.  LV Wall Motion:  NL LV Function; NL Wall Motion   Crystal Hobbs

## 2014-11-13 NOTE — Telephone Encounter (Signed)
New problem ° ° °Pt returning a call from nurse. Please call pt. °

## 2014-11-13 NOTE — Telephone Encounter (Signed)
Patient aware of stress test results per result notes.

## 2014-11-23 ENCOUNTER — Encounter: Payer: Self-pay | Admitting: Cardiology

## 2014-11-23 ENCOUNTER — Ambulatory Visit (INDEPENDENT_AMBULATORY_CARE_PROVIDER_SITE_OTHER): Payer: Medicare Other | Admitting: Cardiology

## 2014-11-23 VITALS — BP 130/74 | HR 62 | Ht 65.5 in | Wt 185.0 lb

## 2014-11-23 DIAGNOSIS — E785 Hyperlipidemia, unspecified: Secondary | ICD-10-CM

## 2014-11-23 DIAGNOSIS — I2510 Atherosclerotic heart disease of native coronary artery without angina pectoris: Secondary | ICD-10-CM | POA: Diagnosis not present

## 2014-11-23 DIAGNOSIS — I10 Essential (primary) hypertension: Secondary | ICD-10-CM

## 2014-11-23 DIAGNOSIS — I2583 Coronary atherosclerosis due to lipid rich plaque: Principal | ICD-10-CM

## 2014-11-23 NOTE — Addendum Note (Signed)
Addended by: Sueanne Margarita on: 11/23/2014 04:37 PM   Modules accepted: Miquel Dunn

## 2014-11-23 NOTE — Progress Notes (Signed)
Cardiology Office Note   Date:  11/23/2014   ID:  KARRIE FLUELLEN, DOB [birth year], MRN [id number]  PCP:  Reginia Naas, MD  Cardiologist:   Sueanne Margarita, MD   Chief Complaint  Patient presents with  . Chest Pain  . Coronary Artery Disease  . Hypertension      History of Present Illness: Crystal Hobbs is a 64 y.o. female with a history of ASCAD with BMS to D1 in 2008, HTN and dyslipidemia who presents today for followup. She was recently seen by the PA for chest pain while playing tennis.  She described it as a tightness similar to the pain she had prior to her stent.  It was relieved with rest.  She also had a dizzy spell.  It was associated with SOB.  She is back for followup today and her chest pain has resolved.  She currently  denies any chest pain, SOB or DOE after starting back playing tennis.  She dizziness, palpitations or syncope. Her LE edema has improved with losing weight.  She underwent nuclear stress test that did not show any evidence of ischemia and normal LVF.     Past Medical History  Diagnosis Date  . Diabetes mellitus without complication   . Dyslipidemia   . Vertigo   . Complication of anesthesia     takes along time to wake up  . Arthritis   . Contact lens/glasses fitting     wears glasses or contacts  . Snores   . Hypothyroidism   . Coronary artery disease 01/2007    s/p PCI of diagonal   . Hyperlipidemia   . Hypertension     Past Surgical History  Procedure Laterality Date  . Cardiac catheterization  2008    stent  . Shoulder arthroscopy with open rotator cuff repair Right 02/28/2013    Procedure: right shoulder arthroscopy, distal clavicle sculpting, open rotator cuff repair;  Surgeon: Cammie Sickle., MD;  Location: [facility];  Service: Orthopedics;  Laterality: Right;     Current Outpatient Prescriptions  Medication Sig Dispense Refill  . aspirin 81 MG tablet Take 81 mg by mouth QID. PT STATES SHE TAKES 3-4  ASA QD    . ezetimibe-simvastatin (VYTORIN) 10-40 MG per tablet Take 1 tablet by mouth daily. 30 tablet 0  . glucose blood (ONE TOUCH ULTRA TEST) test strip Use as instructed to check blood sugars 3 times per day dx code E11.65 300 each 1  . Insulin Glargine (LANTUS SOLOSTAR) 100 UNIT/ML Solostar Pen Inject 34 Units into the skin daily at 10 pm. (Patient taking differently: Inject 22 Units into the skin daily at 10 pm. ) 15 pen 1  . Insulin Pen Needle 31G X 5 MM MISC Use as directed 5 times per day 150 each 3  . levothyroxine (SYNTHROID, LEVOTHROID) 88 MCG tablet Take 1 tablet (88 mcg total) by mouth daily. 30 tablet 3  . Liraglutide (VICTOZA) 18 MG/3ML SOPN Inject 1.2 mg into the skin daily. (Patient taking differently: Inject 0.9 mg into the skin daily. ) 6 pen 5  . losartan (COZAAR) 100 MG tablet Take 1 tablet (100 mg total) by mouth daily. 30 tablet 5  . nitroGLYCERIN (NITROSTAT) 0.4 MG SL tablet Place 1 tablet (0.4 mg total) under the tongue every 5 (five) minutes as needed for chest pain. 90 tablet 3  . omega-3 acid ethyl esters (LOVAZA) 1 G capsule Take 2 g by mouth 2 (two) times daily.  No current facility-administered medications for this visit.    Allergies:   Actos; Metformin and related; Novocain; and Sulfa antibiotics    Social History:  The patient  reports that she has never smoked. She does not have any smokeless tobacco history on file. She reports that she drinks alcohol. She reports that she does not use illicit drugs.   Family History:  The patient's family history includes Heart attack in her father; Hypertension in her father.    ROS:  Please see the history of present illness.   Otherwise, review of systems are positive for none.   All other systems are reviewed and negative.    PHYSICAL EXAM: VS:  BP 130/74 mmHg  Pulse 62  Ht 5' 5.5" (1.664 m)  Wt 185 lb (83.915 kg)  BMI 30.31 kg/m2  SpO2 96% , BMI Body mass index is 30.31 kg/(m^2). GEN: Well nourished, well  developed, in no acute distress HEENT: normal Neck: no JVD, carotid bruits, or masses Cardiac: RRR; no murmurs, rubs, or gallops,no edema  Respiratory:  clear to auscultation bilaterally, normal work of breathing GI: soft, nontender, nondistended, + BS MS: no deformity or atrophy Skin: warm and dry, no rash Neuro:  Strength and sensation are intact Psych: euthymic mood, full affect   EKG:  EKG is not ordered today.    Recent Labs: 08/21/2014: BUN 19; Creatinine 1.2; Potassium 4.2; Sodium 141; TSH 5.90* 10/08/2014: ALT 21    Lipid Panel    Component Value Date/Time   CHOL 104 10/08/2014 1030   TRIG 160.0* 10/08/2014 1030   HDL 36.00* 10/08/2014 1030   CHOLHDL 3 10/08/2014 1030   VLDL 32.0 10/08/2014 1030   LDLCALC 36 10/08/2014 1030   LDLDIRECT 121.2 05/08/2013 0852      Wt Readings from Last 3 Encounters:  11/23/14 185 lb (83.915 kg)  11/09/14 180 lb (81.647 kg)  11/07/14 181 lb (82.101 kg)    ASSESSMENT AND PLAN:  1. ASCAD with no furtherangina.  Recent nuclear stress test was fine. - continue ASA 2. HTN - BP well controlled - continue Candesartan 3. Dyslipidemia - LDL at goal at 36 - continue Vytorin   Current medicines are reviewed at length with the patient today.  The patient does not have concerns regarding medicines.  The following changes have been made:  no change  Labs/ tests ordered today include:  No orders of the defined types were placed in this encounter.     Disposition:   FU with me in 1 year   Signed, Sueanne Margarita, MD  11/23/2014 10:43 AM    [facility] [place], [place],   [address] Phone: [phone]; Fax: [phone]

## 2014-11-23 NOTE — Patient Instructions (Signed)
Your physician recommends that you continue on your current medications as directed. Please refer to the Current Medication list given to you today.   Your physician wants you to follow-up in: 6 months with Dr. Radford Pax. You will receive a reminder letter in the mail two months in advance. If you don't receive a letter, please call our office to schedule the follow-up appointment @ [phone].

## 2014-12-03 ENCOUNTER — Other Ambulatory Visit (INDEPENDENT_AMBULATORY_CARE_PROVIDER_SITE_OTHER): Payer: Medicare Other

## 2014-12-03 DIAGNOSIS — E119 Type 2 diabetes mellitus without complications: Secondary | ICD-10-CM | POA: Diagnosis not present

## 2014-12-03 DIAGNOSIS — E038 Other specified hypothyroidism: Secondary | ICD-10-CM | POA: Diagnosis not present

## 2014-12-03 DIAGNOSIS — E063 Autoimmune thyroiditis: Secondary | ICD-10-CM

## 2014-12-03 LAB — COMPREHENSIVE METABOLIC PANEL
ALK PHOS: 85 U/L (ref 39–117)
ALT: 22 U/L (ref 0–35)
AST: 23 U/L (ref 0–37)
Albumin: 4.1 g/dL (ref 3.5–5.2)
BILIRUBIN TOTAL: 0.4 mg/dL (ref 0.2–1.2)
BUN: 18 mg/dL (ref 6–23)
CHLORIDE: 107 meq/L (ref 96–112)
CO2: 26 mEq/L (ref 19–32)
CREATININE: 1.07 mg/dL (ref 0.40–1.20)
Calcium: 9.9 mg/dL (ref 8.4–10.5)
GFR: 54.52 mL/min — ABNORMAL LOW (ref >60.00)
GLUCOSE: 91 mg/dL (ref 70–99)
Potassium: 4.2 mEq/L (ref 3.5–5.1)
Sodium: 142 mEq/L (ref 135–145)
Total Protein: 7.2 g/dL (ref 6.0–8.3)

## 2014-12-03 LAB — TSH: TSH: 5.18 u[IU]/mL — ABNORMAL HIGH (ref 0.35–4.50)

## 2014-12-03 LAB — HEMOGLOBIN A1C: Hgb A1c MFr Bld: 7.1 % — ABNORMAL HIGH (ref 4.6–6.5)

## 2014-12-06 ENCOUNTER — Other Ambulatory Visit: Payer: Self-pay | Admitting: *Deleted

## 2014-12-06 ENCOUNTER — Encounter: Payer: Self-pay | Admitting: Endocrinology

## 2014-12-06 ENCOUNTER — Ambulatory Visit (INDEPENDENT_AMBULATORY_CARE_PROVIDER_SITE_OTHER): Payer: Medicare Other | Admitting: Endocrinology

## 2014-12-06 ENCOUNTER — Other Ambulatory Visit: Payer: Self-pay

## 2014-12-06 VITALS — BP 161/79 | HR 66 | Temp 98.0°F | Resp 16 | Ht 65.5 in | Wt 182.2 lb

## 2014-12-06 DIAGNOSIS — E1165 Type 2 diabetes mellitus with hyperglycemia: Secondary | ICD-10-CM | POA: Diagnosis not present

## 2014-12-06 DIAGNOSIS — I2510 Atherosclerotic heart disease of native coronary artery without angina pectoris: Secondary | ICD-10-CM

## 2014-12-06 DIAGNOSIS — E038 Other specified hypothyroidism: Secondary | ICD-10-CM | POA: Diagnosis not present

## 2014-12-06 DIAGNOSIS — E785 Hyperlipidemia, unspecified: Secondary | ICD-10-CM | POA: Diagnosis not present

## 2014-12-06 DIAGNOSIS — E063 Autoimmune thyroiditis: Secondary | ICD-10-CM

## 2014-12-06 DIAGNOSIS — IMO0002 Reserved for concepts with insufficient information to code with codable children: Secondary | ICD-10-CM

## 2014-12-06 MED ORDER — LIRAGLUTIDE 18 MG/3ML ~~LOC~~ SOPN: 1.8000 mg | PEN_INJECTOR | Freq: Every day | SUBCUTANEOUS | Status: DC

## 2014-12-06 MED ORDER — LEVOTHYROXINE SODIUM 100 MCG PO TABS: 100.0000 ug | ORAL_TABLET | Freq: Every day | ORAL | Status: DC

## 2014-12-06 MED ORDER — LIRAGLUTIDE 18 MG/3ML ~~LOC~~ SOPN: 1.2000 mg | PEN_INJECTOR | Freq: Every day | SUBCUTANEOUS | Status: DC

## 2014-12-06 MED ORDER — EZETIMIBE-SIMVASTATIN 10-40 MG PO TABS: 1.0000 | ORAL_TABLET | Freq: Every day | ORAL | Status: DC

## 2014-12-06 NOTE — Progress Notes (Signed)
Patient ID: Crystal Hobbs, female   DOB: 25-Oct-1950, 64 y.o.   MRN: [id number]   Reason for Appointment: Diabetes follow-up   History of Present Illness   Diagnosis: Type 2 DIABETES MELITUS, date of diagnosis:  2011   Past history: Her initial presentation with diabetes was with significant symptoms and glucose of 947 Initially was treated with insulin and subsequently on metformin which she could not tolerate  Since she was not responding to Victoza and had significant hyperglycemia she was started on basal bolus insulin regimen instead  However had required relatively large doses of insulin with inadequate control and blood sugars subsequently improved significantly with adding Victoza in 10/13  Her  A1c was 6.4 in 4/14 and she had overall good control with minimal hypoglycemia She was taken off  Afrezza because of her high out-of-pocket expense and she was tried on V-go pump which she had some difficulties getting this through the pharmacy  RECENT history:  She has been able to get her blood sugars fairly well controlled with using the Nutrisystem diet since about 12/15 Also she was able to reduce her basal insulin requirement significantly Also was not needing mealtime coverage with Humalog since she was eating 3-6 small meals However since her last visit she has gained back some weight that she had lost and is having sporadic high readings including after evening meals She is not able to follow her previous diet consistently and with her work schedule not able to structure 6 small meals.  However still trying to watch her portions and usually watching carbohydrates and fat intake well  She has been taking her mealtime insulin more regularly since she was seeing her blood sugars going up after meals; now taking 5-10 units of Humalog based on meal size but mostly in the evening at suppertime; occasionally may not be able to compliant this and glucose may be higher after supper/  bedtime She has however not checked blood sugars after breakfast or lunch FASTING blood sugars are fairly good overall but averaging about 130 A1c is about the same She is compliant with her Victoza and his is taking 1.2 mg   Insulin regimen: Lantus 22 units Humalog 5-10 before meals  Side effects from medications:  diarrhea from metformin,? Hair loss from Actos Proper timing of medications in relation to meals: Yes        Monitors blood glucose: Once a day.    Glucometer: One Touch.          Blood Glucose readings from download:   PRE-MEAL Breakfast Lunch Dinner Bedtime Overall  Glucose range: 90-152  90   83-129   122-241    median: 133    131   Hypoglycemia: None recently      Meals: 3 meals per day.  Breakfast: may have half a peanut better sandwich.   Dinner at 6-8 pm        Physical activity: exercise: walking 2/7, 30-45 min , recently starting to play tennis            Diabetes education visit: Most recent: 05/3266           Complications: are: None     Wt Readings from Last 3 Encounters:  12/06/14 182 lb 3.2 oz (82.645 kg)  11/23/14 185 lb (83.915 kg)  11/09/14 180 lb (81.647 kg)    Lab Results  Component Value Date   HGBA1C 7.1* 12/03/2014   HGBA1C 7.0* 08/21/2014   HGBA1C 7.7* 05/15/2014  Lab Results  Component Value Date   MICROALBUR 30.4* 05/15/2014   LDLCALC 36 10/08/2014   CREATININE 1.07 12/03/2014    Lab on 12/03/2014  Component Date Value Ref Range Status  . Hgb A1c MFr Bld 12/03/2014 7.1* 4.6 - 6.5 % Final   Glycemic Control Guidelines for People with Diabetes:Non Diabetic:  <6%Goal of Therapy: <7%Additional Action Suggested:  >8%   . Sodium 12/03/2014 142  135 - 145 mEq/L Final  . Potassium 12/03/2014 4.2  3.5 - 5.1 mEq/L Final  . Chloride 12/03/2014 107  96 - 112 mEq/L Final  . CO2 12/03/2014 26  19 - 32 mEq/L Final  . Glucose, Bld 12/03/2014 91  70 - 99 mg/dL Final  . BUN 12/03/2014 18  6 - 23 mg/dL Final  . Creatinine, Ser 12/03/2014 1.07   0.40 - 1.20 mg/dL Final  . Total Bilirubin 12/03/2014 0.4  0.2 - 1.2 mg/dL Final  . Alkaline Phosphatase 12/03/2014 85  39 - 117 U/L Final  . AST 12/03/2014 23  0 - 37 U/L Final  . ALT 12/03/2014 22  0 - 35 U/L Final  . Total Protein 12/03/2014 7.2  6.0 - 8.3 g/dL Final  . Albumin 12/03/2014 4.1  3.5 - 5.2 g/dL Final  . Calcium 12/03/2014 9.9  8.4 - 10.5 mg/dL Final  . GFR 12/03/2014 54.52* >60.00 mL/min Final  . TSH 12/03/2014 5.18* 0.35 - 4.50 uIU/mL Final      Medication List       This list is accurate as of: 12/06/14 10:39 AM.  Always use your most recent med list.               aspirin 81 MG tablet  Take 81 mg by mouth QID. PT STATES SHE TAKES 3-4 ASA QD     ezetimibe-simvastatin 10-40 MG per tablet  Commonly known as:  VYTORIN  Take 1 tablet by mouth daily.     glucose blood test strip  Commonly known as:  ONE TOUCH ULTRA TEST  Use as instructed to check blood sugars 3 times per day dx code E11.65     Insulin Glargine 100 UNIT/ML Solostar Pen  Commonly known as:  LANTUS SOLOSTAR  Inject 34 Units into the skin daily at 10 pm.     Insulin Pen Needle 31G X 5 MM Misc  Use as directed 5 times per day     levothyroxine 88 MCG tablet  Commonly known as:  SYNTHROID, LEVOTHROID  Take 1 tablet (88 mcg total) by mouth daily.     Liraglutide 18 MG/3ML Sopn  Commonly known as:  VICTOZA  Inject 0.2 mLs (1.2 mg total) into the skin daily.     losartan 100 MG tablet  Commonly known as:  COZAAR  Take 1 tablet (100 mg total) by mouth daily.     nitroGLYCERIN 0.4 MG SL tablet  Commonly known as:  NITROSTAT  Place 1 tablet (0.4 mg total) under the tongue every 5 (five) minutes as needed for chest pain.     omega-3 acid ethyl esters 1 G capsule  Commonly known as:  LOVAZA  Take 2 g by mouth 2 (two) times daily.        Allergies:  Allergies  Allergen Reactions  . Actos [Pioglitazone]     hairloss  . Metformin And Related Diarrhea  . Novocain [Procaine]      syncopy  . Sulfa Antibiotics Itching    Past Medical History  Diagnosis Date  . Diabetes mellitus  without complication   . Dyslipidemia   . Vertigo   . Complication of anesthesia     takes along time to wake up  . Arthritis   . Contact lens/glasses fitting     wears glasses or contacts  . Snores   . Hypothyroidism   . Coronary artery disease 01/2007    s/p PCI of diagonal   . Hyperlipidemia   . Hypertension     Past Surgical History  Procedure Laterality Date  . Cardiac catheterization  2008    stent  . Shoulder arthroscopy with open rotator cuff repair Right 02/28/2013    Procedure: right shoulder arthroscopy, distal clavicle sculpting, open rotator cuff repair;  Surgeon: Cammie Sickle., MD;  Location: [facility];  Service: Orthopedics;  Laterality: Right;    Family History  Problem Relation Age of Onset  . Hypertension Father   . Heart attack Father     Social History:  reports that she has never smoked. She does not have any smokeless tobacco history on file. She reports that she drinks alcohol. She reports that she does not use illicit drugs.  Review of Systems:  HYPERTENSION:  followed by PCP/cardiologist, blood pressure  higher today, has not refilled the medications for 5 days Home BP checked periodically and systolic reading usually about 130, using wrist monitor  HYPERLIPIDEMIA: The lipid abnormality consists of elevated LDL, now taking Vytorin instead of simvastatin, followed by cardiologist.  Lab Results  Component Value Date   CHOL 104 10/08/2014   HDL 36.00* 10/08/2014   LDLCALC 36 10/08/2014   LDLDIRECT 121.2 05/08/2013   TRIG 160.0* 10/08/2014   CHOLHDL 3 10/08/2014    History of mild hypothyroidism, previously treated by PCP  She has required progressively higher doses of supplement.  Although she does not complain of any unusual fatigue or TSH is still slightly high despite increasing her dose to 88 g   Lab Results    Component Value Date   TSH 5.18* 12/03/2014   TSH 5.90* 08/21/2014   TSH 5.76* 05/15/2014   Has some symptoms of tingling/burning in her feet but not requiring medications      Examination:   BP 161/79 mmHg  Pulse 66  Temp(Src) 98 F (36.7 C)  Resp 16  Ht 5' 5.5" (1.664 m)  Wt 182 lb 3.2 oz (82.645 kg)  BMI 29.85 kg/m2  SpO2 98%  Body mass index is 29.85 kg/(m^2).   No pedal edema  ASSESSMENT/ PLAN:   Diabetes type 2   The patient's diabetes control appears to be fairly good overall even though she is starting to gain back some weight and not able to follow her Nutrisystem diet consistently Still needing to take some mealtime coverage with larger meals especially with more carbohydrate but usually much less than her previous doses Her weight is starting to go up and discussed that she may benefit from going up to the maximum dose of Victoza This should also help her get more consistent control of her fasting glucose and improved satiety  Also she has started exercising more recently and should be able to start back losing weight again Since her fasting blood sugars are fairly good with 22 units of Lantus will continue the same dose; does have a few readings below 100 also recently Discussed needing to check blood sugars at various times and discussed blood sugar targets including after meals She is reluctant to use carbohydrate counting for mealtime insulin adjustment but may consider  this on the next visit  HYPOTHYROIDISM: Her TSH is still relatively high with 88 g and she can go up to 100 g  Hypertension: Follow-up with PCP/cardiologist and take her medication regularly which she has not been able to get recently  HYPERLIPIDEMIA: She is complaining about the cost of Vytorin.  Discussed that since she does not have CAD and has a LDL down to 36 may do well with simvastatin alone and she can discuss this with her cardiologist again  Patient Instructions  Victoza 1.8 mg  daily  Please check blood sugars at least half the time about 2 hours after any meal and 3 times per week on waking up. Please bring blood sugar monitor to each visit. Recommended blood sugar levels about 2 hours after meal is 140-160 and on waking up 90-130    Counseling time over 50% of today's 25 minute visit  Crystal Hobbs 12/06/2014, 10:39 AM

## 2014-12-06 NOTE — Patient Instructions (Addendum)
Victoza 1.8 mg daily  Please check blood sugars at least half the time about 2 hours after any meal and 3 times per week on waking up. Please bring blood sugar monitor to each visit. Recommended blood sugar levels about 2 hours after meal is 140-160 and on waking up 90-130

## 2015-01-28 ENCOUNTER — Other Ambulatory Visit: Payer: Self-pay

## 2015-01-28 NOTE — Telephone Encounter (Signed)
Medication Detail      Disp Refills Start End     losartan (COZAAR) 100 MG tablet 30 tablet 5 11/07/2014     Sig - Route: Take 1 tablet (100 mg total) by mouth daily. - Oral    E-Prescribing Status: Receipt confirmed by pharmacy (11/07/2014 3:15 PM EST)

## 2015-01-29 ENCOUNTER — Other Ambulatory Visit: Payer: Self-pay

## 2015-01-29 MED ORDER — LOSARTAN POTASSIUM 100 MG PO TABS: 100.0000 mg | ORAL_TABLET | Freq: Every day | ORAL | Status: DC

## 2015-02-13 ENCOUNTER — Other Ambulatory Visit: Payer: Self-pay | Admitting: *Deleted

## 2015-02-13 MED ORDER — INSULIN GLARGINE 100 UNIT/ML SOLOSTAR PEN: 34.0000 [IU] | PEN_INJECTOR | Freq: Every day | SUBCUTANEOUS | Status: DC

## 2015-03-05 ENCOUNTER — Other Ambulatory Visit (INDEPENDENT_AMBULATORY_CARE_PROVIDER_SITE_OTHER): Payer: Medicare Other

## 2015-03-05 DIAGNOSIS — IMO0002 Reserved for concepts with insufficient information to code with codable children: Secondary | ICD-10-CM

## 2015-03-05 DIAGNOSIS — E038 Other specified hypothyroidism: Secondary | ICD-10-CM | POA: Diagnosis not present

## 2015-03-05 DIAGNOSIS — E063 Autoimmune thyroiditis: Secondary | ICD-10-CM

## 2015-03-05 DIAGNOSIS — E1165 Type 2 diabetes mellitus with hyperglycemia: Secondary | ICD-10-CM

## 2015-03-05 LAB — COMPREHENSIVE METABOLIC PANEL
ALT: 20 U/L (ref 0–35)
AST: 19 U/L (ref 0–37)
Albumin: 4.2 g/dL (ref 3.5–5.2)
Alkaline Phosphatase: 100 U/L (ref 39–117)
BUN: 22 mg/dL (ref 6–23)
CALCIUM: 9.7 mg/dL (ref 8.4–10.5)
CHLORIDE: 108 meq/L (ref 96–112)
CO2: 26 mEq/L (ref 19–32)
CREATININE: 1.07 mg/dL (ref 0.40–1.20)
GFR: 54.47 mL/min — ABNORMAL LOW (ref >60.00)
Glucose, Bld: 138 mg/dL — ABNORMAL HIGH (ref 70–99)
Potassium: 4.5 mEq/L (ref 3.5–5.1)
SODIUM: 140 meq/L (ref 135–145)
TOTAL PROTEIN: 7.4 g/dL (ref 6.0–8.3)
Total Bilirubin: 0.3 mg/dL (ref 0.2–1.2)

## 2015-03-05 LAB — T4, FREE: Free T4: 0.9 ng/dL (ref 0.60–1.60)

## 2015-03-05 LAB — HEMOGLOBIN A1C: Hgb A1c MFr Bld: 7 % — ABNORMAL HIGH (ref 4.6–6.5)

## 2015-03-05 LAB — TSH: TSH: 2.17 u[IU]/mL (ref 0.35–4.50)

## 2015-03-08 ENCOUNTER — Ambulatory Visit (INDEPENDENT_AMBULATORY_CARE_PROVIDER_SITE_OTHER): Payer: Medicare Other | Admitting: Endocrinology

## 2015-03-08 ENCOUNTER — Encounter: Payer: Self-pay | Admitting: Endocrinology

## 2015-03-08 VITALS — BP 142/86 | HR 71 | Temp 98.1°F | Resp 16 | Ht 65.5 in | Wt 183.4 lb

## 2015-03-08 DIAGNOSIS — I2510 Atherosclerotic heart disease of native coronary artery without angina pectoris: Secondary | ICD-10-CM

## 2015-03-08 DIAGNOSIS — I10 Essential (primary) hypertension: Secondary | ICD-10-CM

## 2015-03-08 DIAGNOSIS — E1165 Type 2 diabetes mellitus with hyperglycemia: Secondary | ICD-10-CM

## 2015-03-08 DIAGNOSIS — E038 Other specified hypothyroidism: Secondary | ICD-10-CM

## 2015-03-08 DIAGNOSIS — E063 Autoimmune thyroiditis: Secondary | ICD-10-CM

## 2015-03-08 DIAGNOSIS — IMO0002 Reserved for concepts with insufficient information to code with codable children: Secondary | ICD-10-CM

## 2015-03-08 DIAGNOSIS — E785 Hyperlipidemia, unspecified: Secondary | ICD-10-CM | POA: Diagnosis not present

## 2015-03-08 NOTE — Patient Instructions (Signed)
Take Humalog BEFORE DINNER  Lantus 24-26  Check blood sugars on waking up .. 3 .. times a week Also check blood sugars about 2 hours after a meal and do this after different meals by rotation  Recommended blood sugar levels on waking up is 90-130 and about 2 hours after meal is 140-180 Please bring blood sugar monitor to each visit.

## 2015-03-08 NOTE — Progress Notes (Signed)
Patient ID: Crystal Hobbs, female   DOB: 1951-05-08, 64 y.o.   MRN: [id number]   Reason for Appointment: Diabetes follow-up   History of Present Illness   Diagnosis: Type 2 DIABETES MELITUS, date of diagnosis:  2011   Past history: Her initial presentation with diabetes was with significant symptoms and glucose of 947 Initially was treated with insulin and subsequently on metformin which she could not tolerate  Since she was not responding to Victoza and had significant hyperglycemia she was started on basal bolus insulin regimen instead  However had required relatively large doses of insulin with inadequate control and blood sugars subsequently improved significantly with adding Victoza in 10/13  Her  A1c was 6.4 in 4/14 and she had overall good control with minimal hypoglycemia She was taken off  Afrezza because of her high out-of-pocket expense and she was tried on V-go pump which she had some difficulties getting this through the pharmacy  RECENT history:  Her A1c is still about the same at 7% Previously was able to reduce her insulin requirement significantly by going on the [place] since about 12/15 More recently she is back on regular meal plans and mostly eating 3 meals a day She was asked to try increasing her Victoza to 1.8 mg on the last visit but because of the cost she added back to 1.2  Current blood sugar patterns and problems identified:  Her fasting blood sugars are about the same as before and mildly increased  Sometimes fasting blood sugars are higher based on her diet the night before and blood sugars at bedtime  She is frequently forgetting to take her Humalog before supper and sometimes will take it an hour after eating  Has a couple of readings over 200 after supper most likely from not taking Humalog  She had been fairly consistent with her exercise but has not lost any more weight  Blood sugars are not usually higher on lunch and supper  and generally watching carbohydrates at breakfast and lunch  She is compliant with her Victoza   Insulin regimen: Lantus 22-24 units Humalog 5-10 before or after dinner  Side effects from medications:  diarrhea from metformin,? Hair loss from Actos Proper timing of medications in relation to meals: Yes        Monitors blood glucose: a day.    Glucometer: One Touch.          Blood Glucose readings from download:   Mean values apply above for all meters except median for One Touch  PRE-MEAL Fasting Lunch Dinner Bedtime Overall  Glucose range:  122-169   83-132   87-124   127-215    Mean/median: 134    165  133    Hypoglycemia: None recently      Meals: 3 meals per day.  Breakfast: may have half a peanut better sandwich.   Dinner at 6-8 pm        Physical activity: exercise: walking 2/7, 30-45 minutes or playing tennis on other days            Diabetes education visit: Most recent: 10/2480           Complications: are: None     Wt Readings from Last 3 Encounters:  03/08/15 183 lb 6.4 oz (83.19 kg)  12/06/14 182 lb 3.2 oz (82.645 kg)  11/23/14 185 lb (83.915 kg)    Lab Results  Component Value Date   HGBA1C 7.0* 03/05/2015   HGBA1C 7.1*  12/03/2014   HGBA1C 7.0* 08/21/2014   Lab Results  Component Value Date   MICROALBUR 30.4* 05/15/2014   LDLCALC 36 10/08/2014   CREATININE 1.07 03/05/2015    Lab on 03/05/2015  Component Date Value Ref Range Status  . Hgb A1c MFr Bld 03/05/2015 7.0* 4.6 - 6.5 % Final   Glycemic Control Guidelines for People with Diabetes:Non Diabetic:  <6%Goal of Therapy: <7%Additional Action Suggested:  >8%   . Sodium 03/05/2015 140  135 - 145 mEq/L Final  . Potassium 03/05/2015 4.5  3.5 - 5.1 mEq/L Final  . Chloride 03/05/2015 108  96 - 112 mEq/L Final  . CO2 03/05/2015 26  19 - 32 mEq/L Final  . Glucose, Bld 03/05/2015 138* 70 - 99 mg/dL Final  . BUN 03/05/2015 22  6 - 23 mg/dL Final  . Creatinine, Ser 03/05/2015 1.07  0.40 - 1.20 mg/dL Final  .  Total Bilirubin 03/05/2015 0.3  0.2 - 1.2 mg/dL Final  . Alkaline Phosphatase 03/05/2015 100  39 - 117 U/L Final  . AST 03/05/2015 19  0 - 37 U/L Final  . ALT 03/05/2015 20  0 - 35 U/L Final  . Total Protein 03/05/2015 7.4  6.0 - 8.3 g/dL Final  . Albumin 03/05/2015 4.2  3.5 - 5.2 g/dL Final  . Calcium 03/05/2015 9.7  8.4 - 10.5 mg/dL Final  . GFR 03/05/2015 54.47* >60.00 mL/min Final  . TSH 03/05/2015 2.17  0.35 - 4.50 uIU/mL Final  . Free T4 03/05/2015 0.90  0.60 - 1.60 ng/dL Final      Medication List       This list is accurate as of: 03/08/15  2:48 PM.  Always use your most recent med list.               aspirin 81 MG tablet  Take 81 mg by mouth QID. PT STATES SHE TAKES 3-4 ASA QD     ezetimibe-simvastatin 10-40 MG per tablet  Commonly known as:  VYTORIN  Take 1 tablet by mouth daily.     glucose blood test strip  Commonly known as:  ONE TOUCH ULTRA TEST  Use as instructed to check blood sugars 3 times per day dx code E11.65     Insulin Glargine 100 UNIT/ML Solostar Pen  Commonly known as:  LANTUS SOLOSTAR  Inject 34 Units into the skin daily at 10 pm.     Insulin Pen Needle 31G X 5 MM Misc  Use as directed 5 times per day     levothyroxine 100 MCG tablet  Commonly known as:  SYNTHROID, LEVOTHROID  Take 1 tablet (100 mcg total) by mouth daily.     Liraglutide 18 MG/3ML Sopn  Commonly known as:  VICTOZA  Inject 0.3 mLs (1.8 mg total) into the skin daily.     losartan 100 MG tablet  Commonly known as:  COZAAR  Take 1 tablet (100 mg total) by mouth daily.     nitroGLYCERIN 0.4 MG SL tablet  Commonly known as:  NITROSTAT  Place 1 tablet (0.4 mg total) under the tongue every 5 (five) minutes as needed for chest pain.     omega-3 acid ethyl esters 1 G capsule  Commonly known as:  LOVAZA  Take 2 g by mouth 2 (two) times daily.        Allergies:  Allergies  Allergen Reactions  . Actos [Pioglitazone]     hairloss  . Metformin And Related Diarrhea  .  Novocain [Procaine]  syncopy  . Sulfa Antibiotics Itching    Past Medical History  Diagnosis Date  . Diabetes mellitus without complication   . Dyslipidemia   . Vertigo   . Complication of anesthesia     takes along time to wake up  . Arthritis   . Contact lens/glasses fitting     wears glasses or contacts  . Snores   . Hypothyroidism   . Coronary artery disease 01/2007    s/p PCI of diagonal   . Hyperlipidemia   . Hypertension     Past Surgical History  Procedure Laterality Date  . Cardiac catheterization  2008    stent  . Shoulder arthroscopy with open rotator cuff repair Right 02/28/2013    Procedure: right shoulder arthroscopy, distal clavicle sculpting, open rotator cuff repair;  Surgeon: Cammie Sickle., MD;  Location: [facility];  Service: Orthopedics;  Laterality: Right;    Family History  Problem Relation Age of Onset  . Hypertension Father   . Heart attack Father     Social History:  reports that she has never smoked. She does not have any smokeless tobacco history on file. She reports that she drinks alcohol. She reports that she does not use illicit drugs.  Review of Systems:  HYPERTENSION:  followed by PCP/cardiologist, blood pressure  higher today, has not refilled the medications for 5 days Home BP checked periodically and systolic reading usually about 130, using wrist monitor  HYPERLIPIDEMIA: The lipid abnormality consists of elevated LDL, taking Vytorin, followed by cardiologist.  She was told by cardiologist not to change this even though her LDL is only 36  Lab Results  Component Value Date   CHOL 104 10/08/2014   HDL 36.00* 10/08/2014   LDLCALC 36 10/08/2014   LDLDIRECT 121.2 05/08/2013   TRIG 160.0* 10/08/2014   CHOLHDL 3 10/08/2014    History of mild hypothyroidism, previously treated by PCP  She has required progressively higher doses of levothyroxine.  She is now taking 100 g daily since 11/2014 and does feel less  tired   Lab Results  Component Value Date   TSH 2.17 03/05/2015   TSH 5.18* 12/03/2014   TSH 5.90* 08/21/2014   Has some symptoms of tingling/burning in her feet but not requiring medications      Examination:   BP 142/86 mmHg  Pulse 71  Temp(Src) 98.1 F (36.7 C)  Resp 16  Ht 5' 5.5" (1.664 m)  Wt 183 lb 6.4 oz (83.19 kg)  BMI 30.04 kg/m2  SpO2 96%  Body mass index is 30.04 kg/(m^2).   Repeat blood pressure 140/86  No pedal edema  ASSESSMENT/ PLAN:   Diabetes type 2   The patient's diabetes control appears to be fairly good overall with A1c 7% Her main problem is compliance with her suppertime Humalog and and to have periodic high readings after evening meal as discussed above Fasting blood sugars are mildly high She has been fairly good with exercise and overall calorie control with her diet He does not appear to need mealtime insulin at breakfast and lunch especially with low carbohydrate intake and also taking Victoza  Have recommended that she be consistent with her Humalog before supper time and adjust the dose based on meal size More blood sugars after supper Increase Lantus by at least 2 units and keep morning sugars at least under 130  HYPOTHYROIDISM: Her TSH back to normal with 100 g of levothyroxine and she is subjectively feeling better  Hypertension:  Follow-up with PCP/cardiologist as blood pressure is relatively higher today  HYPERLIPIDEMIA: She may do well with Lipitor 80 mg alone, she is concerned about the cost of Vytorin and will discuss again with PCP or cardiologist  Patient Instructions  Take Humalog BEFORE DINNER  Lantus 24-26  Check blood sugars on waking up .. 3 .. times a week Also check blood sugars about 2 hours after a meal and do this after different meals by rotation  Recommended blood sugar levels on waking up is 90-130 and about 2 hours after meal is 140-180 Please bring blood sugar monitor to each visit.   Counseling time on  subjects discussed above is over 50% of today's 25 minute visit   Kizer Nobbe 03/08/2015, 2:48 PM

## 2015-03-11 DIAGNOSIS — E119 Type 2 diabetes mellitus without complications: Secondary | ICD-10-CM | POA: Diagnosis not present

## 2015-03-15 ENCOUNTER — Other Ambulatory Visit: Payer: Self-pay | Admitting: *Deleted

## 2015-03-15 MED ORDER — EZETIMIBE-SIMVASTATIN 10-40 MG PO TABS: 1.0000 | ORAL_TABLET | Freq: Every day | ORAL | Status: DC

## 2015-03-28 DIAGNOSIS — E039 Hypothyroidism, unspecified: Secondary | ICD-10-CM | POA: Diagnosis not present

## 2015-03-28 DIAGNOSIS — E780 Pure hypercholesterolemia: Secondary | ICD-10-CM | POA: Diagnosis not present

## 2015-03-28 DIAGNOSIS — I2510 Atherosclerotic heart disease of native coronary artery without angina pectoris: Secondary | ICD-10-CM | POA: Diagnosis not present

## 2015-05-18 ENCOUNTER — Other Ambulatory Visit: Payer: Self-pay | Admitting: Endocrinology

## 2015-06-04 ENCOUNTER — Other Ambulatory Visit: Payer: Self-pay | Admitting: Endocrinology

## 2015-06-05 ENCOUNTER — Other Ambulatory Visit: Payer: Medicare Other

## 2015-06-10 ENCOUNTER — Ambulatory Visit: Payer: Medicare Other | Admitting: Endocrinology

## 2015-06-11 ENCOUNTER — Other Ambulatory Visit: Payer: Self-pay | Admitting: *Deleted

## 2015-06-11 ENCOUNTER — Telehealth: Payer: Self-pay | Admitting: Endocrinology

## 2015-06-11 MED ORDER — LIRAGLUTIDE 18 MG/3ML ~~LOC~~ SOPN: 1.8000 mg | PEN_INJECTOR | Freq: Every day | SUBCUTANEOUS | Status: DC

## 2015-06-11 MED ORDER — GLUCOSE BLOOD VI STRP: ORAL_STRIP | Status: DC

## 2015-06-11 NOTE — Telephone Encounter (Signed)
rx sent

## 2015-06-11 NOTE — Telephone Encounter (Signed)
Test strips and victoza rx needs to be called into [facility] please

## 2015-06-19 ENCOUNTER — Other Ambulatory Visit: Payer: Self-pay | Admitting: Endocrinology

## 2015-06-28 ENCOUNTER — Other Ambulatory Visit (INDEPENDENT_AMBULATORY_CARE_PROVIDER_SITE_OTHER): Payer: Medicare Other

## 2015-06-28 DIAGNOSIS — IMO0002 Reserved for concepts with insufficient information to code with codable children: Secondary | ICD-10-CM

## 2015-06-28 DIAGNOSIS — E785 Hyperlipidemia, unspecified: Secondary | ICD-10-CM

## 2015-06-28 DIAGNOSIS — E1165 Type 2 diabetes mellitus with hyperglycemia: Secondary | ICD-10-CM

## 2015-06-28 LAB — LIPID PANEL
Cholesterol: 112 mg/dL (ref 0–200)
HDL: 40.6 mg/dL (ref >39.00)
LDL CALC: 51 mg/dL (ref 0–99)
NONHDL: 71.39
Total CHOL/HDL Ratio: 3
Triglycerides: 104 mg/dL (ref 0.0–149.0)
VLDL: 20.8 mg/dL (ref 0.0–40.0)

## 2015-06-28 LAB — COMPREHENSIVE METABOLIC PANEL
ALT: 23 U/L (ref 0–35)
AST: 23 U/L (ref 0–37)
Albumin: 4.1 g/dL (ref 3.5–5.2)
Alkaline Phosphatase: 85 U/L (ref 39–117)
BUN: 20 mg/dL (ref 6–23)
CHLORIDE: 108 meq/L (ref 96–112)
CO2: 27 mEq/L (ref 19–32)
Calcium: 9.7 mg/dL (ref 8.4–10.5)
Creatinine, Ser: 1.1 mg/dL (ref 0.40–1.20)
GFR: 52.71 mL/min — ABNORMAL LOW (ref >60.00)
GLUCOSE: 109 mg/dL — AB (ref 70–99)
Potassium: 4.4 mEq/L (ref 3.5–5.1)
SODIUM: 141 meq/L (ref 135–145)
Total Bilirubin: 0.6 mg/dL (ref 0.2–1.2)
Total Protein: 7.3 g/dL (ref 6.0–8.3)

## 2015-06-28 LAB — HEMOGLOBIN A1C: HEMOGLOBIN A1C: 7.3 % — AB (ref 4.6–6.5)

## 2015-07-01 ENCOUNTER — Other Ambulatory Visit: Payer: Self-pay | Admitting: *Deleted

## 2015-07-01 MED ORDER — INSULIN GLARGINE 100 UNIT/ML SOLOSTAR PEN: 24.0000 [IU] | PEN_INJECTOR | Freq: Every day | SUBCUTANEOUS | Status: DC

## 2015-07-01 NOTE — Telephone Encounter (Signed)
Please see below.

## 2015-07-01 NOTE — Telephone Encounter (Signed)
rx sent

## 2015-07-01 NOTE — Telephone Encounter (Signed)
Patient need prescription for Lantus pens renewed send to  [facility] [address], [place] [phone] (Phone) [phone] (Fax)

## 2015-07-04 ENCOUNTER — Encounter: Payer: Self-pay | Admitting: Endocrinology

## 2015-07-04 ENCOUNTER — Ambulatory Visit (INDEPENDENT_AMBULATORY_CARE_PROVIDER_SITE_OTHER): Payer: Medicare Other | Admitting: Endocrinology

## 2015-07-04 VITALS — BP 112/80 | HR 62 | Temp 98.2°F | Resp 14 | Ht 65.5 in | Wt 188.2 lb

## 2015-07-04 DIAGNOSIS — I2510 Atherosclerotic heart disease of native coronary artery without angina pectoris: Secondary | ICD-10-CM

## 2015-07-04 DIAGNOSIS — Z794 Long term (current) use of insulin: Secondary | ICD-10-CM | POA: Diagnosis not present

## 2015-07-04 DIAGNOSIS — E1165 Type 2 diabetes mellitus with hyperglycemia: Secondary | ICD-10-CM

## 2015-07-04 LAB — MICROALBUMIN / CREATININE URINE RATIO
CREATININE, U: 45.9 mg/dL
MICROALB UR: 8.7 mg/dL — AB (ref 0.0–1.9)
Microalb Creat Ratio: 18.9 mg/g (ref 0.0–30.0)

## 2015-07-04 NOTE — Progress Notes (Signed)
Patient ID: Crystal Hobbs, female   DOB: [birth year], 64 y.o.   MRN: [id number]   Reason for Appointment: Diabetes follow-up   History of Present Illness   Diagnosis: Type 2 DIABETES MELITUS, date of diagnosis:  2011   Past history: Her initial presentation with diabetes was with significant symptoms and glucose of 947 Initially was treated with insulin and subsequently on metformin which she could not tolerate  Since she was not responding to Victoza and had significant hyperglycemia she was started on basal bolus insulin regimen instead  However had required relatively large doses of insulin with inadequate control and blood sugars subsequently improved significantly with adding Victoza in 10/13  Her  A1c was 6.4 in 4/14 and she had overall good control with minimal hypoglycemia She was taken off  Afrezza because of her high out-of-pocket expense and she was tried on V-go pump which she had some difficulties getting this through the pharmacy  RECENT history:    Insulin regimen: Lantus 26 units Humalog 6-10 before dinner    She is on a regimen of basal bolus insulin regimen along with Victoza Her A1c is slightly higher at 7.3  She thinks that she has not been as consistent with diet as before with some resulting weight gain   She takes only 1.2 mg Victoza because of the higher cost of the 1.8 dosage She did not bring her blood sugar monitor today and not clear what her blood sugar readings are actually  Current blood sugar patterns and problems identified:  Her fasting blood sugars are mildly increased, she wants them to be about 130  She is usually again is not checking blood sugars after breakfast or lunch  Blood sugars about 3 hours after supper are usually slightly high, about 140-170  She has not forgotten her Humalog as much at suppertime  She is compliant with her Victoza without any difficulties, taking 1.2 mg   Side effects from medications:  diarrhea from  metformin,? Hair loss from Actos Proper timing of medications in relation to meals: Yes        Monitors blood glucose: a day.    Glucometer: One Touch.          Blood Glucose readings from recall:  Mean values apply above for all meters except median for One Touch  PRE-MEAL Fasting Lunch Dinner Bedtime Overall  Glucose range: 132-138   140-170   Mean/median:        Hypoglycemia: None recently      Meals: 3 meals per day.  Breakfast: may have half a peanut better sandwich.   Dinner at 6-8 pm        Physical activity: exercise: walking 2-3/7, 30-60 minutes or playing tennis on other days            Diabetes education visit: Most recent: 12/8544           Complications: are: None     Wt Readings from Last 3 Encounters:  07/04/15 188 lb 3.2 oz (85.367 kg)  03/08/15 183 lb 6.4 oz (83.19 kg)  12/06/14 182 lb 3.2 oz (82.645 kg)    Lab Results  Component Value Date   HGBA1C 7.3* 06/28/2015   HGBA1C 7.0* 03/05/2015   HGBA1C 7.1* 12/03/2014   Lab Results  Component Value Date   MICROALBUR 30.4* 05/15/2014   LDLCALC 51 06/28/2015   CREATININE 1.10 06/28/2015    Appointment on 06/28/2015  Component Date Value Ref Range Status  . Hgb  A1c MFr Bld 06/28/2015 7.3* 4.6 - 6.5 % Final   Glycemic Control Guidelines for People with Diabetes:Non Diabetic:  <6%Goal of Therapy: <7%Additional Action Suggested:  >8%   . Sodium 06/28/2015 141  135 - 145 mEq/L Final  . Potassium 06/28/2015 4.4  3.5 - 5.1 mEq/L Final  . Chloride 06/28/2015 108  96 - 112 mEq/L Final  . CO2 06/28/2015 27  19 - 32 mEq/L Final  . Glucose, Bld 06/28/2015 109* 70 - 99 mg/dL Final  . BUN 06/28/2015 20  6 - 23 mg/dL Final  . Creatinine, Ser 06/28/2015 1.10  0.40 - 1.20 mg/dL Final  . Total Bilirubin 06/28/2015 0.6  0.2 - 1.2 mg/dL Final  . Alkaline Phosphatase 06/28/2015 85  39 - 117 U/L Final  . AST 06/28/2015 23  0 - 37 U/L Final  . ALT 06/28/2015 23  0 - 35 U/L Final  . Total Protein 06/28/2015 7.3  6.0 - 8.3 g/dL  Final  . Albumin 06/28/2015 4.1  3.5 - 5.2 g/dL Final  . Calcium 06/28/2015 9.7  8.4 - 10.5 mg/dL Final  . GFR 06/28/2015 52.71* >60.00 mL/min Final  . Cholesterol 06/28/2015 112  0 - 200 mg/dL Final   ATP III Classification       Desirable:  < 200 mg/dL               Borderline High:  200 - 239 mg/dL          High:  > = 240 mg/dL  . Triglycerides 06/28/2015 104.0  0.0 - 149.0 mg/dL Final   Normal:  <150 mg/dLBorderline High:  150 - 199 mg/dL  . HDL 06/28/2015 40.60  >39.00 mg/dL Final  . VLDL 06/28/2015 20.8  0.0 - 40.0 mg/dL Final  . LDL Cholesterol 06/28/2015 51  0 - 99 mg/dL Final  . Total CHOL/HDL Ratio 06/28/2015 3   Final                  Men          Women1/2 Average Risk     3.4          3.3Average Risk          5.0          4.42X Average Risk          9.6          7.13X Average Risk          15.0          11.0                      . NonHDL 06/28/2015 71.39   Final   NOTE:  Non-HDL goal should be 30 mg/dL higher than patient's LDL goal (i.e. LDL goal of < 70 mg/dL, would have non-HDL goal of < 100 mg/dL)      Medication List       This list is accurate as of: 07/04/15 12:39 PM.  Always use your most recent med list.               aspirin 81 MG tablet  Take 81 mg by mouth QID. PT STATES SHE TAKES 3-4 ASA QD     B-D UF III MINI PEN NEEDLES 31G X 5 MM Misc  Generic drug:  Insulin Pen Needle  USE 5 TIMES A DAY AS DIRECTED     ezetimibe-simvastatin 10-40 MG tablet  Commonly known as:  VYTORIN  Take 1 tablet by mouth daily.     glucose blood test strip  Commonly known as:  ONE TOUCH ULTRA TEST  Use as instructed to check blood sugars 3 times per day dx code E11.65     HUMALOG KWIKPEN 100 UNIT/ML KiwkPen  Generic drug:  insulin lispro  inject 6 units BEFORE BREAKFAST AND LUNCH AND 8 UNITS BEFORE SUPPER     Insulin Glargine 100 UNIT/ML Solostar Pen  Commonly known as:  LANTUS SOLOSTAR  Inject 24-26 Units into the skin daily at 10 pm.     levothyroxine 100 MCG tablet   Commonly known as:  SYNTHROID, LEVOTHROID  take 1 tablet by mouth once daily     Liraglutide 18 MG/3ML Sopn  Commonly known as:  VICTOZA  Inject 0.3 mLs (1.8 mg total) into the skin daily.     losartan 100 MG tablet  Commonly known as:  COZAAR  Take 1 tablet (100 mg total) by mouth daily.     nitroGLYCERIN 0.4 MG SL tablet  Commonly known as:  NITROSTAT  Place 1 tablet (0.4 mg total) under the tongue every 5 (five) minutes as needed for chest pain.     omega-3 acid ethyl esters 1 G capsule  Commonly known as:  LOVAZA  Take 2 g by mouth 2 (two) times daily.        Allergies:  Allergies  Allergen Reactions  . Actos [Pioglitazone]     hairloss  . Metformin And Related Diarrhea  . Novocain [Procaine]     syncopy  . Sulfa Antibiotics Itching    Past Medical History  Diagnosis Date  . Diabetes mellitus without complication ([place])   . Dyslipidemia   . Vertigo   . Complication of anesthesia     takes along time to wake up  . Arthritis   . Contact lens/glasses fitting     wears glasses or contacts  . Snores   . Hypothyroidism   . Coronary artery disease 01/2007    s/p PCI of diagonal   . Hyperlipidemia   . Hypertension     Past Surgical History  Procedure Laterality Date  . Cardiac catheterization  2008    stent  . Shoulder arthroscopy with open rotator cuff repair Right 02/28/2013    Procedure: right shoulder arthroscopy, distal clavicle sculpting, open rotator cuff repair;  Surgeon: Cammie Sickle., MD;  Location: [facility];  Service: Orthopedics;  Laterality: Right;    Family History  Problem Relation Age of Onset  . Hypertension Father   . Heart attack Father     Social History:  reports that she has never smoked. She does not have any smokeless tobacco history on file. She reports that she drinks alcohol. She reports that she does not use illicit drugs.  Review of Systems:  HYPERTENSION:  followed by PCP/cardiologist, blood pressure  controlled Home BP checked periodically and  using wrist monitor  HYPERLIPIDEMIA: The lipid abnormality consists of elevated LDL, taking Vytorin, followed by cardiologist.   She was told by cardiologist not to change this even though her LDL is well below her target of 100, she concerned about the expense  Lab Results  Component Value Date   CHOL 112 06/28/2015   HDL 40.60 06/28/2015   LDLCALC 51 06/28/2015   LDLDIRECT 121.2 05/08/2013   TRIG 104.0 06/28/2015   CHOLHDL 3 06/28/2015    History of mild hypothyroidism, previously treated by PCP  She has required progressively higher doses of  levothyroxine.  She is now taking 100 g daily since 11/2014 and does feel less tired   Lab Results  Component Value Date   TSH 2.17 03/05/2015   TSH 5.18* 12/03/2014   TSH 5.90* 08/21/2014        Examination:   BP 112/80 mmHg  Pulse 62  Temp(Src) 98.2 F (36.8 C)  Resp 14  Ht 5' 5.5" (1.664 m)  Wt 188 lb 3.2 oz (85.367 kg)  BMI 30.83 kg/m2  SpO2 97%  Body mass index is 30.83 kg/(m^2).     ASSESSMENT/ PLAN:   Diabetes type 2   The patient's diabetes control appears to be fairly good although A1c slightly higher than before at 7.3 See history of present illness for detailed discussion of his current management, blood sugar patterns and problems identified She has gained weight and is not as consistent with diet as before Blood sugar mildly increased after supper and overnight also  Since she is able to afford the Victoza now she was told to increase the dose to 1.8 and not increase the Lantus She probably does need at least 2 units more of the Humalog before most meals in the evenings  HYPERLIPIDEMIA: She should have adequate control of her lipids with Crestor and this will be less expensive than Vytorin, she will discuss again with cardiologist   Patient Instructions  Victoza 1.8 mg daily  Humalog 8-12, take 5-10 min before meals   Check blood sugars on waking up 3-4   times a week Also check blood sugars about 2 hours after a meal and do this after different meals by rotation  Recommended blood sugar levels on waking up is 90-130 and about 2 hours after meal is 130-160  Please bring your blood sugar monitor to each visit, thank you .    Barnett Elzey 07/04/2015, 12:39 PM

## 2015-07-04 NOTE — Patient Instructions (Signed)
Victoza 1.8 mg daily  Humalog 8-12, take 5-10 min before meals   Check blood sugars on waking up 3-4  times a week Also check blood sugars about 2 hours after a meal and do this after different meals by rotation  Recommended blood sugar levels on waking up is 90-130 and about 2 hours after meal is 130-160  Please bring your blood sugar monitor to each visit, thank you .

## 2015-07-05 ENCOUNTER — Telehealth: Payer: Self-pay

## 2015-07-05 DIAGNOSIS — E785 Hyperlipidemia, unspecified: Secondary | ICD-10-CM

## 2015-07-05 NOTE — Telephone Encounter (Signed)
Patient called in stating that her endo. doctor, Dr. Dwyane Dee has requested that you change the patient's cholesterol medications to Crestor only.  Patient stated the others were too expensive in the donut hole and Crestor is now generic.

## 2015-07-05 NOTE — Telephone Encounter (Signed)
Patient st she is in the donut hole and cannot afford her medications. She spoke with Dr. Dwyane Dee yesterday, who told her to consult with Dr. Radford Pax about Crestor since it is generic.  The patient wishes to stop all other cholesterol drugs and continue Crestor only. She st Dr. Dwyane Dee would recheck lab work in February. Informed patient Dr. Radford Pax is not here today, but she will be called next week with her recommendations.  To Dr. Radford Pax and Gay Filler, [facility].

## 2015-07-05 NOTE — Telephone Encounter (Signed)
Left message to call back  

## 2015-07-05 NOTE — Telephone Encounter (Signed)
Ok to change to Crestor 40mg  daily and recheck FLP and ALT in 6 weeks

## 2015-07-08 ENCOUNTER — Encounter: Payer: Self-pay | Admitting: Cardiology

## 2015-07-08 NOTE — Telephone Encounter (Signed)
New message    Patient returning call back to speak with nurse

## 2015-07-08 NOTE — Telephone Encounter (Signed)
Returned patient's phone call.  Left message to call back.

## 2015-07-08 NOTE — Telephone Encounter (Signed)
Left message to call back  

## 2015-07-08 NOTE — Telephone Encounter (Signed)
This encounter was created in error - please disregard.

## 2015-07-10 ENCOUNTER — Other Ambulatory Visit: Payer: Self-pay | Admitting: *Deleted

## 2015-07-10 MED ORDER — ROSUVASTATIN CALCIUM 40 MG PO TABS: 40.0000 mg | ORAL_TABLET | Freq: Every day | ORAL | Status: DC

## 2015-07-10 NOTE — Telephone Encounter (Signed)
calling about medication changes, see phone notes.   Filled & added Crestor 40mg  PO QD to pt med list per Dr. Radford Pax.

## 2015-07-15 NOTE — Telephone Encounter (Signed)
Instructed patient to start CRESTOR 40 mg daily. FLP and ALT scheduled 12/15. Patient agrees with treatment plan.

## 2015-07-15 NOTE — Addendum Note (Signed)
Addended by: Harland German A on: 07/15/2015 01:26 PM   Modules accepted: Orders

## 2015-08-07 ENCOUNTER — Other Ambulatory Visit: Payer: Self-pay | Admitting: Cardiology

## 2015-08-07 MED ORDER — LOSARTAN POTASSIUM 100 MG PO TABS: 100.0000 mg | ORAL_TABLET | Freq: Every day | ORAL | Status: DC

## 2015-08-12 DIAGNOSIS — D225 Melanocytic nevi of trunk: Secondary | ICD-10-CM | POA: Diagnosis not present

## 2015-08-12 DIAGNOSIS — L820 Inflamed seborrheic keratosis: Secondary | ICD-10-CM | POA: Diagnosis not present

## 2015-08-12 DIAGNOSIS — L908 Other atrophic disorders of skin: Secondary | ICD-10-CM | POA: Diagnosis not present

## 2015-08-29 ENCOUNTER — Other Ambulatory Visit (INDEPENDENT_AMBULATORY_CARE_PROVIDER_SITE_OTHER): Payer: Medicare Other | Admitting: *Deleted

## 2015-08-29 ENCOUNTER — Encounter (INDEPENDENT_AMBULATORY_CARE_PROVIDER_SITE_OTHER): Payer: Self-pay

## 2015-08-29 DIAGNOSIS — E785 Hyperlipidemia, unspecified: Secondary | ICD-10-CM | POA: Diagnosis not present

## 2015-08-29 DIAGNOSIS — Z1231 Encounter for screening mammogram for malignant neoplasm of breast: Secondary | ICD-10-CM | POA: Diagnosis not present

## 2015-08-29 LAB — LIPID PANEL
CHOL/HDL RATIO: 2.3 ratio (ref <=5.0)
CHOLESTEROL: 91 mg/dL — AB (ref 125–200)
HDL: 39 mg/dL — AB (ref >=46)
LDL Cholesterol: 34 mg/dL (ref <130)
Triglycerides: 90 mg/dL (ref <150)
VLDL: 18 mg/dL (ref <30)

## 2015-08-29 LAB — ALT: ALT: 29 U/L (ref 6–29)

## 2015-08-29 NOTE — Addendum Note (Signed)
Addended by: Eulis Foster on: 08/29/2015 08:39 AM   Modules accepted: Orders

## 2015-08-30 ENCOUNTER — Telehealth: Payer: Self-pay

## 2015-08-30 ENCOUNTER — Other Ambulatory Visit: Payer: Self-pay | Admitting: Endocrinology

## 2015-08-30 DIAGNOSIS — E785 Hyperlipidemia, unspecified: Secondary | ICD-10-CM

## 2015-08-30 MED ORDER — EZETIMIBE-SIMVASTATIN 10-40 MG PO TABS: 1.0000 | ORAL_TABLET | Freq: Every day | ORAL | Status: DC

## 2015-08-30 NOTE — Telephone Encounter (Signed)
-----   Message from Sueanne Margarita, MD sent at 08/30/2015  8:40 AM EST ----- Other choice is to go back on prior cholesterol meds she was on before crestor

## 2015-08-30 NOTE — Telephone Encounter (Signed)
Informed patient of Dr. Theodosia Blender recommendations to resume Vytorin 10-40. FLP and ALT scheduled 10/25/15. Patient agrees with treatment plan.

## 2015-09-18 DIAGNOSIS — J209 Acute bronchitis, unspecified: Secondary | ICD-10-CM | POA: Diagnosis not present

## 2015-09-25 DIAGNOSIS — L820 Inflamed seborrheic keratosis: Secondary | ICD-10-CM | POA: Diagnosis not present

## 2015-10-03 DIAGNOSIS — M25562 Pain in left knee: Secondary | ICD-10-CM | POA: Diagnosis not present

## 2015-10-03 DIAGNOSIS — M25552 Pain in left hip: Secondary | ICD-10-CM | POA: Diagnosis not present

## 2015-10-03 DIAGNOSIS — M545 Low back pain: Secondary | ICD-10-CM | POA: Diagnosis not present

## 2015-10-09 DIAGNOSIS — M5416 Radiculopathy, lumbar region: Secondary | ICD-10-CM | POA: Diagnosis not present

## 2015-10-09 DIAGNOSIS — M545 Low back pain: Secondary | ICD-10-CM | POA: Diagnosis not present

## 2015-10-09 DIAGNOSIS — M5136 Other intervertebral disc degeneration, lumbar region: Secondary | ICD-10-CM | POA: Diagnosis not present

## 2015-10-11 DIAGNOSIS — M545 Low back pain: Secondary | ICD-10-CM | POA: Diagnosis not present

## 2015-10-11 DIAGNOSIS — M5136 Other intervertebral disc degeneration, lumbar region: Secondary | ICD-10-CM | POA: Diagnosis not present

## 2015-10-11 DIAGNOSIS — M5416 Radiculopathy, lumbar region: Secondary | ICD-10-CM | POA: Diagnosis not present

## 2015-10-15 DIAGNOSIS — M5136 Other intervertebral disc degeneration, lumbar region: Secondary | ICD-10-CM | POA: Diagnosis not present

## 2015-10-15 DIAGNOSIS — M5416 Radiculopathy, lumbar region: Secondary | ICD-10-CM | POA: Diagnosis not present

## 2015-10-15 DIAGNOSIS — M545 Low back pain: Secondary | ICD-10-CM | POA: Diagnosis not present

## 2015-10-25 ENCOUNTER — Other Ambulatory Visit (INDEPENDENT_AMBULATORY_CARE_PROVIDER_SITE_OTHER): Payer: Medicare Other | Admitting: *Deleted

## 2015-10-25 DIAGNOSIS — E785 Hyperlipidemia, unspecified: Secondary | ICD-10-CM

## 2015-10-25 LAB — LIPID PANEL
CHOL/HDL RATIO: 3.6 ratio (ref <=5.0)
CHOLESTEROL: 136 mg/dL (ref 125–200)
HDL: 38 mg/dL — AB (ref >=46)
LDL Cholesterol: 77 mg/dL (ref <130)
TRIGLYCERIDES: 107 mg/dL (ref <150)
VLDL: 21 mg/dL (ref <30)

## 2015-10-25 LAB — ALT: ALT: 31 U/L — AB (ref 6–29)

## 2015-10-26 ENCOUNTER — Other Ambulatory Visit: Payer: Self-pay | Admitting: Endocrinology

## 2015-10-28 ENCOUNTER — Telehealth: Payer: Self-pay

## 2015-10-28 DIAGNOSIS — E785 Hyperlipidemia, unspecified: Secondary | ICD-10-CM

## 2015-10-28 NOTE — Telephone Encounter (Signed)
-----   Message from Sueanne Margarita, MD sent at 10/25/2015  6:54 PM EST ----- LDL not at goal - please have patient watch carbs and follow low fat diet and recheck in 4 months

## 2015-10-28 NOTE — Telephone Encounter (Signed)
Informed patient of results and verbal understanding expressed.   Instructed patient to watch carbs and follow low fat diet. Orders and recall placed to have labs checked in 4 months. Patient agrees with treatment plan.

## 2015-10-30 ENCOUNTER — Other Ambulatory Visit (INDEPENDENT_AMBULATORY_CARE_PROVIDER_SITE_OTHER): Payer: Medicare Other

## 2015-10-30 DIAGNOSIS — Z794 Long term (current) use of insulin: Secondary | ICD-10-CM

## 2015-10-30 DIAGNOSIS — E1165 Type 2 diabetes mellitus with hyperglycemia: Secondary | ICD-10-CM | POA: Diagnosis not present

## 2015-10-30 LAB — BASIC METABOLIC PANEL
BUN: 18 mg/dL (ref 6–23)
CHLORIDE: 107 meq/L (ref 96–112)
CO2: 27 mEq/L (ref 19–32)
CREATININE: 1.03 mg/dL (ref 0.40–1.20)
Calcium: 9.7 mg/dL (ref 8.4–10.5)
GFR: 56.81 mL/min — ABNORMAL LOW (ref >60.00)
Glucose, Bld: 135 mg/dL — ABNORMAL HIGH (ref 70–99)
POTASSIUM: 3.8 meq/L (ref 3.5–5.1)
SODIUM: 141 meq/L (ref 135–145)

## 2015-10-30 LAB — HEMOGLOBIN A1C: HEMOGLOBIN A1C: 7.5 % — AB (ref 4.6–6.5)

## 2015-10-30 LAB — TSH: TSH: 3.18 u[IU]/mL (ref 0.35–4.50)

## 2015-10-31 LAB — MICROALBUMIN / CREATININE URINE RATIO
Creatinine,U: 62.2 mg/dL
MICROALB UR: 14.4 mg/dL — AB (ref 0.0–1.9)
MICROALB/CREAT RATIO: 23.1 mg/g (ref 0.0–30.0)

## 2015-11-04 ENCOUNTER — Ambulatory Visit (INDEPENDENT_AMBULATORY_CARE_PROVIDER_SITE_OTHER): Payer: Medicare Other | Admitting: Endocrinology

## 2015-11-04 ENCOUNTER — Encounter: Payer: Self-pay | Admitting: Endocrinology

## 2015-11-04 VITALS — BP 138/78 | HR 74 | Temp 97.9°F | Resp 16 | Ht 65.5 in | Wt 191.0 lb

## 2015-11-04 DIAGNOSIS — Z794 Long term (current) use of insulin: Secondary | ICD-10-CM | POA: Diagnosis not present

## 2015-11-04 DIAGNOSIS — E063 Autoimmune thyroiditis: Secondary | ICD-10-CM

## 2015-11-04 DIAGNOSIS — E1165 Type 2 diabetes mellitus with hyperglycemia: Secondary | ICD-10-CM

## 2015-11-04 DIAGNOSIS — E785 Hyperlipidemia, unspecified: Secondary | ICD-10-CM

## 2015-11-04 DIAGNOSIS — E038 Other specified hypothyroidism: Secondary | ICD-10-CM | POA: Diagnosis not present

## 2015-11-04 NOTE — Patient Instructions (Addendum)
Lantus 28 for now  Check blood sugars on waking up 3-4  times a week Also check blood sugars about 2 hours after a meal and do this after different meals by rotation  Recommended blood sugar levels on waking up is 90-130 and about 2 hours after meal is 130-160  Please bring your blood sugar monitor to each visit, thank you  Check cost of Zetia

## 2015-11-04 NOTE — Progress Notes (Signed)
Patient ID: Crystal Hobbs, female   DOB: [birth year], 65 y.o.   MRN: [id number]   Reason for Appointment: Diabetes follow-up   History of Present Illness   Diagnosis: Type 2 DIABETES MELITUS, date of diagnosis:  2011   Past history: Her initial presentation with diabetes was with significant symptoms and glucose of 947 Initially was treated with insulin and subsequently on metformin which she could not tolerate  Since she was not responding to Victoza and had significant hyperglycemia she was started on basal bolus insulin regimen instead  However had required relatively large doses of insulin with inadequate control and blood sugars subsequently improved significantly with adding Victoza in 10/13  Her  A1c was 6.4 in 4/14 and she had overall good control with minimal hypoglycemia She was taken off  Afrezza because of her high out-of-pocket expense and she was tried on V-go pump which she had some difficulties getting this through the pharmacy  RECENT history:    Insulin regimen: Lantus 26 units Humalog 6-8 before dinner usually   She is on a regimen of basal bolus insulin regimen along with Victoza Her A1c is slightly higher at 7.5  She thinks that  higher readings are related to having respiratory infections last month and also lack of exercise consistently because of back pain  She takes 1.8 mg Victoza now since her last visit She does have an old monitor which cannot be downloaded today  Current blood sugar patterns and problems identified:  Her fasting blood sugars are recently mildly increased, she thinks they were better in between when she was having intercurrent medical issues  She has no readings after supper recently  Today she took Humalog for breakfast also: Normally she does not take it if blood sugar is normal  She is usually again is not checking blood sugars after breakfast or lunch She is compliant with her Victoza without any difficulties  Side effects  from medications:  diarrhea from metformin,? Hair loss from Actos Proper timing of medications in relation to meals: Yes         Monitors blood glucose: a day.    Glucometer: One Touch.          Blood Glucose readings from monitor Review  Thirty-day AVERAGE 141 Recent fasting range 103-171, highest reading 296 yesterday at noon  Hypoglycemia: None recently      Meals: 3 meals per day.  Breakfast: may have half a peanut better sandwich.   Dinner at 6-8 pm        Physical activity: exercise: Irregular, doing some walking and is able to this usually on level surface.  Starting to play tennis this week            Diabetes education visit: Most recent: [id number]           Complications: are: None     Wt Readings from Last 3 Encounters:  11/04/15 191 lb (86.637 kg)  07/04/15 188 lb 3.2 oz (85.367 kg)  03/08/15 183 lb 6.4 oz (83.19 kg)    Lab Results  Component Value Date   HGBA1C 7.5* 10/30/2015   HGBA1C 7.3* 06/28/2015   HGBA1C 7.0* 03/05/2015   Lab Results  Component Value Date   MICROALBUR 14.4* 10/30/2015   LDLCALC 77 10/25/2015   CREATININE 1.03 10/30/2015    Appointment on 10/30/2015  Component Date Value Ref Range Status  . Hgb A1c MFr Bld 10/30/2015 7.5* 4.6 - 6.5 % Final   Glycemic Control Guidelines  for People with Diabetes:Non Diabetic:  <6%Goal of Therapy: <7%Additional Action Suggested:  >8%   . Sodium 10/30/2015 141  135 - 145 mEq/L Final  . Potassium 10/30/2015 3.8  3.5 - 5.1 mEq/L Final  . Chloride 10/30/2015 107  96 - 112 mEq/L Final  . CO2 10/30/2015 27  19 - 32 mEq/L Final  . Glucose, Bld 10/30/2015 135* 70 - 99 mg/dL Final  . BUN 10/30/2015 18  6 - 23 mg/dL Final  . Creatinine, Ser 10/30/2015 1.03  0.40 - 1.20 mg/dL Final  . Calcium 10/30/2015 9.7  8.4 - 10.5 mg/dL Final  . GFR 10/30/2015 56.81* >60.00 mL/min Final  . TSH 10/30/2015 3.18  0.35 - 4.50 uIU/mL Final  . Microalb, Ur 10/30/2015 14.4* 0.0 - 1.9 mg/dL Final  . Creatinine,U 10/30/2015 62.2   Final   . Microalb Creat Ratio 10/30/2015 23.1  0.0 - 30.0 mg/g Final      Medication List       This list is accurate as of: 11/04/15 12:07 PM.  Always use your most recent med list.               acetaminophen 325 MG tablet  Commonly known as:  TYLENOL  Take 650 mg by mouth every 6 (six) hours as needed.     aspirin 81 MG tablet  Take 81 mg by mouth QID. PT STATES SHE TAKES 3-4 ASA QD     B-D UF III MINI PEN NEEDLES 31G X 5 MM Misc  Generic drug:  Insulin Pen Needle  USE 5 TIMES A DAY AS DIRECTED     ezetimibe-simvastatin 10-40 MG tablet  Commonly known as:  VYTORIN  Take 1 tablet by mouth daily.     glucose blood test strip  Commonly known as:  ONE TOUCH ULTRA TEST  Use as instructed to check blood sugars 3 times per day dx code E11.65     HUMALOG KWIKPEN 100 UNIT/ML KiwkPen  Generic drug:  insulin lispro  inject 6 units BEFORE BREAKFAST AND LUNCH AND 8 UNITS BEFORE SUPPER     LANTUS SOLOSTAR 100 UNIT/ML Solostar Pen  Generic drug:  Insulin Glargine  INJECT 24 TO 26 UNITS SUBCUTANEOUSLY DAILY AT 10 PM     levothyroxine 100 MCG tablet  Commonly known as:  SYNTHROID, LEVOTHROID  take 1 tablet by mouth once daily     losartan 100 MG tablet  Commonly known as:  COZAAR  Take 1 tablet (100 mg total) by mouth daily.     nitroGLYCERIN 0.4 MG SL tablet  Commonly known as:  NITROSTAT  Place 1 tablet (0.4 mg total) under the tongue every 5 (five) minutes as needed for chest pain.     omega-3 acid ethyl esters 1 g capsule  Commonly known as:  LOVAZA  Take 2 g by mouth 2 (two) times daily.     VICTOZA 18 MG/3ML Sopn  Generic drug:  Liraglutide  INJECT 0.3 MILLILITERS SUBCUTANEOUSLY DAILY        Allergies:  Allergies  Allergen Reactions  . Actos [Pioglitazone]     hairloss  . Metformin And Related Diarrhea  . Novocain [Procaine]     syncopy  . Sulfa Antibiotics Itching    Past Medical History  Diagnosis Date  . Diabetes mellitus without complication ([place])     . Dyslipidemia   . Vertigo   . Complication of anesthesia     takes along time to wake up  . Arthritis   . Contact  lens/glasses fitting     wears glasses or contacts  . Snores   . Hypothyroidism   . Coronary artery disease 01/2007    s/p PCI of diagonal   . Hyperlipidemia   . Hypertension     Past Surgical History  Procedure Laterality Date  . Cardiac catheterization  2008    stent  . Shoulder arthroscopy with open rotator cuff repair Right 02/28/2013    Procedure: right shoulder arthroscopy, distal clavicle sculpting, open rotator cuff repair;  Surgeon: Cammie Sickle., MD;  Location: [facility];  Service: Orthopedics;  Laterality: Right;    Family History  Problem Relation Age of Onset  . Hypertension Father   . Heart attack Father     Social History:  reports that she has never smoked. She does not have any smokeless tobacco history on file. She reports that she drinks alcohol. She reports that she does not use illicit drugs.  Review of Systems:  HYPERTENSION:  followed by PCP/cardiologist, blood pressure controlled  HYPERLIPIDEMIA: The lipid abnormality consists of elevated LDL, taking Vytorin, followed by cardiologist.   She was tried on Crestor but she thinks this caused leg pain and is back on Vytorin she is still concerned about the cost   Lab Results  Component Value Date   CHOL 136 10/25/2015   HDL 38* 10/25/2015   LDLCALC 77 10/25/2015   LDLDIRECT 121.2 05/08/2013   TRIG 107 10/25/2015   CHOLHDL 3.6 10/25/2015    History of mild hypothyroidism, previously treated by PCP  She has previously required progressively higher doses of levothyroxine.  She is  taking 100 g daily since 11/2014 and does feel fairly good with her energy level   Lab Results  Component Value Date   TSH 3.18 10/30/2015   TSH 2.17 03/05/2015   TSH 5.18* 12/03/2014        Examination:   BP 138/78 mmHg  Pulse 74  Temp(Src) 97.9 F (36.6 C)  Resp 16  Ht  5' 5.5" (1.664 m)  Wt 191 lb (86.637 kg)  BMI 31.29 kg/m2  SpO2 93%  Body mass index is 31.29 kg/(m^2).     ASSESSMENT/ PLAN:   Diabetes type 2   The patient's diabetes control appears to be overall worse with A1c 7.5 and periodically higher readings See history of present illness for detailed discussion of his current management, blood sugar patterns and problems identified Most likely has post prandial hyper glycemia after her main meal which is dinner and she does not monitor much despite repeated discussions Not clear how often she is getting high readings after breakfast and lunch also  She has gained weight and is not as consistent with diet and exercise lately Sugars are not consistently even with increasing her Victoza to 1.8  Since she is able to afford the Victoza now she was told to increase the dose to 1.8 and not increase the Lantus She probably does need at least 2 units more of the Humalog before most meals in the evenings However discussed need to check consistently and adjust the dose even for meals in the morning  She will also increase her Lantus by 2 units now until fasting readings are close to 100 or below Consider continuous glucose monitoring if A1c is still high on the next visit  HYPERLIPIDEMIA: She will continue Vytorin and she can check on the cost of Zetia and simvastatin separately   Patient Instructions  Lantus 28 for now  Check blood  sugars on waking up 3-4  times a week Also check blood sugars about 2 hours after a meal and do this after different meals by rotation  Recommended blood sugar levels on waking up is 90-130 and about 2 hours after meal is 130-160  Please bring your blood sugar monitor to each visit, thank you  Check cost of Zetia    Counseling time on subjects discussed above is over 50% of today's 25 minute visit   Crystal Hobbs 11/04/2015, 12:07 PM

## 2015-11-07 DIAGNOSIS — M5136 Other intervertebral disc degeneration, lumbar region: Secondary | ICD-10-CM | POA: Diagnosis not present

## 2015-11-12 DIAGNOSIS — M545 Low back pain: Secondary | ICD-10-CM | POA: Diagnosis not present

## 2015-11-19 DIAGNOSIS — M545 Low back pain: Secondary | ICD-10-CM | POA: Diagnosis not present

## 2015-11-19 DIAGNOSIS — M7062 Trochanteric bursitis, left hip: Secondary | ICD-10-CM | POA: Diagnosis not present

## 2015-11-19 DIAGNOSIS — M5416 Radiculopathy, lumbar region: Secondary | ICD-10-CM | POA: Diagnosis not present

## 2015-11-19 DIAGNOSIS — M5136 Other intervertebral disc degeneration, lumbar region: Secondary | ICD-10-CM | POA: Diagnosis not present

## 2015-11-22 DIAGNOSIS — M5136 Other intervertebral disc degeneration, lumbar region: Secondary | ICD-10-CM | POA: Diagnosis not present

## 2015-11-22 DIAGNOSIS — M5416 Radiculopathy, lumbar region: Secondary | ICD-10-CM | POA: Diagnosis not present

## 2015-11-22 DIAGNOSIS — M545 Low back pain: Secondary | ICD-10-CM | POA: Diagnosis not present

## 2015-11-27 ENCOUNTER — Other Ambulatory Visit: Payer: Self-pay | Admitting: Family Medicine

## 2015-11-27 ENCOUNTER — Other Ambulatory Visit (HOSPITAL_COMMUNITY)
Admission: RE | Admit: 2015-11-27 | Discharge: 2015-11-27 | Disposition: A | Discharge status: Home / Self Care | Payer: Medicare Other | Source: Ambulatory Visit | Attending: Family Medicine | Admitting: Family Medicine

## 2015-11-27 DIAGNOSIS — Z13820 Encounter for screening for osteoporosis: Secondary | ICD-10-CM | POA: Diagnosis not present

## 2015-11-27 DIAGNOSIS — I10 Essential (primary) hypertension: Secondary | ICD-10-CM | POA: Diagnosis not present

## 2015-11-27 DIAGNOSIS — Z124 Encounter for screening for malignant neoplasm of cervix: Secondary | ICD-10-CM | POA: Diagnosis not present

## 2015-11-27 DIAGNOSIS — E7800 Pure hypercholesterolemia, unspecified: Secondary | ICD-10-CM | POA: Diagnosis not present

## 2015-11-27 DIAGNOSIS — Z1389 Encounter for screening for other disorder: Secondary | ICD-10-CM | POA: Diagnosis not present

## 2015-11-27 DIAGNOSIS — E1165 Type 2 diabetes mellitus with hyperglycemia: Secondary | ICD-10-CM | POA: Diagnosis not present

## 2015-11-27 DIAGNOSIS — E039 Hypothyroidism, unspecified: Secondary | ICD-10-CM | POA: Diagnosis not present

## 2015-11-27 DIAGNOSIS — Z23 Encounter for immunization: Secondary | ICD-10-CM | POA: Diagnosis not present

## 2015-11-27 DIAGNOSIS — Z794 Long term (current) use of insulin: Secondary | ICD-10-CM | POA: Diagnosis not present

## 2015-11-27 DIAGNOSIS — Z0000 Encounter for general adult medical examination without abnormal findings: Secondary | ICD-10-CM | POA: Diagnosis not present

## 2015-11-27 DIAGNOSIS — I2510 Atherosclerotic heart disease of native coronary artery without angina pectoris: Secondary | ICD-10-CM | POA: Diagnosis not present

## 2015-11-29 LAB — CYTOLOGY - PAP

## 2015-12-01 ENCOUNTER — Other Ambulatory Visit: Payer: Self-pay | Admitting: Cardiology

## 2015-12-05 DIAGNOSIS — M5416 Radiculopathy, lumbar region: Secondary | ICD-10-CM | POA: Diagnosis not present

## 2015-12-05 DIAGNOSIS — M5136 Other intervertebral disc degeneration, lumbar region: Secondary | ICD-10-CM | POA: Diagnosis not present

## 2015-12-05 DIAGNOSIS — M545 Low back pain: Secondary | ICD-10-CM | POA: Diagnosis not present

## 2015-12-08 ENCOUNTER — Other Ambulatory Visit: Payer: Self-pay | Admitting: Endocrinology

## 2015-12-10 DIAGNOSIS — M79605 Pain in left leg: Secondary | ICD-10-CM | POA: Diagnosis not present

## 2015-12-10 DIAGNOSIS — M5416 Radiculopathy, lumbar region: Secondary | ICD-10-CM | POA: Diagnosis not present

## 2015-12-10 DIAGNOSIS — M545 Low back pain: Secondary | ICD-10-CM | POA: Diagnosis not present

## 2015-12-10 DIAGNOSIS — M47896 Other spondylosis, lumbar region: Secondary | ICD-10-CM | POA: Diagnosis not present

## 2015-12-17 DIAGNOSIS — M545 Low back pain: Secondary | ICD-10-CM | POA: Diagnosis not present

## 2015-12-17 DIAGNOSIS — M47896 Other spondylosis, lumbar region: Secondary | ICD-10-CM | POA: Diagnosis not present

## 2015-12-17 DIAGNOSIS — M5416 Radiculopathy, lumbar region: Secondary | ICD-10-CM | POA: Diagnosis not present

## 2015-12-17 DIAGNOSIS — M79605 Pain in left leg: Secondary | ICD-10-CM | POA: Diagnosis not present

## 2015-12-20 DIAGNOSIS — M79605 Pain in left leg: Secondary | ICD-10-CM | POA: Diagnosis not present

## 2015-12-20 DIAGNOSIS — M5416 Radiculopathy, lumbar region: Secondary | ICD-10-CM | POA: Diagnosis not present

## 2015-12-20 DIAGNOSIS — M545 Low back pain: Secondary | ICD-10-CM | POA: Diagnosis not present

## 2015-12-20 DIAGNOSIS — M47896 Other spondylosis, lumbar region: Secondary | ICD-10-CM | POA: Diagnosis not present

## 2015-12-24 DIAGNOSIS — M5416 Radiculopathy, lumbar region: Secondary | ICD-10-CM | POA: Diagnosis not present

## 2015-12-24 DIAGNOSIS — M545 Low back pain: Secondary | ICD-10-CM | POA: Diagnosis not present

## 2015-12-24 DIAGNOSIS — M47896 Other spondylosis, lumbar region: Secondary | ICD-10-CM | POA: Diagnosis not present

## 2015-12-24 DIAGNOSIS — M79605 Pain in left leg: Secondary | ICD-10-CM | POA: Diagnosis not present

## 2015-12-31 DIAGNOSIS — M47896 Other spondylosis, lumbar region: Secondary | ICD-10-CM | POA: Diagnosis not present

## 2015-12-31 DIAGNOSIS — M5416 Radiculopathy, lumbar region: Secondary | ICD-10-CM | POA: Diagnosis not present

## 2015-12-31 DIAGNOSIS — M79605 Pain in left leg: Secondary | ICD-10-CM | POA: Diagnosis not present

## 2015-12-31 DIAGNOSIS — M545 Low back pain: Secondary | ICD-10-CM | POA: Diagnosis not present

## 2015-12-31 DIAGNOSIS — Z780 Asymptomatic menopausal state: Secondary | ICD-10-CM | POA: Diagnosis not present

## 2016-01-01 DIAGNOSIS — Z23 Encounter for immunization: Secondary | ICD-10-CM | POA: Diagnosis not present

## 2016-01-23 ENCOUNTER — Other Ambulatory Visit: Payer: Self-pay | Admitting: Endocrinology

## 2016-02-06 ENCOUNTER — Other Ambulatory Visit: Payer: Medicare Other

## 2016-02-07 ENCOUNTER — Other Ambulatory Visit: Payer: Self-pay | Admitting: Endocrinology

## 2016-02-12 ENCOUNTER — Ambulatory Visit: Payer: Medicare Other | Admitting: Endocrinology

## 2016-03-05 ENCOUNTER — Other Ambulatory Visit: Payer: Medicare Other

## 2016-03-10 ENCOUNTER — Ambulatory Visit: Payer: Medicare Other | Admitting: Endocrinology

## 2016-03-10 DIAGNOSIS — Z0289 Encounter for other administrative examinations: Secondary | ICD-10-CM

## 2016-03-31 ENCOUNTER — Other Ambulatory Visit (INDEPENDENT_AMBULATORY_CARE_PROVIDER_SITE_OTHER): Payer: Medicare Other

## 2016-03-31 DIAGNOSIS — E1165 Type 2 diabetes mellitus with hyperglycemia: Secondary | ICD-10-CM | POA: Diagnosis not present

## 2016-03-31 DIAGNOSIS — Z794 Long term (current) use of insulin: Secondary | ICD-10-CM

## 2016-03-31 LAB — BASIC METABOLIC PANEL
BUN: 22 mg/dL (ref 6–23)
CHLORIDE: 109 meq/L (ref 96–112)
CO2: 26 meq/L (ref 19–32)
CREATININE: 1 mg/dL (ref 0.40–1.20)
Calcium: 9.5 mg/dL (ref 8.4–10.5)
GFR: 58.71 mL/min — ABNORMAL LOW (ref >60.00)
GLUCOSE: 118 mg/dL — AB (ref 70–99)
Potassium: 4.3 mEq/L (ref 3.5–5.1)
Sodium: 140 mEq/L (ref 135–145)

## 2016-03-31 LAB — HEMOGLOBIN A1C: HEMOGLOBIN A1C: 7.5 % — AB (ref 4.6–6.5)

## 2016-04-03 ENCOUNTER — Ambulatory Visit (INDEPENDENT_AMBULATORY_CARE_PROVIDER_SITE_OTHER): Payer: Medicare Other | Admitting: Endocrinology

## 2016-04-03 ENCOUNTER — Ambulatory Visit: Payer: Medicare Other | Admitting: Endocrinology

## 2016-04-03 ENCOUNTER — Encounter: Payer: Self-pay | Admitting: Endocrinology

## 2016-04-03 VITALS — BP 130/70 | HR 95 | Ht 65.0 in | Wt 189.0 lb

## 2016-04-03 DIAGNOSIS — Z794 Long term (current) use of insulin: Secondary | ICD-10-CM

## 2016-04-03 DIAGNOSIS — E1165 Type 2 diabetes mellitus with hyperglycemia: Secondary | ICD-10-CM | POA: Diagnosis not present

## 2016-04-03 NOTE — Patient Instructions (Addendum)
Lantus 33-34 units at dinner  Check blood sugars on waking up  3-4 times a week Also check blood sugars about 2 hours after a meal and do this after different meals by rotation  Recommended blood sugar levels on waking up is 90-130 and about 2 hours after meal is 130-160  Please bring your blood sugar monitor to each visit, thank you

## 2016-04-03 NOTE — Progress Notes (Signed)
Patient ID: Crystal Hobbs, female   DOB: May 31, 1951, 64 y.o.   MRN: [id number]   Reason for Appointment: Diabetes follow-up   History of Present Illness   Diagnosis: Type 2 DIABETES MELITUS, date of diagnosis:  2011   Past history: Her initial presentation with diabetes was with significant symptoms and glucose of 947 Initially was treated with insulin and subsequently on metformin which she could not tolerate  Since she was not responding to Victoza and had significant hyperglycemia she was started on basal bolus insulin regimen instead  However had required relatively large doses of insulin with inadequate control and blood sugars subsequently improved significantly with adding Victoza in 10/13  Her  A1c was 6.4 in 4/14 and she had overall good control with minimal hypoglycemia She was taken off  Afrezza because of her high out-of-pocket expense and she was tried on V-go pump which she had some difficulties getting this through the pharmacy  RECENT history:    Insulin regimen: Lantus 30 units Humalog 10--0/10--12 units, 3 times a day   She is on a regimen of basal bolus insulin regimen She has not taking her Victoza for nearly 3 months because of the cost Her A1c is still higher at 7.5  Current blood sugar patterns and problems identified:  Her fasting blood sugars are consistently high except once or twice  This is despite increasing her Lantus by 4 units since her last visit  Also frequently her blood sugars are high in the evenings after supper  Some of her high readings after evening meal are from her not taking her insulin with her when she is eating out.  She has no readings after breakfast and only 3 readings before supper which look fairly good  She is now taking Humalog at breakfast especially when her blood sugars are high  She has however lost couple of pounds even without Victoza with trying to be more active and getting back into exercise   Side effects  from medications:  diarrhea from metformin,? Hair loss from Actos Proper timing of medications in relation to meals: Yes         Monitors blood glucose: a day.    Glucometer: One Touch.          Blood Glucose readings from monitor Review  Mean values apply above for all meters except median for One Touch  PRE-MEAL Fasting Lunch Dinner Bedtime Overall  Glucose range: 123-204   128-148  115-245    Mean/median: 163    191  161    Hypoglycemia: None recently      Meals: 3 meals per day.  Breakfast: may have half a peanut better sandwich Or a granola bar.   Dinner at 6-8 pm        Physical activity: exercise: , doing some walking and is able to this usually on level surface.  Starting to play tennis              Diabetes education visit: Most recent: Q000111Q           Complications: are: None     Wt Readings from Last 3 Encounters:  04/03/16 189 lb (85.73 kg)  11/04/15 191 lb (86.637 kg)  07/04/15 188 lb 3.2 oz (85.367 kg)    Lab Results  Component Value Date   HGBA1C 7.5* 03/31/2016   HGBA1C 7.5* 10/30/2015   HGBA1C 7.3* 06/28/2015   Lab Results  Component Value Date   MICROALBUR 14.4* 10/30/2015  LDLCALC 77 10/25/2015   CREATININE 1.00 03/31/2016    Lab on 03/31/2016  Component Date Value Ref Range Status  . Hgb A1c MFr Bld 03/31/2016 7.5* 4.6 - 6.5 % Final   Glycemic Control Guidelines for People with Diabetes:Non Diabetic:  <6%Goal of Therapy: <7%Additional Action Suggested:  >8%   . Sodium 03/31/2016 140  135 - 145 mEq/L Final  . Potassium 03/31/2016 4.3  3.5 - 5.1 mEq/L Final  . Chloride 03/31/2016 109  96 - 112 mEq/L Final  . CO2 03/31/2016 26  19 - 32 mEq/L Final  . Glucose, Bld 03/31/2016 118* 70 - 99 mg/dL Final  . BUN 03/31/2016 22  6 - 23 mg/dL Final  . Creatinine, Ser 03/31/2016 1.00  0.40 - 1.20 mg/dL Final  . Calcium 03/31/2016 9.5  8.4 - 10.5 mg/dL Final  . GFR 03/31/2016 58.71* >60.00 mL/min Final      Medication List       This list is accurate  as of: 04/03/16 12:58 PM.  Always use your most recent med list.               acetaminophen 325 MG tablet  Commonly known as:  TYLENOL  Take 650 mg by mouth every 6 (six) hours as needed.     aspirin 81 MG tablet  Take 81 mg by mouth QID. PT STATES SHE TAKES 3-4 ASA QD     B-D UF III MINI PEN NEEDLES 31G X 5 MM Misc  Generic drug:  Insulin Pen Needle  USE 5 TIMES A DAY AS DIRECTED     glucose blood test strip  Commonly known as:  ONE TOUCH ULTRA TEST  Use as instructed to check blood sugars 3 times per day dx code E11.65     HUMALOG KWIKPEN 100 UNIT/ML KiwkPen  Generic drug:  insulin lispro  INJECT 6 UNITS BEFORE BREAKFAST AND LUNCH AND 8 UNITS BEFORE SUPPER     LANTUS SOLOSTAR 100 UNIT/ML Solostar Pen  Generic drug:  Insulin Glargine  INJECT 24 TO 26 UNITS SUBCUTANEOUSLY DAILY AT 10 PM     levothyroxine 100 MCG tablet  Commonly known as:  SYNTHROID, LEVOTHROID  take 1 tablet by mouth once daily     losartan 100 MG tablet  Commonly known as:  COZAAR  Take 1 tablet (100 mg total) by mouth daily.     nitroGLYCERIN 0.4 MG SL tablet  Commonly known as:  NITROSTAT  Place 1 tablet (0.4 mg total) under the tongue every 5 (five) minutes as needed for chest pain.     omega-3 acid ethyl esters 1 g capsule  Commonly known as:  LOVAZA  Take 2 g by mouth 2 (two) times daily.     VICTOZA 18 MG/3ML Sopn  Generic drug:  Liraglutide  INJECT 0.3 MILLILITERS SUBCUTANEOUSLY DAILY     VYTORIN 10-40 MG tablet  Generic drug:  ezetimibe-simvastatin  take 1 tablet by mouth once daily        Allergies:  Allergies  Allergen Reactions  . Actos [Pioglitazone]     hairloss  . Metformin And Related Diarrhea  . Novocain [Procaine]     syncopy  . Sulfa Antibiotics Itching    Past Medical History  Diagnosis Date  . Diabetes mellitus without complication ([place])   . Dyslipidemia   . Vertigo   . Complication of anesthesia     takes along time to wake up  . Arthritis   . Contact  lens/glasses fitting  wears glasses or contacts  . Snores   . Hypothyroidism   . Coronary artery disease 01/2007    s/p PCI of diagonal   . Hyperlipidemia   . Hypertension     Past Surgical History  Procedure Laterality Date  . Cardiac catheterization  2008    stent  . Shoulder arthroscopy with open rotator cuff repair Right 02/28/2013    Procedure: right shoulder arthroscopy, distal clavicle sculpting, open rotator cuff repair;  Surgeon: Cammie Sickle., MD;  Location: [facility];  Service: Orthopedics;  Laterality: Right;    Family History  Problem Relation Age of Onset  . Hypertension Father   . Heart attack Father     Social History:  reports that she has never smoked. She does not have any smokeless tobacco history on file. She reports that she drinks alcohol. She reports that she does not use illicit drugs.  Review of Systems:  HYPERTENSION:  followed by PCP/cardiologist, blood pressure controlled  HYPERLIPIDEMIA: The lipid abnormality consists of elevated LDL, taking Vytorin, followed by cardiologist.   She was tried on Crestor but she thinks this caused leg pain and is  on Vytorin; she is still concerned about the cost   Lab Results  Component Value Date   CHOL 136 10/25/2015   HDL 38* 10/25/2015   LDLCALC 77 10/25/2015   LDLDIRECT 121.2 05/08/2013   TRIG 107 10/25/2015   CHOLHDL 3.6 10/25/2015    History of mild hypothyroidism, previously treated by PCP  She has previously required progressively higher doses of levothyroxine.  She is  taking 100 g daily since 11/2014 and does feel fairly good   Lab Results  Component Value Date   TSH 3.18 10/30/2015   TSH 2.17 03/05/2015   TSH 5.18* 12/03/2014        Examination:   BP 130/70 mmHg  Pulse 95  Ht 5\' 5"  (1.651 m)  Wt 189 lb (85.73 kg)  BMI 31.45 kg/m2  SpO2 97%  Body mass index is 31.45 kg/(m^2).     ASSESSMENT/ PLAN:   Diabetes type 2 With BMI 31  See history of present  illness for detailed discussion of his current management, blood sugar patterns and problems identified Her blood sugar getting difficult to control with her not being being able to afford Victoza, now in the doughnut hole She has increased her Lantus which fasting readings are high Again blood sugars are high after meals especially when she is not taking her evening insulin before eating Also not clear if Lantus is lasting 24 hours  Discussed again the need to improve her insulin regimen and good blood sugars more optimally controlled Since she is complaining about the cost of Lantus she can try the V-go pump She is familiar with this other previously was not able to get this for practical reasons Again discussed advantages of the V-go pump  She will be instructed on the 30 units in V-go pump Most likely can take 6-8 units of boluses with meals to start with  Hypothyroidism: Will need follow-up TSH on the next visit  Patient Instructions  Lantus 33-34 units at dinner  Check blood sugars on waking up  3-4 times a week Also check blood sugars about 2 hours after a meal and do this after different meals by rotation  Recommended blood sugar levels on waking up is 90-130 and about 2 hours after meal is 130-160  Please bring your blood sugar monitor to each visit, thank you  Counseling time on subjects discussed above is over 50% of today's 25 minute visit   Sydnie Sigmund 04/03/2016, 12:58 PM

## 2016-04-08 ENCOUNTER — Encounter: Payer: Medicare Other | Attending: Endocrinology | Admitting: Nutrition

## 2016-04-08 DIAGNOSIS — Z794 Long term (current) use of insulin: Secondary | ICD-10-CM | POA: Insufficient documentation

## 2016-04-08 DIAGNOSIS — E1165 Type 2 diabetes mellitus with hyperglycemia: Secondary | ICD-10-CM | POA: Diagnosis not present

## 2016-04-08 DIAGNOSIS — Z713 Dietary counseling and surveillance: Secondary | ICD-10-CM | POA: Diagnosis not present

## 2016-04-08 NOTE — Patient Instructions (Signed)
Fill and apply a new v-Go every 24 hours,  Stop the Lantus insulin Give 2-3 button presses before each meal Test blood sugars before meals and at bedtime.   call the blood sugar readings to the office on Friday.

## 2016-04-08 NOTE — Assessment & Plan Note (Signed)
Pt. Was retrained on the V-Go 30 pump.  She did not take her Lantus last night.  She filled the V-Go with little assistance from me and applied it to her upper left breast area.  She did not want to use her abdomen, because it is hard for her to use it there, as well as her arm.   Written instructions were given to fill and apply a new v-Go every 24 hours,  Stop the Lantus insulin Give 2-3 button presses before each meal Test blood sugars before meals and at bedtime.  I gave her a sheet to record the resuts, and well as the number to call on Friday.   She had no final questions. She was given a V-Go 30 starter kit with directions for use, and a toll free number to call if questions,.   Paperwork was filled out to see if her insurance will cover this, and faxed in.

## 2016-04-13 ENCOUNTER — Ambulatory Visit (INDEPENDENT_AMBULATORY_CARE_PROVIDER_SITE_OTHER): Payer: Medicare Other | Admitting: Endocrinology

## 2016-04-13 VITALS — BP 132/70 | HR 69 | Ht 65.0 in | Wt 193.0 lb

## 2016-04-13 DIAGNOSIS — Z794 Long term (current) use of insulin: Secondary | ICD-10-CM

## 2016-04-13 DIAGNOSIS — E1165 Type 2 diabetes mellitus with hyperglycemia: Secondary | ICD-10-CM

## 2016-04-13 MED ORDER — LIRAGLUTIDE 18 MG/3ML ~~LOC~~ SOPN: PEN_INJECTOR | SUBCUTANEOUS | 1 refills | Status: DC

## 2016-04-13 NOTE — Patient Instructions (Signed)
5 clicks at dinner, may need 3 at lunch and Bfst  Victoza 0.6 for 3 days then 1.2 mg  Check blood sugars on waking up    Also check blood sugars about 2 hours after a meal and do this after different meals by rotation  Recommended blood sugar levels on waking up is 90-130 and about 2 hours after meal is 130-160  Please bring your blood sugar monitor to each visit, thank you

## 2016-04-13 NOTE — Progress Notes (Signed)
Patient ID: Crystal Hobbs, female   DOB: 01/27/1951, 65 y.o.   MRN: [id number]   Reason for Appointment: Diabetes follow-up   History of Present Illness   Diagnosis: Type 2 DIABETES MELITUS, date of diagnosis:  2011   Past history: Her initial presentation with diabetes was with significant symptoms and glucose of 947 Initially was treated with insulin and subsequently on metformin which she could not tolerate  Since she was not responding to Victoza and had significant hyperglycemia she was started on basal bolus insulin regimen instead  However had required relatively large doses of insulin with inadequate control and blood sugars subsequently improved significantly with adding Victoza in 10/13  Her  A1c was 6.4 in 4/14 and she had overall good control with minimal hypoglycemia She was taken off  Afrezza because of her high out-of-pocket expense and she was tried on V-go pump which she had some difficulties getting this through the pharmacy  RECENT history:    Insulin regimen: V-go 30 units Humalog boluses 8-8-8, 3 times a day   She is on a regimen of basal bolus insulin regimen, now starting the V-go pump as of 04/08/16 She has not been taking her Victoza for nearly 3 months because of the cost Her A1c is still higher at 7.5  Current blood sugar patterns and problems identified:  Her fasting blood sugars are consistently around 150-160 on the pump  She has checks some readings at lunchtime and they are fairly normal  Has a couple of readings at suppertime, once high   Blood sugars at bedtime/postprandial are consistently high down as below  She has no difficulty using the pump although she is trying to find the most convenient site for using it, does not like it on her leg  Side effects from medications:  diarrhea from metformin,? Hair loss from Actos Proper timing of medications in relation to meals: Yes         Monitors blood glucose: Recently 3 times  a day.     Glucometer: One Touch.          Blood Glucose readings from monitor   Mean values apply above for all meters except median for One Touch  PRE-MEAL Fasting Lunch Dinner Bedtime Overall  Glucose range: 153-168 106-135 114, 170 173-293   Mean/median:        Hypoglycemia: None recently      Meals: 3 meals per day.  Breakfast: may have half a peanut better sandwich Or a granola bar.   Dinner at 6-8 pm        Physical activity: exercise: , doing some walking and is able to this usually on level surface.  Now trying to play tennis              Diabetes education visit: Most recent: 11/8180           Complications: are: None     Wt Readings from Last 3 Encounters:  04/13/16 193 lb (87.5 kg)  04/03/16 189 lb (85.7 kg)  11/04/15 191 lb (86.6 kg)    Lab Results  Component Value Date   HGBA1C 7.5 (H) 03/31/2016   HGBA1C 7.5 (H) 10/30/2015   HGBA1C 7.3 (H) 06/28/2015   Lab Results  Component Value Date   MICROALBUR 14.4 (H) 10/30/2015   LDLCALC 77 10/25/2015   CREATININE 1.00 03/31/2016    No visits with results within 1 Week(s) from this visit.  Latest known visit with results is:  Lab  on 03/31/2016  Component Date Value Ref Range Status  . Hgb A1c MFr Bld 03/31/2016 7.5* 4.6 - 6.5 % Final  . Sodium 03/31/2016 140  135 - 145 mEq/L Final  . Potassium 03/31/2016 4.3  3.5 - 5.1 mEq/L Final  . Chloride 03/31/2016 109  96 - 112 mEq/L Final  . CO2 03/31/2016 26  19 - 32 mEq/L Final  . Glucose, Bld 03/31/2016 118* 70 - 99 mg/dL Final  . BUN 03/31/2016 22  6 - 23 mg/dL Final  . Creatinine, Ser 03/31/2016 1.00  0.40 - 1.20 mg/dL Final  . Calcium 03/31/2016 9.5  8.4 - 10.5 mg/dL Final  . GFR 03/31/2016 58.71* >60.00 mL/min Final      Medication List       Accurate as of 04/13/16 11:59 PM. Always use your most recent med list.          acetaminophen 325 MG tablet Commonly known as:  TYLENOL Take 650 mg by mouth every 6 (six) hours as needed.   aspirin 81 MG tablet Take 81 mg  by mouth QID. PT STATES SHE TAKES 3-4 ASA QD   B-D UF III MINI PEN NEEDLES 31G X 5 MM Misc Generic drug:  Insulin Pen Needle USE 5 TIMES A DAY AS DIRECTED   glucose blood test strip Commonly known as:  ONE TOUCH ULTRA TEST Use as instructed to check blood sugars 3 times per day dx code E11.65   insulin lispro 100 UNIT/ML injection Commonly known as:  HUMALOG Inject into the skin once. Per vgo pump   HUMALOG KWIKPEN 100 UNIT/ML KiwkPen Generic drug:  insulin lispro INJECT 6 UNITS BEFORE BREAKFAST AND LUNCH AND 8 UNITS BEFORE SUPPER   LANTUS SOLOSTAR 100 UNIT/ML Solostar Pen Generic drug:  Insulin Glargine INJECT 24 TO 26 UNITS SUBCUTANEOUSLY DAILY AT 10 PM   levothyroxine 100 MCG tablet Commonly known as:  SYNTHROID, LEVOTHROID take 1 tablet by mouth once daily   Liraglutide 18 MG/3ML Sopn Commonly known as:  VICTOZA INJECT 1.2 mg  SUBCUTANEOUSLY DAILY   losartan 100 MG tablet Commonly known as:  COZAAR Take 1 tablet (100 mg total) by mouth daily.   nitroGLYCERIN 0.4 MG SL tablet Commonly known as:  NITROSTAT Place 1 tablet (0.4 mg total) under the tongue every 5 (five) minutes as needed for chest pain.   omega-3 acid ethyl esters 1 g capsule Commonly known as:  LOVAZA Take 2 g by mouth 2 (two) times daily.   V-GO 30 Kit by Does not apply route.   VYTORIN 10-40 MG tablet Generic drug:  ezetimibe-simvastatin take 1 tablet by mouth once daily       Allergies:  Allergies  Allergen Reactions  . Actos [Pioglitazone]     hairloss  . Metformin And Related Diarrhea  . Novocain [Procaine]     syncopy  . Sulfa Antibiotics Itching    Past Medical History:  Diagnosis Date  . Arthritis   . Complication of anesthesia    takes along time to wake up  . Contact lens/glasses fitting    wears glasses or contacts  . Coronary artery disease 01/2007   s/p PCI of diagonal   . Diabetes mellitus without complication ([place])   . Dyslipidemia   . Hyperlipidemia   .  Hypertension   . Hypothyroidism   . Snores   . Vertigo     Past Surgical History:  Procedure Laterality Date  . CARDIAC CATHETERIZATION  2008   stent  . SHOULDER ARTHROSCOPY  WITH OPEN ROTATOR CUFF REPAIR Right 02/28/2013   Procedure: right shoulder arthroscopy, distal clavicle sculpting, open rotator cuff repair;  Surgeon: Cammie Sickle., MD;  Location: [place];  Service: Orthopedics;  Laterality: Right;    Family History  Problem Relation Age of Onset  . Hypertension Father   . Heart attack Father     Social History:  reports that she has never smoked. She does not have any smokeless tobacco history on file. She reports that she drinks alcohol. She reports that she does not use drugs.  Review of Systems:  HYPERTENSION:  followed by PCP/cardiologist, blood pressure Is well controlled  HYPERLIPIDEMIA: The lipid abnormality consists of elevated LDL, taking Vytorin, followed by cardiologist.   She was tried on Crestor but she thinks this caused leg pain and is  on Vytorin   Lab Results  Component Value Date   CHOL 136 10/25/2015   HDL 38 (L) 10/25/2015   LDLCALC 77 10/25/2015   LDLDIRECT 121.2 05/08/2013   TRIG 107 10/25/2015   CHOLHDL 3.6 10/25/2015    History of mild hypothyroidism, previously treated by PCP  She has previously required progressively higher doses of levothyroxine.  She is  taking 100 g daily since 11/2014 and does feel fairly good   Lab Results  Component Value Date   TSH 3.18 10/30/2015   TSH 2.17 03/05/2015   TSH 5.18 (H) 12/03/2014        Examination:   BP 132/70   Pulse 69   Ht 5' 5"  (1.651 m)   Wt 193 lb (87.5 kg)   SpO2 94%   BMI 32.12 kg/m   Body mass index is 32.12 kg/m.     ASSESSMENT/ PLAN:   Diabetes type 2 With BMI 31  See history of present illness for  discussion of current management, blood sugar patterns and problems identified  Her blood sugars are still not well controlled with the V-go  pump 30 unit basal and fasting readings are mostly around 150-160 She is also getting high readings after evening meal with current 8 units bolus Although she may do better with the 40 unit basal this may causing hypoglycemia  She agrees to start back on Victoza even though it is expensive as it had helped her in combination with insulin previously and would limit her insulin doses and weight gain She will start with 0.6 with the first 3 days She will bolus at least 5 clicks at suppertime May need to reduce amount of clicks at breakfast and lunch also   Patient Instructions  5 clicks at dinner, may need 3 at lunch and Bfst  Victoza 0.6 for 3 days then 1.2 mg  Check blood sugars on waking up    Also check blood sugars about 2 hours after a meal and do this after different meals by rotation  Recommended blood sugar levels on waking up is 90-130 and about 2 hours after meal is 130-160  Please bring your blood sugar monitor to each visit, thank you     [facility] 04/14/2016, 8:15 AM

## 2016-04-15 ENCOUNTER — Telehealth: Payer: Self-pay | Admitting: Endocrinology

## 2016-04-15 MED ORDER — V-GO 30 KIT: PACK | 1 refills | Status: DC

## 2016-04-15 NOTE — Telephone Encounter (Signed)
Rx submitted

## 2016-04-15 NOTE — Telephone Encounter (Signed)
Please send prescription for the V-go 30 unit pumps

## 2016-04-15 NOTE — Telephone Encounter (Signed)
[employer] calling to let us know the vgo is approved for one year as of aug 1st, 2017, letter is coming

## 2016-04-15 NOTE — Telephone Encounter (Signed)
See below to be advise.

## 2016-04-16 NOTE — Telephone Encounter (Signed)
If she has any Lantus left she can take the pump of the night before and inject 20 units of Lantus She can resume the pump after she comes home

## 2016-04-16 NOTE — Telephone Encounter (Signed)
Patient need to know how to use her insulin pump before her coloscopy appt time 08/10. Please advise

## 2016-04-16 NOTE — Telephone Encounter (Signed)
See below and please advise, Thanks!  

## 2016-04-17 MED ORDER — V-GO 30 KIT: PACK | 1 refills | Status: DC

## 2016-04-17 NOTE — Telephone Encounter (Signed)
I contacted the pt and advised of Md's instructions. Pt voiced understanding and requested a rx for V-30 to be sent to her pharmacy. Rx submitted and pt advised to call if she has any issues. Pt voiced understanding.

## 2016-04-20 ENCOUNTER — Telehealth: Payer: Self-pay | Admitting: Endocrinology

## 2016-04-20 ENCOUNTER — Other Ambulatory Visit: Payer: Self-pay

## 2016-04-20 MED ORDER — INSULIN LISPRO 100 UNIT/ML ~~LOC~~ SOLN: SUBCUTANEOUS | 2 refills | Status: DC

## 2016-04-20 NOTE — Telephone Encounter (Signed)
PT requests call back as soon as you can, she said it is about a very important issue and needs to speak with you.

## 2016-04-20 NOTE — Telephone Encounter (Signed)
I contacted the pt and requested a call back via vm.

## 2016-04-21 ENCOUNTER — Other Ambulatory Visit: Payer: Self-pay | Admitting: Cardiology

## 2016-04-22 NOTE — Telephone Encounter (Signed)
I contacted the pt and she verbalized understanding of MD's instructions and had no further questions at this time.

## 2016-04-22 NOTE — Telephone Encounter (Signed)
See note below and please advise, Thanks! 

## 2016-04-22 NOTE — Telephone Encounter (Signed)
She had been told to leave off the pump the evening before colonoscopy and take Lantus 20 units at bedtime

## 2016-04-22 NOTE — Telephone Encounter (Signed)
Patient is having colonoscopy tomorrow  And can't eat anything just drinking water, b/ is dropping b/s is 79 right not. Please advise on what to do.

## 2016-04-23 DIAGNOSIS — K5730 Diverticulosis of large intestine without perforation or abscess without bleeding: Secondary | ICD-10-CM | POA: Diagnosis not present

## 2016-04-23 DIAGNOSIS — Z86010 Personal history of colonic polyps: Secondary | ICD-10-CM | POA: Diagnosis not present

## 2016-05-04 ENCOUNTER — Emergency Department (HOSPITAL_COMMUNITY): Payer: Medicare Other

## 2016-05-04 ENCOUNTER — Emergency Department (HOSPITAL_COMMUNITY)
Admission: EM | Admit: 2016-05-04 | Discharge: 2016-05-04 | Disposition: A | Discharge status: Home / Self Care | Payer: Medicare Other | Attending: Emergency Medicine | Admitting: Emergency Medicine

## 2016-05-04 ENCOUNTER — Encounter (HOSPITAL_COMMUNITY): Payer: Self-pay

## 2016-05-04 DIAGNOSIS — E119 Type 2 diabetes mellitus without complications: Secondary | ICD-10-CM | POA: Insufficient documentation

## 2016-05-04 DIAGNOSIS — Y939 Activity, unspecified: Secondary | ICD-10-CM | POA: Diagnosis not present

## 2016-05-04 DIAGNOSIS — S0181XA Laceration without foreign body of other part of head, initial encounter: Secondary | ICD-10-CM | POA: Insufficient documentation

## 2016-05-04 DIAGNOSIS — W19XXXA Unspecified fall, initial encounter: Secondary | ICD-10-CM

## 2016-05-04 DIAGNOSIS — S0990XA Unspecified injury of head, initial encounter: Secondary | ICD-10-CM | POA: Diagnosis not present

## 2016-05-04 DIAGNOSIS — R112 Nausea with vomiting, unspecified: Secondary | ICD-10-CM | POA: Diagnosis not present

## 2016-05-04 DIAGNOSIS — S0121XA Laceration without foreign body of nose, initial encounter: Secondary | ICD-10-CM | POA: Diagnosis not present

## 2016-05-04 DIAGNOSIS — Y999 Unspecified external cause status: Secondary | ICD-10-CM | POA: Diagnosis not present

## 2016-05-04 DIAGNOSIS — E039 Hypothyroidism, unspecified: Secondary | ICD-10-CM | POA: Diagnosis not present

## 2016-05-04 DIAGNOSIS — S80211A Abrasion, right knee, initial encounter: Secondary | ICD-10-CM | POA: Diagnosis not present

## 2016-05-04 DIAGNOSIS — S022XXA Fracture of nasal bones, initial encounter for closed fracture: Secondary | ICD-10-CM | POA: Insufficient documentation

## 2016-05-04 DIAGNOSIS — W01198A Fall on same level from slipping, tripping and stumbling with subsequent striking against other object, initial encounter: Secondary | ICD-10-CM | POA: Diagnosis not present

## 2016-05-04 DIAGNOSIS — IMO0002 Reserved for concepts with insufficient information to code with codable children: Secondary | ICD-10-CM

## 2016-05-04 DIAGNOSIS — I10 Essential (primary) hypertension: Secondary | ICD-10-CM | POA: Insufficient documentation

## 2016-05-04 DIAGNOSIS — Y9289 Other specified places as the place of occurrence of the external cause: Secondary | ICD-10-CM | POA: Diagnosis not present

## 2016-05-04 DIAGNOSIS — S0993XA Unspecified injury of face, initial encounter: Secondary | ICD-10-CM | POA: Diagnosis not present

## 2016-05-04 DIAGNOSIS — I2510 Atherosclerotic heart disease of native coronary artery without angina pectoris: Secondary | ICD-10-CM | POA: Insufficient documentation

## 2016-05-04 MED ORDER — SODIUM CHLORIDE 0.9 % IV BOLUS (SEPSIS)
500.0000 mL | Freq: Once | INTRAVENOUS | Status: AC
  Administered 2016-05-04: 500 mL via INTRAVENOUS

## 2016-05-04 MED ORDER — ONDANSETRON 4 MG PO TBDP
4.0000 mg | ORAL_TABLET | Freq: Once | ORAL | Status: AC | PRN
  Administered 2016-05-04: 4 mg via ORAL
  Filled 2016-05-04: qty 1

## 2016-05-04 MED ORDER — TRAMADOL HCL 50 MG PO TABS: 50.0000 mg | ORAL_TABLET | Freq: Four times a day (QID) | ORAL | 0 refills | Status: DC | PRN

## 2016-05-04 MED ORDER — LIDOCAINE HCL (PF) 1 % IJ SOLN
5.0000 mL | Freq: Once | INTRAMUSCULAR | Status: AC
  Administered 2016-05-04: 5 mL
  Filled 2016-05-04: qty 5

## 2016-05-04 MED ORDER — CEPHALEXIN 500 MG PO CAPS: 500.0000 mg | ORAL_CAPSULE | Freq: Two times a day (BID) | ORAL | 0 refills | Status: AC

## 2016-05-04 MED ORDER — ONDANSETRON HCL 4 MG PO TABS: 4.0000 mg | ORAL_TABLET | Freq: Three times a day (TID) | ORAL | 0 refills | Status: DC | PRN

## 2016-05-04 MED ORDER — PROMETHAZINE HCL 25 MG/ML IJ SOLN
12.5000 mg | Freq: Once | INTRAMUSCULAR | Status: AC
  Administered 2016-05-04: 12.5 mg via INTRAVENOUS
  Filled 2016-05-04: qty 1

## 2016-05-04 NOTE — Discharge Instructions (Signed)
You were seen in the Emergency Department (ED) today for a head injury.  Based on your evaluation, you may have sustained a concussion (or bruise) to your brain.  If you had a CT scan done, it did not show any evidence of serious injury or bleeding.    You also have a nose fracture and laceration. We are starting antibiotics and have repaired the laceration with suture. The suture will need to come out in 3-5 days.   Symptoms to expect from a concussion include nausea, mild to moderate headache, difficulty concentrating or sleeping, and mild lightheadedness.  These symptoms should improve over the next few days to weeks, but it may take many weeks before you feel back to normal.  Return to the emergency department or follow-up with your primary care doctor if your symptoms are not improving over this time.  Signs of a more serious head injury include vomiting, severe headache, excessive sleepiness or confusion, and weakness or numbness in your face, arms or legs.  Return immediately to the Emergency Department if you experience any of these more concerning symptoms.    Rest, avoid strenuous physical or mental activity, and avoid activities that could potentially result in another head injury until all your symptoms from this head injury are completely resolved for at least 2-3 weeks.  If you participate in sports, get cleared by your doctor or trainer before returning to play.  You may take ibuprofen or acetaminophen over the counter according to label instructions for mild headache or scalp soreness.

## 2016-05-04 NOTE — ED Provider Notes (Signed)
Emergency Department Provider Note   I have reviewed the triage vital signs and the nursing notes.   HISTORY  Chief Complaint Fall   HPI Crystal Hobbs is a 65 y.o. female with PMH of HLD, DM, HTN, and CAD on ASA (325 mg daily) presents to the emergency department after mechanical fall. The patient was sitting pool side when she stood up and lost her footing. Patient states she tripped on a towel nearby and fell straight to the ground. She was unable to put her hands forward to break her fall and her face hit the concrete surface. The patient noted some bleeding from her nose and mild abrasions to both knees. She was able to call for help but began feeling nauseated and had heavy bleeding especially from her nose. EMS was called and the patient was transported to the emergency department for evaluation. No loss of consciousness. She had no preceding chest pain or heart palpitations. She does have continued nausea. No abdominal discomfort. Mild blurry vision. No pain with EOM or double vision. Teeth feel grossly normal. No difficulty breathing or swallowing. Patient reports last tetanus shot with 2-3 years prior.    Past Medical History:  Diagnosis Date  . Arthritis   . Complication of anesthesia    takes along time to wake up  . Contact lens/glasses fitting    wears glasses or contacts  . Coronary artery disease 01/2007   s/p PCI of diagonal   . Diabetes mellitus without complication ([place])   . Dyslipidemia   . Hyperlipidemia   . Hypertension   . Hypothyroidism   . Snores   . Vertigo     Patient Active Problem List   Diagnosis Date Noted  . 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 Hyperlipidemia   . Hypertension   . Hypothyroidism 05/16/2013  . Type II diabetes mellitus, uncontrolled ([place]) 05/04/2013  . Other and unspecified hyperlipidemia 05/04/2013  . Coronary artery disease 01/13/2007    Past Surgical History:  Procedure Laterality Date  . CARDIAC  CATHETERIZATION  2008   stent  . SHOULDER ARTHROSCOPY WITH OPEN ROTATOR CUFF REPAIR Right 02/28/2013   Procedure: right shoulder arthroscopy, distal clavicle sculpting, open rotator cuff repair;  Surgeon: Cammie Sickle., MD;  Location: [facility];  Service: Orthopedics;  Laterality: Right;    Current Outpatient Rx  . Order #: [id number] Class: Historical Med  . Order #: [id number] Class: Historical Med  . Order #: [id number] Class: Normal  . Order #: [id number] Class: Normal  . Order #: [id number] Class: Normal  . Order #: [id number] Class: Normal  . Order #: [id number] Class: Normal  . Order #: [id number] Class: Historical Med  . Order #: [id number] Class: Normal  . Order #: [id number] Class: Normal  . Order #: [id number] Class: Print  . Order #: [id number] Class: Print  . Order #: [id number] Class: Normal  . Order #: [id number] Class: Normal  . Order #: [id number] Class: Normal  . Order #: [id number] Class: Print  . Order #: [id number] Class: Print    Allergies Actos [pioglitazone]; Metformin and related; Novocain [procaine]; and Sulfa antibiotics  Family History  Problem Relation Age of Onset  . Hypertension Father   . Heart attack Father     Social History Social History  Substance Use Topics  . Smoking status: Never Smoker  . Smokeless tobacco: Not on file  . Alcohol use Yes     Comment: occ    Review of Systems  Constitutional: No fever/chills Eyes: No visual changes. ENT: No sore throat. Cardiovascular: Denies chest  pain. Respiratory: Denies shortness of breath. Gastrointestinal: No abdominal pain. Positive nausea and vomiting.  No diarrhea.  No constipation. Genitourinary: Negative for dysuria. Musculoskeletal: Negative for back pain. Skin: Negative for rash. Abrasion to nose, face, and bilateral knees.  Neurological: Negative for headaches, focal weakness or numbness.  10-point ROS otherwise negative.  ____________________________________________   PHYSICAL  EXAM:  VITAL SIGNS: ED Triage Vitals  Enc Vitals Group     BP 05/04/16 1730 176/80     Pulse Rate 05/04/16 1730 60     Resp 05/04/16 1730 16     SpO2 05/04/16 1730 99 %     Weight 05/04/16 1721 193 lb (87.5 kg)     Height 05/04/16 1721 5\' 5"  (1.651 m)     Pain Score 05/04/16 1721 2   Constitutional: Alert and oriented. Well appearing and in no acute distress. Eyes: Conjunctivae are normal. PERRL. EOMI without pain or double vision.  Head: Atraumatic. Nose: No congestion/rhinnorhea. 1 cm laceration/abrasion to bridge of nose. Dried blood in both nares. No gross deformity.  Mouth/Throat: Mucous membranes are moist.  Oropharynx non-erythematous. Neck: No stridor.  No meningeal signs. No cervical spine tenderness to palpation. Cardiovascular: Normal rate, regular rhythm. Good peripheral circulation. Grossly normal heart sounds.   Respiratory: Normal respiratory effort.  No retractions. Lungs CTAB. Gastrointestinal: Soft and nontender. No distention.  Musculoskeletal: No lower extremity tenderness nor edema. No gross deformities of extremities. Neurologic:  Normal speech and language. No gross focal neurologic deficits are appreciated.  Skin:  Skin is warm, dry and intact. Nose abrasion noted above. Abrasions to bilateral knees.  Psychiatric: Mood and affect are normal. Speech and behavior are normal.  ____________________________________________  RADIOLOGY  Ct Head Wo Contrast  Result Date: 05/04/2016 CLINICAL DATA:  Fall, facial injury, headache EXAM: CT HEAD WITHOUT CONTRAST CT MAXILLOFACIAL WITHOUT CONTRAST TECHNIQUE: Multidetector CT imaging of the head and maxillofacial structures were performed using the standard protocol without intravenous contrast. Multiplanar CT image reconstructions of the maxillofacial structures were also generated. COMPARISON:  None. FINDINGS: CT HEAD FINDINGS No evidence of parenchymal hemorrhage or extra-axial fluid collection. No mass lesion, mass  effect, or midline shift. No CT evidence of acute infarction. Mild subcortical white matter and periventricular small vessel ischemic changes. Cerebral volume is within normal limits.  No ventriculomegaly. The visualized paranasal sinuses are essentially clear. The mastoid air cells are unopacified. Nondisplaced bilateral nasal bone fractures (series 202/image 9). Overlying mild soft tissue swelling. No evidence of calvarial fracture. CT MAXILLOFACIAL FINDINGS Nondisplaced, mildly comminuted bilateral nasal bone fractures (series 302/ images 27 and 30). Overlying mild soft tissue swelling. Otherwise, no evidence of maxillofacial fracture. The bilateral orbits, including the globes and retroconal soft tissues, are within normal limits. The mandible is intact. The bilateral mandibular condyles are well-seated in the TMJs. The visualized paranasal sinuses are essentially clear. The mastoid air cells are unopacified. Cervical spine is intact to the bottom of C6. Mild degenerative changes of the mid cervical spine. IMPRESSION: Nondisplaced bilateral nasal bone fractures. Otherwise, no evidence of maxillofacial fracture. No evidence of acute intracranial abnormality. Electronically Signed   By: Julian Hy M.D.   On: 05/04/2016 18:45   Ct Maxillofacial Wo Contrast  Result Date: 05/04/2016 CLINICAL DATA:  Fall, facial injury, headache EXAM: CT HEAD WITHOUT CONTRAST CT MAXILLOFACIAL WITHOUT CONTRAST TECHNIQUE: Multidetector CT imaging of the head and maxillofacial structures were performed using the standard protocol without intravenous contrast. Multiplanar CT image reconstructions of the maxillofacial structures were also generated. COMPARISON:  None. FINDINGS: CT HEAD FINDINGS No evidence of parenchymal hemorrhage or extra-axial fluid collection. No mass lesion, mass effect, or midline shift. No CT evidence of acute infarction. Mild subcortical white matter and periventricular small vessel ischemic changes.  Cerebral volume is within normal limits.  No ventriculomegaly. The visualized paranasal sinuses are essentially clear. The mastoid air cells are unopacified. Nondisplaced bilateral nasal bone fractures (series 202/image 9). Overlying mild soft tissue swelling. No evidence of calvarial fracture. CT MAXILLOFACIAL FINDINGS Nondisplaced, mildly comminuted bilateral nasal bone fractures (series 302/ images 27 and 30). Overlying mild soft tissue swelling. Otherwise, no evidence of maxillofacial fracture. The bilateral orbits, including the globes and retroconal soft tissues, are within normal limits. The mandible is intact. The bilateral mandibular condyles are well-seated in the TMJs. The visualized paranasal sinuses are essentially clear. The mastoid air cells are unopacified. Cervical spine is intact to the bottom of C6. Mild degenerative changes of the mid cervical spine. IMPRESSION: Nondisplaced bilateral nasal bone fractures. Otherwise, no evidence of maxillofacial fracture. No evidence of acute intracranial abnormality. Electronically Signed   By: Julian Hy M.D.   On: 05/04/2016 18:45    ____________________________________________   PROCEDURES  Procedure(s) performed:   Marland KitchenMarland KitchenLaceration Repair Date/Time: 05/05/2016 9:29 AM Performed by: LONG, Wonda Olds Authorized by: Margette Fast   Consent:    Consent obtained:  Verbal   Consent given by:  Patient   Risks discussed:  Infection, pain, retained foreign body, vascular damage, poor wound healing, poor cosmetic result, need for additional repair and nerve damage   Alternatives discussed:  No treatment Universal protocol:    Procedure explained and questions answered to patient or proxy's satisfaction: yes     Imaging studies available: yes     Patient identity confirmed:  Verbally with patient and hospital-assigned identification number Anesthesia (see MAR for exact dosages):    Anesthesia method:  Local infiltration   Local anesthetic:   Lidocaine 1% w/o epi Laceration details:    Location:  Face   Face location:  Nose   Length (cm):  1   Depth (mm):  3 Repair type:    Repair type:  Simple Pre-procedure details:    Preparation:  Patient was prepped and draped in usual sterile fashion and imaging obtained to evaluate for foreign bodies Exploration:    Hemostasis achieved with:  Direct pressure   Wound exploration: entire depth of wound probed and visualized     Wound extent: underlying fracture     Wound extent: no foreign bodies/material noted     Contaminated: no   Treatment:    Area cleansed with:  Saline   Amount of cleaning:  Standard   Visualized foreign bodies/material removed: no   Skin repair:    Repair method:  Sutures   Suture size:  6-0   Suture material:  Prolene   Suture technique:  Simple interrupted   Number of sutures:  2 Approximation:    Approximation:  Close   Vermilion border: well-aligned   Post-procedure details:    Dressing:  Open (no dressing)   Patient tolerance of procedure:  Tolerated well, no immediate complications     ____________________________________________   INITIAL IMPRESSION / ASSESSMENT AND PLAN / ED COURSE  Pertinent labs & imaging results that were available during my care of the patient were reviewed by me and considered in my medical decision making (see chart for details).  Patient resents to the emergency department for evaluation of face injuries and nausea/vomiting after mechanical fall.  Suspect the patient has a mild to moderate concussion. She does take full dose aspirin daily. Wounds to the nose are hemostatic at this time. She has very mild bilateral knee abrasions with no bony tenderness. Full range of motion of all upper and lower extremities. No abdominal discomfort. Patient is sure her fall was mechanical and describes tripping over a towel. No preceding chest pain or heart palpitations. None of these symptoms after the fall. No difficulty breathing.  Plan for CT scan of the head and face specially given the patient's nausea and vomiting. We will reevaluate wounds afterwards. Nasal laceration and a small well approximated at this time.   Laceration repaired without complication. Discussed follow up for suture removal in 3-5 days. Started abx with nasal laceration and underlying fracture. CT head negative for bleed or fracture. Discussed return precautions and possibility for concussion. Discussed concussion mgmt and home. Will discharge with nausea medications and small amount of breakthrough pain medication. Patient ambulatory at discharge with vomiting controlled.   At this time, I do not feel there is any life-threatening condition present. I have reviewed and discussed all results (EKG, imaging, lab, urine as appropriate), exam findings with patient. I have reviewed nursing notes and appropriate previous records.  I feel the patient is safe to be discharged home without further emergent workup. Discussed usual and customary return precautions. Patient and family (if present) verbalize understanding and are comfortable with this plan.  Patient will follow-up with their primary care provider. If they do not have a primary care provider, information for follow-up has been provided to them. All questions have been answered.  ____________________________________________  FINAL CLINICAL IMPRESSION(S) / ED DIAGNOSES  Final diagnoses:  Fall, initial encounter  Head injury, initial encounter  Nose fracture, closed, initial encounter  Laceration  Non-intractable vomiting with nausea, vomiting of unspecified type     MEDICATIONS GIVEN DURING THIS VISIT:  Medications  ondansetron (ZOFRAN-ODT) disintegrating tablet 4 mg (4 mg Oral Given 05/04/16 1724)  sodium chloride 0.9 % bolus 500 mL (0 mLs Intravenous Stopped 05/04/16 2056)  promethazine (PHENERGAN) injection 12.5 mg (12.5 mg Intravenous Given 05/04/16 1946)  lidocaine (PF) (XYLOCAINE) 1 %  injection 5 mL (5 mLs Infiltration Given 05/04/16 2000)     NEW OUTPATIENT MEDICATIONS STARTED DURING THIS VISIT:  Discharge Medication List as of 05/04/2016  8:39 PM    START taking these medications   Details  cephALEXin (KEFLEX) 500 MG capsule Take 1 capsule (500 mg total) by mouth 2 (two) times daily., Starting Mon 05/04/2016, Until Sat 05/09/2016, Print    traMADol (ULTRAM) 50 MG tablet Take 1 tablet (50 mg total) by mouth every 6 (six) hours as needed., Starting Mon 05/04/2016, Print          Note:  This document was prepared using Dragon voice recognition software and may include unintentional dictation errors.  Nanda Quinton, MD Emergency Medicine   Margette Fast, MD 05/05/16 [phone]

## 2016-05-04 NOTE — ED Triage Notes (Signed)
Pt BIB [facility] for evaluation of injuries after mechanical fall today. Pt. States she tripped over her towel and fell face first. Pt. Has abrasions to bilateral knees, bridge of nose, and forehead. Pt. Reports nausea, no vomiting. Denies LOC.

## 2016-05-04 NOTE — ED Notes (Signed)
Suture Cart at bedside.  

## 2016-05-04 NOTE — ED Notes (Signed)
Patient transported to CT 

## 2016-05-09 ENCOUNTER — Emergency Department (HOSPITAL_COMMUNITY)
Admission: EM | Admit: 2016-05-09 | Discharge: 2016-05-09 | Disposition: A | Discharge status: Home / Self Care | Payer: Medicare Other | Attending: Emergency Medicine | Admitting: Emergency Medicine

## 2016-05-09 ENCOUNTER — Encounter (HOSPITAL_COMMUNITY): Payer: Self-pay | Admitting: Emergency Medicine

## 2016-05-09 DIAGNOSIS — I2510 Atherosclerotic heart disease of native coronary artery without angina pectoris: Secondary | ICD-10-CM | POA: Diagnosis not present

## 2016-05-09 DIAGNOSIS — Z7982 Long term (current) use of aspirin: Secondary | ICD-10-CM | POA: Diagnosis not present

## 2016-05-09 DIAGNOSIS — E039 Hypothyroidism, unspecified: Secondary | ICD-10-CM | POA: Insufficient documentation

## 2016-05-09 DIAGNOSIS — Z794 Long term (current) use of insulin: Secondary | ICD-10-CM | POA: Insufficient documentation

## 2016-05-09 DIAGNOSIS — E119 Type 2 diabetes mellitus without complications: Secondary | ICD-10-CM | POA: Diagnosis not present

## 2016-05-09 DIAGNOSIS — I10 Essential (primary) hypertension: Secondary | ICD-10-CM | POA: Insufficient documentation

## 2016-05-09 DIAGNOSIS — Z4802 Encounter for removal of sutures: Secondary | ICD-10-CM | POA: Diagnosis not present

## 2016-05-09 NOTE — ED Triage Notes (Signed)
Pt. Stated, I had fallen on the side of pool and I have 3 stitches that need to be removed. Pt. Concerned about scab not coming off.

## 2016-05-09 NOTE — ED Provider Notes (Signed)
[facility] DEPT Provider Note   CSN: [id number] Arrival date & time: 05/09/16  1125     History   Chief Complaint Chief Complaint  Patient presents with  . Suture / Staple Removal    HPI Crystal Hobbs is a 65 y.o. female.  The history is provided by the patient. No language interpreter was used.  Suture / Staple Removal  This is a new problem. The current episode started more than 1 week ago. The problem has not changed since onset.Nothing aggravates the symptoms. Nothing relieves the symptoms. She has tried nothing for the symptoms.   Pt here for suture removal no complaints.  Past Medical History:  Diagnosis Date  . Arthritis   . Complication of anesthesia    takes along time to wake up  . Contact lens/glasses fitting    wears glasses or contacts  . Coronary artery disease 01/2007   s/p PCI of diagonal   . Diabetes mellitus without complication ([place])   . Dyslipidemia   . Hyperlipidemia   . Hypertension   . Hypothyroidism   . Snores   . Vertigo     Patient Active Problem List   Diagnosis Date Noted  . 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 Hyperlipidemia   . Hypertension   . Hypothyroidism 05/16/2013  . Type II diabetes mellitus, uncontrolled ([place]) 05/04/2013  . Other and unspecified hyperlipidemia 05/04/2013  . Coronary artery disease 01/13/2007    Past Surgical History:  Procedure Laterality Date  . CARDIAC CATHETERIZATION  2008   stent  . SHOULDER ARTHROSCOPY WITH OPEN ROTATOR CUFF REPAIR Right 02/28/2013   Procedure: right shoulder arthroscopy, distal clavicle sculpting, open rotator cuff repair;  Surgeon: Cammie Sickle., MD;  Location: [facility];  Service: Orthopedics;  Laterality: Right;    OB History    No data available       Home Medications    Prior to Admission medications   Medication Sig Start Date End Date Taking? Authorizing Provider  acetaminophen (TYLENOL) 325 MG tablet Take 650 mg by mouth every  6 (six) hours as needed for mild pain.     Historical Provider, MD  aspirin 81 MG tablet Take 81 mg by mouth QID. PT STATES SHE TAKES 3-4 ASA QD    Historical Provider, MD  B-D UF III MINI PEN NEEDLES 31G X 5 MM MISC USE 5 TIMES A DAY AS DIRECTED 05/20/15   Elayne Snare, MD  cephALEXin (KEFLEX) 500 MG capsule Take 1 capsule (500 mg total) by mouth 2 (two) times daily. 05/04/16 05/09/16  Margette Fast, MD  glucose blood (ONE TOUCH ULTRA TEST) test strip Use as instructed to check blood sugars 3 times per day dx code E11.65 06/11/15   Elayne Snare, MD  HUMALOG KWIKPEN 100 UNIT/ML KiwkPen INJECT 6 UNITS BEFORE BREAKFAST AND LUNCH AND 8 UNITS BEFORE SUPPER Patient not taking: Reported on 04/13/2016 01/23/16   Elayne Snare, MD  Insulin Disposable Pump (V-GO 30) KIT Use 1 per day Patient not taking: Reported on 05/04/2016 04/17/16   Elayne Snare, MD  insulin lispro (HUMALOG) 100 UNIT/ML injection 40 units max daily per vgo pump. 04/20/16   Elayne Snare, MD  LANTUS SOLOSTAR 100 UNIT/ML Solostar Pen INJECT 24 TO 26 UNITS SUBCUTANEOUSLY DAILY AT 10 PM Patient not taking: Reported on 04/13/2016 02/07/16   Elayne Snare, MD  levothyroxine (SYNTHROID, LEVOTHROID) 100 MCG tablet take 1 tablet by mouth once daily 12/09/15   Elayne Snare, MD  Liraglutide Donna Bernard)  18 MG/3ML SOPN INJECT 1.2 mg  SUBCUTANEOUSLY DAILY 04/13/16   Elayne Snare, MD  losartan (COZAAR) 100 MG tablet Take 1 tablet (100 mg total) by mouth daily. Patient is overdue for an appt. Please call and schedule for further refills 04/21/16   Sueanne Margarita, MD  nitroGLYCERIN (NITROSTAT) 0.4 MG SL tablet Place 1 tablet (0.4 mg total) under the tongue every 5 (five) minutes as needed for chest pain. 11/07/14   Eileen Stanford, PA-C  omega-3 acid ethyl esters (LOVAZA) 1 G capsule Take 2 g by mouth 2 (two) times daily.    Historical Provider, MD  ondansetron (ZOFRAN) 4 MG tablet Take 1 tablet (4 mg total) by mouth every 8 (eight) hours as needed for nausea or vomiting. 05/04/16   Margette Fast, MD  traMADol (ULTRAM) 50 MG tablet Take 1 tablet (50 mg total) by mouth every 6 (six) hours as needed. 05/04/16   Margette Fast, MD  VYTORIN 10-40 MG tablet take 1 tablet by mouth once daily 12/03/15   Sueanne Margarita, MD    Family History Family History  Problem Relation Age of Onset  . Hypertension Father   . Heart attack Father     Social History Social History  Substance Use Topics  . Smoking status: Never Smoker  . Smokeless tobacco: Never Used  . Alcohol use Yes     Comment: occ     Allergies   Actos [pioglitazone]; Metformin and related; Novocain [procaine]; and Sulfa antibiotics   Review of Systems Review of Systems  All other systems reviewed and are negative.    Physical Exam Updated Vital Signs BP 133/74 (BP Location: Right Arm)   Pulse 69   Temp 98.1 F (36.7 C) (Oral)   Resp 16   Ht 5' 6.5" (1.689 m)   Wt 86.2 kg   SpO2 97%   BMI 30.21 kg/m   Physical Exam  Constitutional: She appears well-developed and well-nourished. No distress.  HENT:  Head: Normocephalic and atraumatic.  2 small sutures nose. No infection.    Eyes: Conjunctivae are normal.  Neck: Neck supple.  Cardiovascular: Normal rate and regular rhythm.   No murmur heard. Pulmonary/Chest: Effort normal and breath sounds normal. No respiratory distress.  Abdominal: Soft. There is no tenderness.  Musculoskeletal: She exhibits no edema.  Neurological: She is alert.  Skin: Skin is warm and dry.  Psychiatric: She has a normal mood and affect.  Nursing note and vitals reviewed.    ED Treatments / Results  Labs (all labs ordered are listed, but only abnormal results are displayed) Labs Reviewed - No data to display  EKG  EKG Interpretation None       Radiology No results found.  Procedures Procedures (including critical care time)  Medications Ordered in ED Medications - No data to display   Initial Impression / Assessment and Plan / ED Course  I have reviewed  the triage vital signs and the nursing notes.  Pertinent labs & imaging results that were available during my care of the patient were reviewed by me and considered in my medical decision making (see chart for details).  Clinical Course   2 small sutures removed by me.   Final Clinical Impressions(s) / ED Diagnoses   Final diagnoses:  Encounter for removal of sutures    New Prescriptions Discharge Medication List as of 05/09/2016 12:51 PM       Fransico Meadow, PA-C 05/09/16 [place],  MD 05/10/16 1051

## 2016-05-15 ENCOUNTER — Other Ambulatory Visit: Payer: Self-pay

## 2016-05-15 ENCOUNTER — Telehealth: Payer: Self-pay | Admitting: Endocrinology

## 2016-05-15 MED ORDER — V-GO 30 KIT: PACK | 1 refills | Status: DC

## 2016-05-15 MED ORDER — INSULIN LISPRO 100 UNIT/ML ~~LOC~~ SOLN: SUBCUTANEOUS | 2 refills | Status: DC

## 2016-05-15 NOTE — Telephone Encounter (Signed)
PT requests call back from [facility]

## 2016-05-15 NOTE — Telephone Encounter (Signed)
Spoke with the patient and she request her humalog an v-go pump to be refilled to [facility] and [facility] to be sure she received her needed refills this weekend.

## 2016-05-15 NOTE — Telephone Encounter (Signed)
PT needs Humulog refilled, they said that she can't get it because she needs a new script.  She requests a call back to discuss.

## 2016-05-15 NOTE — Telephone Encounter (Signed)
I contacted the patient and advised we have submitted a refill for the humalog. Requested a call back if the patient would like to discuss.

## 2016-05-20 ENCOUNTER — Ambulatory Visit: Payer: Medicare Other | Admitting: Endocrinology

## 2016-05-21 ENCOUNTER — Ambulatory Visit: Payer: Medicare Other | Admitting: Endocrinology

## 2016-05-29 ENCOUNTER — Encounter: Payer: Self-pay | Admitting: Endocrinology

## 2016-05-29 ENCOUNTER — Ambulatory Visit (INDEPENDENT_AMBULATORY_CARE_PROVIDER_SITE_OTHER): Payer: Medicare Other | Admitting: Endocrinology

## 2016-05-29 VITALS — BP 130/82 | HR 71 | Temp 97.9°F | Resp 16 | Ht 67.0 in | Wt 192.0 lb

## 2016-05-29 DIAGNOSIS — E1165 Type 2 diabetes mellitus with hyperglycemia: Secondary | ICD-10-CM

## 2016-05-29 DIAGNOSIS — Z794 Long term (current) use of insulin: Secondary | ICD-10-CM

## 2016-05-29 NOTE — Patient Instructions (Addendum)
6 Clicks at supper, 1 at bedtime  ? No Victoza

## 2016-05-29 NOTE — Progress Notes (Signed)
Patient ID: Crystal Hobbs, female   DOB: 1951-08-17, 65 y.o.   MRN: [id number]   Reason for Appointment: Diabetes follow-up   History of Present Illness   Diagnosis: Type 2 DIABETES MELITUS, date of diagnosis:  2011   Past history: Her initial presentation with diabetes was with significant symptoms and glucose of 947 Initially was treated with insulin and subsequently on metformin which she could not tolerate  Since she was not responding to Victoza and had significant hyperglycemia she was started on basal bolus insulin regimen instead  However had required relatively large doses of insulin with inadequate control and blood sugars subsequently improved significantly with adding Victoza in 10/13  Her  A1c was 6.4 in 4/14 and she had overall good control with minimal hypoglycemia She was taken off  Afrezza because of her high out-of-pocket expense and she was tried on V-go pump which she had some difficulties getting this through the pharmacy  RECENT history:    Insulin regimen: V-go 30 units Humalog boluses 04-21-09, 3 times a day   She is on a regimen of basal bolus insulin regimen, using the V-go pump as of 04/08/16  Her A1c has been 7.5 twice  Current blood sugar patterns and problems identified:  Because of her fasting readings being high and also having higher readings after evening meal she was told to restart Victoza about 6 weeks ago.  However even though her blood sugars are better in the morning she is doing 2 clicks with her pump at bedtime now instead of one.  Also she thinks that even with taking 5 clicks at suppertime her readings about 3 hours after evening meal are 180+  Did not bring any readings for review since she lost her meter about a week ago  Blood sugars are fairly good during the day  She has been intermittent with her exercise because of various problems  She thinks her blood sugars went up after colonoscopy and after she had broken her  nose  No recent labs available  Side effects from medications:  diarrhea from metformin,? Hair loss from Actos Proper timing of medications in relation to meals: Yes         Monitors blood glucose: Recently 3 times  a day.    Glucometer: One Touch.          Blood Glucose readings from monitor   Mean values apply above for all meters except median for One Touch  PRE-MEAL Fasting Lunch Dinner Bedtime Overall  Glucose range: 100-135 90-100 130 185   Mean/median:        Hypoglycemia: None recently      Meals: 3 meals per day.  Breakfast: may have half a peanut better sandwich Or a granola bar.   Dinner at 6-8 pm        Physical activity: exercise: , doing some walking and is able to this usually on level surface.  Again recently trying to play tennis            Diabetes education visit: Most recent: 09/2246           Complications: are: None     Wt Readings from Last 3 Encounters:  05/29/16 192 lb (87.1 kg)  05/09/16 190 lb (86.2 kg)  05/04/16 193 lb (87.5 kg)    Lab Results  Component Value Date   HGBA1C 7.5 (H) 03/31/2016   HGBA1C 7.5 (H) 10/30/2015   HGBA1C 7.3 (H) 06/28/2015   Lab Results  Component Value Date   MICROALBUR 14.4 (H) 10/30/2015   LDLCALC 77 10/25/2015   CREATININE 1.00 03/31/2016    No visits with results within 1 Week(s) from this visit.  Latest known visit with results is:  Lab on 03/31/2016  Component Date Value Ref Range Status  . Hgb A1c MFr Bld 03/31/2016 7.5* 4.6 - 6.5 % Final  . Sodium 03/31/2016 140  135 - 145 mEq/L Final  . Potassium 03/31/2016 4.3  3.5 - 5.1 mEq/L Final  . Chloride 03/31/2016 109  96 - 112 mEq/L Final  . CO2 03/31/2016 26  19 - 32 mEq/L Final  . Glucose, Bld 03/31/2016 118* 70 - 99 mg/dL Final  . BUN 03/31/2016 22  6 - 23 mg/dL Final  . Creatinine, Ser 03/31/2016 1.00  0.40 - 1.20 mg/dL Final  . Calcium 03/31/2016 9.5  8.4 - 10.5 mg/dL Final  . GFR 03/31/2016 58.71* >60.00 mL/min Final      Medication List        Accurate as of 05/29/16  2:11 PM. Always use your most recent med list.          acetaminophen 325 MG tablet Commonly known as:  TYLENOL Take 650 mg by mouth every 6 (six) hours as needed for mild pain.   aspirin 81 MG tablet Take 81 mg by mouth QID. PT STATES SHE TAKES 3-4 ASA QD   B-D UF III MINI PEN NEEDLES 31G X 5 MM Misc Generic drug:  Insulin Pen Needle USE 5 TIMES A DAY AS DIRECTED   glucose blood test strip Commonly known as:  ONE TOUCH ULTRA TEST Use as instructed to check blood sugars 3 times per day dx code E11.65   HUMALOG KWIKPEN 100 UNIT/ML KiwkPen Generic drug:  insulin lispro INJECT 6 UNITS BEFORE BREAKFAST AND LUNCH AND 8 UNITS BEFORE SUPPER   insulin lispro 100 UNIT/ML injection Commonly known as:  HUMALOG 40 units max daily per vgo pump.   LANTUS SOLOSTAR 100 UNIT/ML Solostar Pen Generic drug:  Insulin Glargine INJECT 24 TO 26 UNITS SUBCUTANEOUSLY DAILY AT 10 PM   levothyroxine 100 MCG tablet Commonly known as:  SYNTHROID, LEVOTHROID take 1 tablet by mouth once daily   Liraglutide 18 MG/3ML Sopn Commonly known as:  VICTOZA INJECT 1.2 mg  SUBCUTANEOUSLY DAILY   losartan 100 MG tablet Commonly known as:  COZAAR Take 1 tablet (100 mg total) by mouth daily. Patient is overdue for an appt. Please call and schedule for further refills   nitroGLYCERIN 0.4 MG SL tablet Commonly known as:  NITROSTAT Place 1 tablet (0.4 mg total) under the tongue every 5 (five) minutes as needed for chest pain.   omega-3 acid ethyl esters 1 g capsule Commonly known as:  LOVAZA Take 2 g by mouth 2 (two) times daily.   ondansetron 4 MG tablet Commonly known as:  ZOFRAN Take 1 tablet (4 mg total) by mouth every 8 (eight) hours as needed for nausea or vomiting.   traMADol 50 MG tablet Commonly known as:  ULTRAM Take 1 tablet (50 mg total) by mouth every 6 (six) hours as needed.   V-GO 30 Kit Use 1 per day   VYTORIN 10-40 MG tablet Generic drug:   ezetimibe-simvastatin take 1 tablet by mouth once daily       Allergies:  Allergies  Allergen Reactions  . Actos [Pioglitazone]     hairloss  . Metformin And Related Diarrhea  . Novocain [Procaine]     syncopy  .  Sulfa Antibiotics Itching    Past Medical History:  Diagnosis Date  . Arthritis   . Complication of anesthesia    takes along time to wake up  . Contact lens/glasses fitting    wears glasses or contacts  . Coronary artery disease 01/2007   s/p PCI of diagonal   . Diabetes mellitus without complication ([place])   . Dyslipidemia   . Hyperlipidemia   . Hypertension   . Hypothyroidism   . Snores   . Vertigo     Past Surgical History:  Procedure Laterality Date  . CARDIAC CATHETERIZATION  2008   stent  . SHOULDER ARTHROSCOPY WITH OPEN ROTATOR CUFF REPAIR Right 02/28/2013   Procedure: right shoulder arthroscopy, distal clavicle sculpting, open rotator cuff repair;  Surgeon: Cammie Sickle., MD;  Location: [facility];  Service: Orthopedics;  Laterality: Right;    Family History  Problem Relation Age of Onset  . Hypertension Father   . Heart attack Father     Social History:  reports that she has never smoked. She has never used smokeless tobacco. She reports that she drinks alcohol. She reports that she does not use drugs.  Review of Systems:  HYPERTENSION:  followed by PCP/cardiologist, blood pressure Is controlled  HYPERLIPIDEMIA: The lipid abnormality consists of elevated LDL, taking Vytorin, followed by cardiologist.   She was tried on Crestor but she thinks this caused leg pain and is  on Vytorin   Lab Results  Component Value Date   CHOL 136 10/25/2015   HDL 38 (L) 10/25/2015   LDLCALC 77 10/25/2015   LDLDIRECT 121.2 05/08/2013   TRIG 107 10/25/2015   CHOLHDL 3.6 10/25/2015    History of mild hypothyroidism, previously treated by PCP  She has previously required progressively higher doses of levothyroxine.  She is  taking  100 g daily since 11/2014  No recent fatigue   Lab Results  Component Value Date   TSH 3.18 10/30/2015   TSH 2.17 03/05/2015   TSH 5.18 (H) 12/03/2014        Examination:   BP 130/82   Pulse 71   Temp 97.9 F (36.6 C)   Resp 16   Ht _0  (1.702 m)   Wt 192 lb (87.1 kg)   SpO2 95%   BMI 30.07 kg/m   Body mass index is 30.07 kg/m.     ASSESSMENT/ PLAN:   Diabetes type 2 With BMI 31  See history of present illness for  discussion of current management, blood sugar patterns and problems identified  Her blood sugars are looking better, however not clear if this is from adding Victoza or just taking more boluses on her pump She does not have her meter for download today.  For now we will increase her bolus at suppertime to 12 units and she can take only 2 units at bedtime  She can try to leave off Victoza for a week to see if sugars are any different Hopefully she will also do better with regular exercise    Patient Instructions  6 Clicks at supper, 1 at bedtime  ? No Victoza        Anella Nakata 05/29/2016, 2:11 PM

## 2016-05-30 ENCOUNTER — Other Ambulatory Visit: Payer: Self-pay | Admitting: Cardiology

## 2016-06-01 ENCOUNTER — Telehealth: Payer: Self-pay | Admitting: Endocrinology

## 2016-06-01 ENCOUNTER — Other Ambulatory Visit: Payer: Self-pay | Admitting: *Deleted

## 2016-06-01 MED ORDER — GLUCOSE BLOOD VI STRP: ORAL_STRIP | 3 refills | Status: DC

## 2016-06-01 NOTE — Telephone Encounter (Signed)
PT needs test strips for the Accu Check Aviva Plus sent to the [facility] on [address]

## 2016-06-01 NOTE — Telephone Encounter (Signed)
Rx sent 

## 2016-06-02 ENCOUNTER — Telehealth: Payer: Self-pay | Admitting: Endocrinology

## 2016-06-02 DIAGNOSIS — E119 Type 2 diabetes mellitus without complications: Secondary | ICD-10-CM | POA: Diagnosis not present

## 2016-06-02 NOTE — Telephone Encounter (Signed)
[facility] states they faxed paperwork to [place] regarding the test strips can we please contact the pharmacy to get this done ASAP she is completely out and has been out for 2 days

## 2016-06-02 NOTE — Telephone Encounter (Signed)
Spoke to patient and let her know we faxed prescription to pharmacy and they have contacted [place] about needing anything else I a... Patient dvised for her to [facility] and find out what else they need.. Patient stated she understood

## 2016-06-09 ENCOUNTER — Telehealth: Payer: Self-pay | Admitting: Endocrinology

## 2016-06-09 NOTE — Telephone Encounter (Signed)
Vaughan Basta,  Can you please call her to answer her questions?  Thanks

## 2016-06-09 NOTE — Telephone Encounter (Signed)
Pt requests call back, she said she has some questions regarding her VGo.

## 2016-06-15 ENCOUNTER — Other Ambulatory Visit: Payer: Self-pay | Admitting: Endocrinology

## 2016-06-29 ENCOUNTER — Other Ambulatory Visit: Payer: Self-pay | Admitting: Cardiology

## 2016-07-07 DIAGNOSIS — Z23 Encounter for immunization: Secondary | ICD-10-CM | POA: Diagnosis not present

## 2016-07-16 ENCOUNTER — Other Ambulatory Visit: Payer: Self-pay | Admitting: Cardiology

## 2016-07-16 ENCOUNTER — Other Ambulatory Visit: Payer: Self-pay

## 2016-07-16 MED ORDER — LOSARTAN POTASSIUM 100 MG PO TABS: 100.0000 mg | ORAL_TABLET | Freq: Every day | ORAL | 0 refills | Status: DC

## 2016-07-16 MED ORDER — LOSARTAN POTASSIUM 100 MG PO TABS
100.0000 mg | ORAL_TABLET | Freq: Every day | ORAL | 0 refills | Status: DC
Start: 2016-07-16 — End: 2016-07-16

## 2016-07-16 NOTE — Telephone Encounter (Signed)
New Message   *STAT* If patient is at the pharmacy, call can be transferred to refill team.   1. Which medications need to be refilled? (please list name of each medication and dose if known)  losartan 100 mg tablet 4 mg by mouth every 8 hours as needed.  2. Which pharmacy/location (including street and city if local pharmacy) is medication to be sent to? [facility], [address] [place], [place]  3. Do they need a 30 day or 90 day supply?  90 day supply

## 2016-07-21 ENCOUNTER — Other Ambulatory Visit (INDEPENDENT_AMBULATORY_CARE_PROVIDER_SITE_OTHER): Payer: Medicare Other

## 2016-07-21 DIAGNOSIS — E1165 Type 2 diabetes mellitus with hyperglycemia: Secondary | ICD-10-CM

## 2016-07-21 DIAGNOSIS — Z794 Long term (current) use of insulin: Secondary | ICD-10-CM

## 2016-07-21 LAB — TSH: TSH: 3.26 u[IU]/mL (ref 0.35–4.50)

## 2016-07-21 LAB — COMPREHENSIVE METABOLIC PANEL
ALBUMIN: 4.2 g/dL (ref 3.5–5.2)
ALT: 23 U/L (ref 0–35)
AST: 20 U/L (ref 0–37)
Alkaline Phosphatase: 86 U/L (ref 39–117)
BILIRUBIN TOTAL: 0.4 mg/dL (ref 0.2–1.2)
BUN: 21 mg/dL (ref 6–23)
CALCIUM: 9.9 mg/dL (ref 8.4–10.5)
CO2: 28 mEq/L (ref 19–32)
CREATININE: 1 mg/dL (ref 0.40–1.20)
Chloride: 106 mEq/L (ref 96–112)
GFR: 58.65 mL/min — ABNORMAL LOW (ref >60.00)
Glucose, Bld: 102 mg/dL — ABNORMAL HIGH (ref 70–99)
Potassium: 4.2 mEq/L (ref 3.5–5.1)
SODIUM: 140 meq/L (ref 135–145)
Total Protein: 7.4 g/dL (ref 6.0–8.3)

## 2016-07-21 LAB — MICROALBUMIN / CREATININE URINE RATIO
Creatinine,U: 197.4 mg/dL
MICROALB/CREAT RATIO: 45 mg/g — AB (ref 0.0–30.0)
Microalb, Ur: 88.9 mg/dL — ABNORMAL HIGH (ref 0.0–1.9)

## 2016-07-21 LAB — T4, FREE: FREE T4: 1.08 ng/dL (ref 0.60–1.60)

## 2016-07-21 LAB — HEMOGLOBIN A1C: Hgb A1c MFr Bld: 7.1 % — ABNORMAL HIGH (ref 4.6–6.5)

## 2016-07-24 ENCOUNTER — Encounter: Payer: Self-pay | Admitting: Endocrinology

## 2016-07-24 ENCOUNTER — Other Ambulatory Visit: Payer: Self-pay

## 2016-07-24 ENCOUNTER — Ambulatory Visit (INDEPENDENT_AMBULATORY_CARE_PROVIDER_SITE_OTHER): Payer: Medicare Other | Admitting: Endocrinology

## 2016-07-24 VITALS — BP 148/98 | HR 77 | Ht 67.0 in | Wt 194.0 lb

## 2016-07-24 DIAGNOSIS — E038 Other specified hypothyroidism: Secondary | ICD-10-CM | POA: Diagnosis not present

## 2016-07-24 DIAGNOSIS — E063 Autoimmune thyroiditis: Secondary | ICD-10-CM

## 2016-07-24 DIAGNOSIS — E1165 Type 2 diabetes mellitus with hyperglycemia: Secondary | ICD-10-CM | POA: Diagnosis not present

## 2016-07-24 DIAGNOSIS — R809 Proteinuria, unspecified: Secondary | ICD-10-CM

## 2016-07-24 DIAGNOSIS — E1129 Type 2 diabetes mellitus with other diabetic kidney complication: Secondary | ICD-10-CM

## 2016-07-24 DIAGNOSIS — Z794 Long term (current) use of insulin: Secondary | ICD-10-CM

## 2016-07-24 MED ORDER — INSULIN LISPRO 100 UNIT/ML ~~LOC~~ SOLN: SUBCUTANEOUS | 11 refills | Status: DC

## 2016-07-24 NOTE — Patient Instructions (Signed)
More readings at nite, < 180 target

## 2016-07-24 NOTE — Progress Notes (Signed)
Patient ID: Crystal Hobbs, female   DOB: 17-Jun-1951, 65 y.o.   MRN: [id number]   Reason for Appointment: Diabetes follow-up   History of Present Illness   Diagnosis: Type 2 DIABETES MELITUS, date of diagnosis:  2011   Past history: Her initial presentation with diabetes was with significant symptoms and glucose of 947 Initially was treated with insulin and subsequently on metformin which she could not tolerate  Since she was not responding to Victoza and had significant hyperglycemia she was started on basal bolus insulin regimen instead  However had required relatively large doses of insulin with inadequate control and blood sugars subsequently improved significantly with adding Victoza in 10/13  Her  A1c was 6.4 in 4/14 and she had overall good control with minimal hypoglycemia She was taken off  Afrezza because of her high out-of-pocket expense and she was tried on V-go pump which she had some difficulties getting this through the pharmacy  RECENT history:    Insulin regimen: V-go 30 units Humalog boluses 04-21-09 units, 3 times a day Non-insulin hypoglycemic drugs: Victoza 1.2 mg daily   She is on a regimen of basal bolus insulin regimen, using the V-go pump since 04/08/16  Her A1c has improved at 7.1, previously has been 7.5   Current blood sugar patterns and problems identified:  Although her A1c is better her blood sugars at home are recently significantly high and averaging about 178  She thinks that most of her sugars are high because of stress  FASTING blood sugars at home are mostly high although late morning in the lab glucose was only 102  She also appears to have persistently high readings after supper, checking only a few readings towards bedtime.  She is concerned that her insurance is not covering her Humalog well since she is using up more than what her prescription is written for  She continues to like using the V-go which has helped her compliance with  mealtime insulin boluses  VICTOZA: She had been told to try leaving this off on her last visit but recently she has gone back on it since she thinks blood sugars may have been higher without it.  She had been concerned about the cost of this previously  She is using a new monitor and the date and time are incorrect  Side effects from medications:  diarrhea from metformin,? Hair loss from Actos Proper timing of medications in relation to meals: Yes         Monitors blood glucose: Recently 1-2 times  a day.    Glucometer: One Touch ultra mini .          Blood Glucose readings from monitor   Mean values apply above for all meters except median for One Touch  PRE-MEAL Fasting Lunch Dinner Bedtime Overall  Glucose range: 89-328  140- 156  159-178  146-296    Mean/median: 165    257  166     Hypoglycemia: None recently      Meals: 3 meals per day.  Breakfast: may have half a peanut better sandwich Or a granola bar.   Dinner at 6-8 pm, Recently dinner is a smaller meal than lunch          Physical activity: exercise: doing some walking and is trying to play tennis 2/7 days a week            Diabetes education visit: Most recent: 05/2012      CDE visit 10/17  Wt Readings from Last 3 Encounters:  07/24/16 194 lb (88 kg)  05/29/16 192 lb (87.1 kg)  05/09/16 190 lb (86.2 kg)    Lab Results  Component Value Date   HGBA1C 7.1 (H) 07/21/2016   HGBA1C 7.5 (H) 03/31/2016   HGBA1C 7.5 (H) 10/30/2015   Lab Results  Component Value Date   MICROALBUR 88.9 (H) 07/21/2016   LDLCALC 77 10/25/2015   CREATININE 1.00 07/21/2016    Other problems discussed today: See review of systems   Lab on 07/21/2016  Component Date Value Ref Range Status  . Hgb A1c MFr Bld 07/21/2016 7.1* 4.6 - 6.5 % Final  . Sodium 07/21/2016 140  135 - 145 mEq/L Final  . Potassium 07/21/2016 4.2  3.5 - 5.1 mEq/L Final  . Chloride 07/21/2016 106  96 - 112 mEq/L Final  . CO2 07/21/2016 28  19 - 32 mEq/L Final    . Glucose, Bld 07/21/2016 102* 70 - 99 mg/dL Final  . BUN 07/21/2016 21  6 - 23 mg/dL Final  . Creatinine, Ser 07/21/2016 1.00  0.40 - 1.20 mg/dL Final  . Total Bilirubin 07/21/2016 0.4  0.2 - 1.2 mg/dL Final  . Alkaline Phosphatase 07/21/2016 86  39 - 117 U/L Final  . AST 07/21/2016 20  0 - 37 U/L Final  . ALT 07/21/2016 23  0 - 35 U/L Final  . Total Protein 07/21/2016 7.4  6.0 - 8.3 g/dL Final  . Albumin 07/21/2016 4.2  3.5 - 5.2 g/dL Final  . Calcium 07/21/2016 9.9  8.4 - 10.5 mg/dL Final  . GFR 07/21/2016 58.65* >60.00 mL/min Final  . TSH 07/21/2016 3.26  0.35 - 4.50 uIU/mL Final  . Free T4 07/21/2016 1.08  0.60 - 1.60 ng/dL Final   Comment: Specimens from patients who are undergoing biotin therapy and /or ingesting biotin supplements may contain high levels of biotin.  The higher biotin concentration in these specimens interferes with this Free T4 assay.  Specimens that contain high levels  of biotin may cause false high results for this Free T4 assay.  Please interpret results in light of the total clinical presentation of the patient.    Jacquelyne Balint, Ur 07/21/2016 88.9* 0.0 - 1.9 mg/dL Final  . Creatinine,U 07/21/2016 197.4  mg/dL Final  . Microalb Creat Ratio 07/21/2016 45.0* 0.0 - 30.0 mg/g Final      Medication List       Accurate as of 07/24/16 11:59 PM. Always use your most recent med list.          acetaminophen 325 MG tablet Commonly known as:  TYLENOL Take 650 mg by mouth every 6 (six) hours as needed for mild pain.   aspirin 81 MG tablet Take 81 mg by mouth QID. PT STATES SHE TAKES 3-4 ASA QD   B-D UF III MINI PEN NEEDLES 31G X 5 MM Misc Generic drug:  Insulin Pen Needle USE 5 TIMES A DAY AS DIRECTED   glucose blood test strip Commonly known as:  ACCU-CHEK AVIVA PLUS Use as instructed to check blood sugar 3 times per day dx code E11.65   insulin lispro 100 UNIT/ML injection Commonly known as:  HUMALOG Use 66 units in V-go pump daily   LANTUS SOLOSTAR  100 UNIT/ML Solostar Pen Generic drug:  Insulin Glargine INJECT 24 TO 26 UNITS SUBCUTANEOUSLY DAILY AT 10 PM   levothyroxine 100 MCG tablet Commonly known as:  SYNTHROID, LEVOTHROID take 1 tablet by mouth once daily   liraglutide 18  MG/3ML Sopn Commonly known as:  VICTOZA INJECT 1.2 mg  SUBCUTANEOUSLY DAILY   losartan 100 MG tablet Commonly known as:  COZAAR Take 1 tablet (100 mg total) by mouth daily. Please keep 09/23/16 appt for further refills   nitroGLYCERIN 0.4 MG SL tablet Commonly known as:  NITROSTAT Place 1 tablet (0.4 mg total) under the tongue every 5 (five) minutes as needed for chest pain.   omega-3 acid ethyl esters 1 g capsule Commonly known as:  LOVAZA Take 2 g by mouth 2 (two) times daily.   ondansetron 4 MG tablet Commonly known as:  ZOFRAN Take 1 tablet (4 mg total) by mouth every 8 (eight) hours as needed for nausea or vomiting.   traMADol 50 MG tablet Commonly known as:  ULTRAM Take 1 tablet (50 mg total) by mouth every 6 (six) hours as needed.   V-GO 30 Kit Use 1 per day   VYTORIN 10-40 MG tablet Generic drug:  ezetimibe-simvastatin take 1 tablet by mouth once daily       Allergies:  Allergies  Allergen Reactions  . Actos [Pioglitazone]     hairloss  . Metformin And Related Diarrhea  . Novocain [Procaine]     syncopy  . Sulfa Antibiotics Itching    Past Medical History:  Diagnosis Date  . Arthritis   . Complication of anesthesia    takes along time to wake up  . Contact lens/glasses fitting    wears glasses or contacts  . Coronary artery disease 01/2007   s/p PCI of diagonal   . Diabetes mellitus without complication ([place])   . Dyslipidemia   . Hyperlipidemia   . Hypertension   . Hypothyroidism   . Snores   . Vertigo     Past Surgical History:  Procedure Laterality Date  . CARDIAC CATHETERIZATION  2008   stent  . SHOULDER ARTHROSCOPY WITH OPEN ROTATOR CUFF REPAIR Right 02/28/2013   Procedure: right shoulder arthroscopy,  distal clavicle sculpting, open rotator cuff repair;  Surgeon: Cammie Sickle., MD;  Location: [facility];  Service: Orthopedics;  Laterality: Right;    Family History  Problem Relation Age of Onset  . Hypertension Father   . Heart attack Father     Social History:  reports that she has never smoked. She has never used smokeless tobacco. She reports that she drinks alcohol. She reports that she does not use drugs.  Review of Systems:  HYPERTENSION:  followed by PCP/cardiologist, blood pressure Is usually controlled on losartan 100 mg, higher today because of stress  HYPERLIPIDEMIA: The lipid abnormality consists of elevated LDL, taking Vytorin, followed by cardiologist.   She was tried on Crestor but she thinks this caused leg pain and is again on Vytorin   Lab Results  Component Value Date   CHOL 136 10/25/2015   HDL 38 (L) 10/25/2015   LDLCALC 77 10/25/2015   LDLDIRECT 121.2 05/08/2013   TRIG 107 10/25/2015   CHOLHDL 3.6 10/25/2015    History of mild hypothyroidism, previously treated by PCP  She has previously required progressively higher doses of levothyroxine.  She is  taking 100 g daily since 11/2014  Hobbs recent fatigue  TSH recently consistently normal  Lab Results  Component Value Date   TSH 3.26 07/21/2016   TSH 3.18 10/30/2015   TSH 2.17 03/05/2015        Examination:   BP (!) 148/98   Pulse 77   Ht 5' 7"  (1.702 m)   Wt  194 lb (88 kg)   SpO2 95%   BMI 30.38 kg/m   Body mass index is 30.38 kg/m.     ASSESSMENT/ PLAN:   Diabetes type 2 With BMI 31  See history of present illness for  discussion of current management, blood sugar patterns and problems identified  She has had difficulty getting consistent control of her diabetes However her A1c is 7.1 Most likely her improved control is related to better compliance with mealtime boluses using the V-go pump compared to Humalog injections. She also has started taking Victoza  again which seems to be helping both fasting and some postprandial readings However her main difficulty is not getting readings controlled after supper and likely is not getting adequate insulin despite eating relatively smaller meals in the evenings She is still trying to exercise and keeping her weight stable, encouraged her to continue aerobic activity  Discussed needing to check blood sugars more often after supper to help adjust mealtime insulin Does not need to check blood sugar every single morning She needs to take at least 12-14 units bolus at suppertime to get readings at least under 180 at bedtime She will keep her 30 unit basal on the pump since recent fasting readings are coming down and may be better also with keeping blood sugars better later at night Encourage her to watch carbohydrates at all times Will change her Humalog prescription reflecting use of the full amount in the V-go pump daily of 66 units Sample of the insulin pumps given until next prescription is covered  LIPIDS: Needs follow-up  HYPOTHYROID: Adequately replaced 100 g of levothyroxine and will continue  INCREASED microalbumin: This is mildly increased and blood pressure may not be adequately controlled, will forward this to PCP and cardiologist  Patient Instructions  More readings at nite, < 180 target   Counseling time on subjects discussed above is over 50% of today's 25 minute visit  Mayci Haning 07/25/2016, 2:59 PM

## 2016-08-28 ENCOUNTER — Other Ambulatory Visit: Payer: Self-pay | Admitting: Cardiology

## 2016-09-02 ENCOUNTER — Encounter: Payer: Self-pay | Admitting: *Deleted

## 2016-09-23 ENCOUNTER — Encounter: Payer: Self-pay | Admitting: Cardiology

## 2016-09-23 ENCOUNTER — Ambulatory Visit (INDEPENDENT_AMBULATORY_CARE_PROVIDER_SITE_OTHER): Payer: Medicare Other | Admitting: Cardiology

## 2016-09-23 VITALS — BP 146/84 | HR 70 | Ht 66.0 in | Wt 197.0 lb

## 2016-09-23 DIAGNOSIS — E7800 Pure hypercholesterolemia, unspecified: Secondary | ICD-10-CM | POA: Diagnosis not present

## 2016-09-23 DIAGNOSIS — I10 Essential (primary) hypertension: Secondary | ICD-10-CM | POA: Diagnosis not present

## 2016-09-23 DIAGNOSIS — R011 Cardiac murmur, unspecified: Secondary | ICD-10-CM | POA: Diagnosis not present

## 2016-09-23 DIAGNOSIS — I2510 Atherosclerotic heart disease of native coronary artery without angina pectoris: Secondary | ICD-10-CM

## 2016-09-23 NOTE — Patient Instructions (Signed)
Medication Instructions:  Your physician recommends that you continue on your current medications as directed. Please refer to the Current Medication list given to you today.   Labwork: Your physician recommends that you return for FASTING lab work the same day as your ECHO.  Testing/Procedures: Your physician has requested that you have an echocardiogram. Echocardiography is a painless test that uses sound waves to create images of your heart. It provides your doctor with information about the size and shape of your heart and how well your heart's chambers and valves are working. This procedure takes approximately one hour. There are no restrictions for this procedure.  Follow-Up: Your physician wants you to follow-up in: 1 year with Dr. Radford Pax. You will receive a reminder letter in the mail two months in advance. If you don't receive a letter, please call our office to schedule the follow-up appointment.   Any Other Special Instructions Will Be Listed Below (If Applicable).     If you need a refill on your cardiac medications before your next appointment, please call your pharmacy.

## 2016-09-23 NOTE — Progress Notes (Signed)
Cardiology Office Note    Date:  09/23/2016   ID:  TAILOR LUCKING, DOB 1951/01/18, MRN [id number]  PCP:  Reginia Naas, MD  Cardiologist:  Fransico Him, MD   Chief Complaint  Patient presents with  . Coronary Artery Disease  . Hyperlipidemia    History of Present Illness:  Crystal Hobbs is a 66 y.o. female with a history of ASCAD with BMS to D1 in 2008, HTN and dyslipidemia who presents today for followup. She currently  denies any chest pain, SOB or DOE.  She denies any  dizziness, palpitations or syncope. Her LE edema has essentially resolved with losing weight.  She has not been playing much tennis because she had a fall recently and has gotten out of shape.  She walks a lot without any problems.     Past Medical History:  Diagnosis Date  . Arthritis   . Complication of anesthesia    takes along time to wake up  . Contact lens/glasses fitting    wears glasses or contacts  . Coronary artery disease 01/2007   s/p PCI of diagonal   . Diabetes mellitus without complication ([place])   . Dyslipidemia   . Hyperlipidemia   . Hypertension   . Hypothyroidism   . Snores   . Vertigo     Past Surgical History:  Procedure Laterality Date  . CARDIAC CATHETERIZATION  2008   stent  . SHOULDER ARTHROSCOPY WITH OPEN ROTATOR CUFF REPAIR Right 02/28/2013   Procedure: right shoulder arthroscopy, distal clavicle sculpting, open rotator cuff repair;  Surgeon: Cammie Sickle., MD;  Location: [facility];  Service: Orthopedics;  Laterality: Right;    Current Medications: Outpatient Medications Prior to Visit  Medication Sig Dispense Refill  . acetaminophen (TYLENOL) 325 MG tablet Take 650 mg by mouth every 6 (six) hours as needed for mild pain.     Marland Kitchen aspirin 81 MG tablet Take 81 mg by mouth QID. PT STATES SHE TAKES 3-4 ASA QD    . B-D UF III MINI PEN NEEDLES 31G X 5 MM MISC USE 5 TIMES A DAY AS DIRECTED 200 each 3  . ezetimibe-simvastatin (VYTORIN) 10-40 MG tablet  Take 1 tablet by mouth daily. *Please keep upcoming appointment for further refills* 30 tablet 0  . glucose blood (ACCU-CHEK AVIVA PLUS) test strip Use as instructed to check blood sugar 3 times per day dx code E11.65 100 each 3  . Insulin Disposable Pump (V-GO 30) KIT Use 1 per day 30 kit 1  . insulin lispro (HUMALOG) 100 UNIT/ML injection Use 66 units in V-go pump daily 10 mL 11  . levothyroxine (SYNTHROID, LEVOTHROID) 100 MCG tablet take 1 tablet by mouth once daily 90 tablet 1  . Liraglutide (VICTOZA) 18 MG/3ML SOPN INJECT 1.2 mg  SUBCUTANEOUSLY DAILY 27 mL 1  . losartan (COZAAR) 100 MG tablet Take 1 tablet (100 mg total) by mouth daily. Please keep 09/23/16 appt for further refills 90 tablet 0  . nitroGLYCERIN (NITROSTAT) 0.4 MG SL tablet Place 1 tablet (0.4 mg total) under the tongue every 5 (five) minutes as needed for chest pain. 90 tablet 3  . omega-3 acid ethyl esters (LOVAZA) 1 G capsule Take 2 g by mouth 2 (two) times daily.    . ondansetron (ZOFRAN) 4 MG tablet Take 1 tablet (4 mg total) by mouth every 8 (eight) hours as needed for nausea or vomiting. 20 tablet 0  . traMADol (ULTRAM) 50 MG tablet Take 1  tablet (50 mg total) by mouth every 6 (six) hours as needed. 15 tablet 0  . LANTUS SOLOSTAR 100 UNIT/ML Solostar Pen INJECT 24 TO 26 UNITS SUBCUTANEOUSLY DAILY AT 10 PM 15 mL 0   No facility-administered medications prior to visit.      Allergies:   Actos [pioglitazone]; Metformin and related; Novocain [procaine]; and Sulfa antibiotics   Social History   Social History  . Marital status: Married    Spouse name: N/A  . Number of children: N/A  . Years of education: N/A   Social History Main Topics  . Smoking status: Never Smoker  . Smokeless tobacco: Never Used  . Alcohol use Yes     Comment: occ  . Drug use: No  . Sexual activity: Not Asked   Other Topics Concern  . None   Social History Narrative  . None     Family History:  The patient's family history includes  Heart attack in her father; Hypertension in her father.   ROS:   Please see the history of present illness.    ROS All other systems reviewed and are negative.  No flowsheet data found.     PHYSICAL EXAM:   VS:  BP (!) 146/84   Pulse 70   Ht 5' 6" (1.676 m)   Wt 197 lb (89.4 kg)   SpO2 98%   BMI 31.80 kg/m    GEN: Well nourished, well developed, in no acute distress  HEENT: normal  Neck: no JVD, carotid bruits, or masses Cardiac: RRR; no rubs, or gallops,no edema.  Intact distal pulses bilaterally. 1/6 SM at RUSB Respiratory:  clear to auscultation bilaterally, normal work of breathing GI: soft, nontender, nondistended, + BS MS: no deformity or atrophy  Skin: warm and dry, no rash Neuro:  Alert and Oriented x 3, Strength and sensation are intact Psych: euthymic mood, full affect  Wt Readings from Last 3 Encounters:  09/23/16 197 lb (89.4 kg)  07/24/16 194 lb (88 kg)  05/29/16 192 lb (87.1 kg)      Studies/Labs Reviewed:   EKG:  EKG is not ordered today.    Recent Labs: 07/21/2016: ALT 23; BUN 21; Creatinine, Ser 1.00; Potassium 4.2; Sodium 140; TSH 3.26   Lipid Panel    Component Value Date/Time   CHOL 136 10/25/2015 0753   TRIG 107 10/25/2015 0753   HDL 38 (L) 10/25/2015 0753   CHOLHDL 3.6 10/25/2015 0753   VLDL 21 10/25/2015 0753   LDLCALC 77 10/25/2015 0753   LDLDIRECT 121.2 05/08/2013 0852    Additional studies/ records that were reviewed today include:  none    ASSESSMENT:    1. Coronary artery disease involving native coronary artery of native heart without angina pectoris   2. Essential hypertension   3. Pure hypercholesterolemia   4. Heart murmur      PLAN:  In order of problems listed above:  1. ASCAD s/p BMS to D1 in 2008.  Continue ASA and statin. 2. HTN - BP borderline controlled on current meds.  Continue ARB.  Renal function stable.  I have asked her to check her BP several times in the next week and call with the results.   3. Hyperlipidemia - LDL goal < 70.  Continue statin. Check FLP and ALT.  4. Heart murmur - she has a faint systolic murmur over the AV and I will get an echo to assess this.    Medication Adjustments/Labs and Tests Ordered: Current medicines are reviewed at  length with the patient today.  Concerns regarding medicines are outlined above.  Medication changes, Labs and Tests ordered today are listed in the Patient Instructions below.  There are no Patient Instructions on file for this visit.   Signed, Fransico Him, MD  09/23/2016 10:45 AM    [facility] [address], [place], [place]  [address] Phone: [phone]; Fax: [phone]

## 2016-09-26 ENCOUNTER — Other Ambulatory Visit: Payer: Self-pay | Admitting: Endocrinology

## 2016-10-06 ENCOUNTER — Ambulatory Visit (HOSPITAL_COMMUNITY): Payer: Medicare Other | Attending: Internal Medicine

## 2016-10-06 ENCOUNTER — Other Ambulatory Visit: Payer: Medicare Other | Admitting: *Deleted

## 2016-10-06 ENCOUNTER — Other Ambulatory Visit: Payer: Self-pay

## 2016-10-06 DIAGNOSIS — R011 Cardiac murmur, unspecified: Secondary | ICD-10-CM

## 2016-10-06 DIAGNOSIS — E7800 Pure hypercholesterolemia, unspecified: Secondary | ICD-10-CM

## 2016-10-07 LAB — HEPATIC FUNCTION PANEL
ALBUMIN: 4.1 g/dL (ref 3.6–4.8)
ALT: 28 IU/L (ref 0–32)
AST: 21 IU/L (ref 0–40)
Alkaline Phosphatase: 92 IU/L (ref 39–117)
BILIRUBIN, DIRECT: 0.12 mg/dL (ref 0.00–0.40)
Bilirubin Total: 0.4 mg/dL (ref 0.0–1.2)
TOTAL PROTEIN: 6.8 g/dL (ref 6.0–8.5)

## 2016-10-07 LAB — LIPID PANEL
CHOL/HDL RATIO: 3.4 ratio (ref 0.0–4.4)
Cholesterol, Total: 135 mg/dL (ref 100–199)
HDL: 40 mg/dL (ref >39)
LDL CALC: 63 mg/dL (ref 0–99)
Triglycerides: 158 mg/dL — ABNORMAL HIGH (ref 0–149)
VLDL CHOLESTEROL CAL: 32 mg/dL (ref 5–40)

## 2016-10-20 ENCOUNTER — Other Ambulatory Visit (INDEPENDENT_AMBULATORY_CARE_PROVIDER_SITE_OTHER): Payer: Medicare Other

## 2016-10-20 ENCOUNTER — Other Ambulatory Visit: Payer: Self-pay | Admitting: Cardiology

## 2016-10-20 DIAGNOSIS — E1165 Type 2 diabetes mellitus with hyperglycemia: Secondary | ICD-10-CM

## 2016-10-20 DIAGNOSIS — Z794 Long term (current) use of insulin: Secondary | ICD-10-CM

## 2016-10-20 LAB — HEMOGLOBIN A1C: HEMOGLOBIN A1C: 7.4 % — AB (ref 4.6–6.5)

## 2016-10-20 LAB — MICROALBUMIN / CREATININE URINE RATIO
Creatinine,U: 129.4 mg/dL
MICROALB UR: 90.1 mg/dL — AB (ref 0.0–1.9)
MICROALB/CREAT RATIO: 69.6 mg/g — AB (ref 0.0–30.0)

## 2016-10-20 LAB — BASIC METABOLIC PANEL
BUN: 22 mg/dL (ref 6–23)
CO2: 26 mEq/L (ref 19–32)
Calcium: 10 mg/dL (ref 8.4–10.5)
Chloride: 108 mEq/L (ref 96–112)
Creatinine, Ser: 1.02 mg/dL (ref 0.40–1.20)
GFR: 57.29 mL/min — AB (ref >60.00)
Glucose, Bld: 148 mg/dL — ABNORMAL HIGH (ref 70–99)
POTASSIUM: 4 meq/L (ref 3.5–5.1)
SODIUM: 139 meq/L (ref 135–145)

## 2016-10-23 ENCOUNTER — Encounter: Payer: Self-pay | Admitting: Endocrinology

## 2016-10-23 ENCOUNTER — Ambulatory Visit (INDEPENDENT_AMBULATORY_CARE_PROVIDER_SITE_OTHER): Payer: Medicare Other | Admitting: Endocrinology

## 2016-10-23 VITALS — BP 156/88 | HR 64 | Ht 66.0 in | Wt 195.0 lb

## 2016-10-23 DIAGNOSIS — E1165 Type 2 diabetes mellitus with hyperglycemia: Secondary | ICD-10-CM

## 2016-10-23 DIAGNOSIS — R809 Proteinuria, unspecified: Secondary | ICD-10-CM

## 2016-10-23 DIAGNOSIS — E1142 Type 2 diabetes mellitus with diabetic polyneuropathy: Secondary | ICD-10-CM | POA: Diagnosis not present

## 2016-10-23 DIAGNOSIS — I10 Essential (primary) hypertension: Secondary | ICD-10-CM

## 2016-10-23 DIAGNOSIS — E1129 Type 2 diabetes mellitus with other diabetic kidney complication: Secondary | ICD-10-CM | POA: Diagnosis not present

## 2016-10-23 DIAGNOSIS — Z794 Long term (current) use of insulin: Secondary | ICD-10-CM

## 2016-10-23 NOTE — Patient Instructions (Addendum)
Check blood sugars on waking up  4x per weekly  Also check blood sugars about 2 hours after a meal and do this after different meals by rotation  Recommended blood sugar levels on waking up is 90-130 and about 2 hours after meal is 130-160  Please bring your blood sugar monitor to each visit, thank you  40 unit pump; try 3 clicks in am, may need less at dinner

## 2016-10-23 NOTE — Progress Notes (Signed)
Patient ID: Crystal Hobbs, female   DOB: 02-Sep-1951, 66 y.o.   MRN: [id number]   Reason for Appointment: Diabetes follow-up   History of Present Illness   Diagnosis: Type 2 DIABETES MELITUS, date of diagnosis:  2011   Past history: Her initial presentation with diabetes was with significant symptoms and glucose of 947 Initially was treated with insulin and subsequently on metformin which she could not tolerate  Since she was not responding to Victoza and had significant hyperglycemia she was started on basal bolus insulin regimen instead  However had required relatively large doses of insulin with inadequate control and blood sugars subsequently improved significantly with adding Victoza in 10/13  Her  A1c was 6.4 in 4/14 and she had overall good control with minimal hypoglycemia She was taken off  Afrezza because of her high out-of-pocket expense and she was tried on V-go pump which she had some difficulties getting this through the pharmacy  RECENT history:    Insulin regimen: V-go 30 units Humalog boluses 04-21-13 units, 3 times a day Non-insulin hypoglycemic drugs: Victoza not being taken   She is on a regimen of basal bolus insulin regimen, using the V-go pump since 04/08/16  Her A1c has gone up relatively up to 7.4, previously was improved at 7.1  Current blood sugar patterns and problems identified:  She was told to increase her boluses at suppertime 14 units instead of 10  With this her readings at night are mostly better although she is checking infrequently  However her fasting readings are fairly consistently high except once  She has again stopped her Victoza and she thinks it was not helping her control  Her weight is about the same  She is still trying to exercise as much as possible  Has sporadic readings during the daytime and occasionally higher in the afternoon  She thinks she had lower readings when out of down because of eating light  breakfast  Does not adjust her mealtime clicks based on her meal size of carbohydrate  Side effects from medications:  diarrhea from metformin,? Hair loss from Actos Proper timing of medications in relation to meals: Yes         Monitors blood glucose: Recently 1-2 times  a day.    Glucometer: One Touch ultra mini .          Blood Glucose readings from monitor   Mean values apply above for all meters except median for One Touch  PRE-MEAL Fasting Lunch Dinner Bedtime Overall  Glucose range: 81-206   118-191  103-164    Mean/median: 160   140  51    Hypoglycemia: None recently      Meals: 3 meals per day.  Breakfast: may have half a peanut better sandwich Or a granola bar.   Dinner at 6-8 pm, Recently dinner is a smaller meal than lunch          Physical activity: exercise: doing walking and is trying to play tennis 2/7 days a week            Diabetes education visit: Most recent: 05/2012      CDE visit 10/17         Wt Readings from Last 3 Encounters:  10/23/16 195 lb (88.5 kg)  09/23/16 197 lb (89.4 kg)  07/24/16 194 lb (88 kg)    Lab Results  Component Value Date   HGBA1C 7.4 (H) 10/20/2016   HGBA1C 7.1 (H) 07/21/2016   HGBA1C  7.5 (H) 03/31/2016   Lab Results  Component Value Date   MICROALBUR 90.1 (H) 10/20/2016   LDLCALC 63 10/06/2016   CREATININE 1.02 10/20/2016    Other problems discussed today: See review of systems   Lab on 10/20/2016  Component Date Value Ref Range Status  . Hgb A1c MFr Bld 10/20/2016 7.4* 4.6 - 6.5 % Final  . Sodium 10/20/2016 139  135 - 145 mEq/L Final  . Potassium 10/20/2016 4.0  3.5 - 5.1 mEq/L Final  . Chloride 10/20/2016 108  96 - 112 mEq/L Final  . CO2 10/20/2016 26  19 - 32 mEq/L Final  . Glucose, Bld 10/20/2016 148* 70 - 99 mg/dL Final  . BUN 10/20/2016 22  6 - 23 mg/dL Final  . Creatinine, Ser 10/20/2016 1.02  0.40 - 1.20 mg/dL Final  . Calcium 10/20/2016 10.0  8.4 - 10.5 mg/dL Final  . GFR 10/20/2016 57.29* >60.00 mL/min  Final  . Microalb, Ur 10/20/2016 90.1* 0.0 - 1.9 mg/dL Final  . Creatinine,U 10/20/2016 129.4  mg/dL Final  . Microalb Creat Ratio 10/20/2016 69.6* 0.0 - 30.0 mg/g Final    Allergies as of 10/23/2016      Reactions   Actos [pioglitazone]    hairloss   Metformin And Related Diarrhea   Novocain [procaine]    syncopy   Sulfa Antibiotics Itching      Medication List       Accurate as of 10/23/16 10:50 AM. Always use your most recent med list.          acetaminophen 325 MG tablet Commonly known as:  TYLENOL Take 650 mg by mouth every 6 (six) hours as needed for mild pain.   aspirin 81 MG tablet Take 81 mg by mouth QID. PT STATES SHE TAKES 3-4 ASA QD   B-D UF III MINI PEN NEEDLES 31G X 5 MM Misc Generic drug:  Insulin Pen Needle USE 5 TIMES A DAY AS DIRECTED   Biotin 5000 MCG Caps Take 1 capsule by mouth 2 (two) times daily.   ezetimibe-simvastatin 10-40 MG tablet Commonly known as:  VYTORIN TAKE 1 TABLET BY MOUTH DAILY   glucose blood test strip Commonly known as:  ACCU-CHEK AVIVA PLUS Use as instructed to check blood sugar 3 times per day dx code E11.65   insulin lispro 100 UNIT/ML injection Commonly known as:  HUMALOG Use 66 units in V-go pump daily   levothyroxine 100 MCG tablet Commonly known as:  SYNTHROID, LEVOTHROID take 1 tablet by mouth once daily   losartan 100 MG tablet Commonly known as:  COZAAR Take 1 tablet (100 mg total) by mouth daily. Please keep 09/23/16 appt for further refills   nitroGLYCERIN 0.4 MG SL tablet Commonly known as:  NITROSTAT Place 1 tablet (0.4 mg total) under the tongue every 5 (five) minutes as needed for chest pain.   omega-3 acid ethyl esters 1 g capsule Commonly known as:  LOVAZA Take 2 g by mouth 2 (two) times daily.   ondansetron 4 MG tablet Commonly known as:  ZOFRAN Take 1 tablet (4 mg total) by mouth every 8 (eight) hours as needed for nausea or vomiting.   SUPER B COMPLEX/C Caps Take 1 tablet by mouth 2 (two)  times daily.   traMADol 50 MG tablet Commonly known as:  ULTRAM Take 1 tablet (50 mg total) by mouth every 6 (six) hours as needed.   V-GO 30 Kit use 1 PER DAY       Allergies:  Allergies  Allergen Reactions  . Actos [Pioglitazone]     hairloss  . Metformin And Related Diarrhea  . Novocain [Procaine]     syncopy  . Sulfa Antibiotics Itching    Past Medical History:  Diagnosis Date  . Arthritis   . Complication of anesthesia    takes along time to wake up  . Contact lens/glasses fitting    wears glasses or contacts  . Coronary artery disease 01/2007   s/p PCI of diagonal   . Diabetes mellitus without complication ([place])   . Dyslipidemia   . Hyperlipidemia   . Hypertension   . Hypothyroidism   . Snores   . Vertigo     Past Surgical History:  Procedure Laterality Date  . CARDIAC CATHETERIZATION  2008   stent  . SHOULDER ARTHROSCOPY WITH OPEN ROTATOR CUFF REPAIR Right 02/28/2013   Procedure: right shoulder arthroscopy, distal clavicle sculpting, open rotator cuff repair;  Surgeon: Cammie Sickle., MD;  Location: [facility];  Service: Orthopedics;  Laterality: Right;    Family History  Problem Relation Age of Onset  . Hypertension Father   . Heart attack Father     Social History:  reports that she has never smoked. She has never used smokeless tobacco. She reports that she drinks alcohol. She reports that she does not use drugs.  Review of Systems:  HYPERTENSION:  followed by PCP/cardiologist, blood pressure Is usually controlled on losartan 100 mg, higher today    BP Readings from Last 3 Encounters:  10/23/16 (!) 156/88  09/23/16 (!) 146/84  07/24/16 (!) 148/98     HYPERLIPIDEMIA: The lipid abnormality consists of elevated LDL, taking Vytorin, followed by cardiologist.   She was tried on Crestor but she thinks this caused leg pain and is again on Vytorin   Lab Results  Component Value Date   CHOL 135 10/06/2016   HDL 40  10/06/2016   LDLCALC 63 10/06/2016   LDLDIRECT 121.2 05/08/2013   TRIG 158 (H) 10/06/2016   CHOLHDL 3.4 10/06/2016    History of mild hypothyroidism, previously treated by PCP  She has previously required progressively higher doses of levothyroxine.  She is  taking 100 g daily since 11/2014  No recent fatigue  TSH recently consistently normal  Lab Results  Component Value Date   TSH 3.26 07/21/2016   TSH 3.18 10/30/2015   TSH 2.17 03/05/2015        Examination:   BP (!) 156/88   Pulse 64   Ht 5' 6"  (1.676 m)   Wt 195 lb (88.5 kg)   SpO2 97%   BMI 31.47 kg/m   Body mass index is 31.47 kg/m.     ASSESSMENT/ PLAN:   Diabetes type 2 With BMI 31  See history of present illness for  discussion of current management, blood sugar patterns and problems identified  She has had difficulty getting consistent control of her diabetes However her A1c is 7.1 Most likely her improved control is related to better compliance with mealtime boluses using the V-go pump compared to Humalog injections. She also has started taking Victoza again which seems to be helping both fasting and some postprandial readings However her main difficulty is not getting readings controlled after supper and likely is not getting adequate insulin despite eating relatively smaller meals in the evenings She is still trying to exercise and keeping her weight stable, encouraged her to continue aerobic activity  Discussed needing to check blood sugars more often after supper  to help adjust mealtime insulin Does not need to check blood sugar every single morning She needs to take at least 12-14 units bolus at suppertime to get readings at least under 180 at bedtime She will keep her 30 unit basal on the pump since recent fasting readings are coming down and may be better also with keeping blood sugars better later at night Encourage her to watch carbohydrates at all times Will change her Humalog prescription  reflecting use of the full amount in the V-go pump daily of 66 units Sample of the insulin pumps given until next prescription is covered  LIPIDS: Needs follow-up  HYPOTHYROID: Adequately replaced 100 g of levothyroxine and will continue  INCREASED microalbumin: This is mildly increased and blood pressure may not be adequately controlled, will forward this to PCP and cardiologist  Patient Instructions  Check blood sugars on waking up  4x per weekly  Also check blood sugars about 2 hours after a meal and do this after different meals by rotation  Recommended blood sugar levels on waking up is 90-130 and about 2 hours after meal is 130-160  Please bring your blood sugar monitor to each visit, thank you  40 unit pump; try 3 clicks in am, may need less at dinner  Counseling time on subjects discussed above is over 50% of today's 25 minute visit  Raegen Tarpley 10/23/2016, 10:50 AM                                                  Patient ID: Crystal Hobbs, female   DOB: 1951/09/12, 66 y.o.   MRN: [id number]   Reason for Appointment: Diabetes follow-up   History of Present Illness   Diagnosis: Type 2 DIABETES MELITUS, date of diagnosis:  2011   Past history: Her initial presentation with diabetes was with significant symptoms and glucose of 947 Initially was treated with insulin and subsequently on metformin which she could not tolerate  Since she was not responding to Victoza and had significant hyperglycemia she was started on basal bolus insulin regimen instead  However had required relatively large doses of insulin with inadequate control and blood sugars subsequently improved significantly with adding Victoza in 10/13  Her  A1c was 6.4 in 4/14 and she had overall good control with minimal hypoglycemia She was taken off  Afrezza because of her high out-of-pocket expense and she was tried on V-go pump which she had some difficulties getting this through the pharmacy  RECENT  history:    Insulin regimen: V-go 30 units Humalog boluses 04-21-09 units, 3 times a day Non-insulin hypoglycemic drugs: Victoza 1.2 mg daily   She is on a regimen of basal bolus insulin regimen, using the V-go pump since 04/08/16  Her A1c has improved at 7.1, previously has been 7.5   Current blood sugar patterns and problems identified:  Although her A1c is better her blood sugars at home are recently significantly high and averaging about 178  She thinks that most of her sugars are high because of stress  FASTING blood sugars at home are mostly high although late morning in the lab glucose was only 102  She also appears to have persistently high readings after supper, checking only a few readings towards bedtime.  She is concerned that her insurance is not covering her Humalog well since she is using  up more than what her prescription is written for  She continues to like using the V-go which has helped her compliance with mealtime insulin boluses  VICTOZA: She had been told to try leaving this off on her last visit but recently she has gone back on it since she thinks blood sugars may have been higher without it.  She had been concerned about the cost of this previously  She is using a new monitor and the date and time are incorrect  Side effects from medications:  diarrhea from metformin,? Hair loss from Actos Proper timing of medications in relation to meals: Yes         Monitors blood glucose: Recently 1-2 times  a day.    Glucometer: One Touch ultra mini .          Blood Glucose readings from monitor   Mean values apply above for all meters except median for One Touch  PRE-MEAL Fasting Lunch Dinner Bedtime Overall  Glucose range: 89-328  140- 156  159-178  146-296    Mean/median: 165    257  166     Hypoglycemia: None recently      Meals: 3 meals per day.  Breakfast: may have half a peanut better sandwich Or a granola bar.   Dinner at 6-8 pm, Recently dinner is a smaller  meal than lunch          Physical activity: exercise: doing some walking and is trying to play tennis 2/7 days a week            Diabetes education visit: Most recent: 05/2012      CDE visit 10/17         Wt Readings from Last 3 Encounters:  10/23/16 195 lb (88.5 kg)  09/23/16 197 lb (89.4 kg)  07/24/16 194 lb (88 kg)    Lab Results  Component Value Date   HGBA1C 7.4 (H) 10/20/2016   HGBA1C 7.1 (H) 07/21/2016   HGBA1C 7.5 (H) 03/31/2016   Lab Results  Component Value Date   MICROALBUR 90.1 (H) 10/20/2016   LDLCALC 63 10/06/2016   CREATININE 1.02 10/20/2016    Other problems discussed today: See review of systems   Lab on 10/20/2016  Component Date Value Ref Range Status  . Hgb A1c MFr Bld 10/20/2016 7.4* 4.6 - 6.5 % Final  . Sodium 10/20/2016 139  135 - 145 mEq/L Final  . Potassium 10/20/2016 4.0  3.5 - 5.1 mEq/L Final  . Chloride 10/20/2016 108  96 - 112 mEq/L Final  . CO2 10/20/2016 26  19 - 32 mEq/L Final  . Glucose, Bld 10/20/2016 148* 70 - 99 mg/dL Final  . BUN 10/20/2016 22  6 - 23 mg/dL Final  . Creatinine, Ser 10/20/2016 1.02  0.40 - 1.20 mg/dL Final  . Calcium 10/20/2016 10.0  8.4 - 10.5 mg/dL Final  . GFR 10/20/2016 57.29* >60.00 mL/min Final  . Microalb, Ur 10/20/2016 90.1* 0.0 - 1.9 mg/dL Final  . Creatinine,U 10/20/2016 129.4  mg/dL Final  . Microalb Creat Ratio 10/20/2016 69.6* 0.0 - 30.0 mg/g Final    Allergies as of 10/23/2016      Reactions   Actos [pioglitazone]    hairloss   Metformin And Related Diarrhea   Novocain [procaine]    syncopy   Sulfa Antibiotics Itching      Medication List       Accurate as of 10/23/16 10:50 AM. Always use your most recent med list.  acetaminophen 325 MG tablet Commonly known as:  TYLENOL Take 650 mg by mouth every 6 (six) hours as needed for mild pain.   aspirin 81 MG tablet Take 81 mg by mouth QID. PT STATES SHE TAKES 3-4 ASA QD   B-D UF III MINI PEN NEEDLES 31G X 5 MM Misc Generic drug:   Insulin Pen Needle USE 5 TIMES A DAY AS DIRECTED   Biotin 5000 MCG Caps Take 1 capsule by mouth 2 (two) times daily.   ezetimibe-simvastatin 10-40 MG tablet Commonly known as:  VYTORIN TAKE 1 TABLET BY MOUTH DAILY   glucose blood test strip Commonly known as:  ACCU-CHEK AVIVA PLUS Use as instructed to check blood sugar 3 times per day dx code E11.65   insulin lispro 100 UNIT/ML injection Commonly known as:  HUMALOG Use 66 units in V-go pump daily   levothyroxine 100 MCG tablet Commonly known as:  SYNTHROID, LEVOTHROID take 1 tablet by mouth once daily   losartan 100 MG tablet Commonly known as:  COZAAR Take 1 tablet (100 mg total) by mouth daily. Please keep 09/23/16 appt for further refills   nitroGLYCERIN 0.4 MG SL tablet Commonly known as:  NITROSTAT Place 1 tablet (0.4 mg total) under the tongue every 5 (five) minutes as needed for chest pain.   omega-3 acid ethyl esters 1 g capsule Commonly known as:  LOVAZA Take 2 g by mouth 2 (two) times daily.   ondansetron 4 MG tablet Commonly known as:  ZOFRAN Take 1 tablet (4 mg total) by mouth every 8 (eight) hours as needed for nausea or vomiting.   SUPER B COMPLEX/C Caps Take 1 tablet by mouth 2 (two) times daily.   traMADol 50 MG tablet Commonly known as:  ULTRAM Take 1 tablet (50 mg total) by mouth every 6 (six) hours as needed.   V-GO 30 Kit use 1 PER DAY       Allergies:  Allergies  Allergen Reactions  . Actos [Pioglitazone]     hairloss  . Metformin And Related Diarrhea  . Novocain [Procaine]     syncopy  . Sulfa Antibiotics Itching    Past Medical History:  Diagnosis Date  . Arthritis   . Complication of anesthesia    takes along time to wake up  . Contact lens/glasses fitting    wears glasses or contacts  . Coronary artery disease 01/2007   s/p PCI of diagonal   . Diabetes mellitus without complication ([place])   . Dyslipidemia   . Hyperlipidemia   . Hypertension   . Hypothyroidism   . Snores    . Vertigo     Past Surgical History:  Procedure Laterality Date  . CARDIAC CATHETERIZATION  2008   stent  . SHOULDER ARTHROSCOPY WITH OPEN ROTATOR CUFF REPAIR Right 02/28/2013   Procedure: right shoulder arthroscopy, distal clavicle sculpting, open rotator cuff repair;  Surgeon: Cammie Sickle., MD;  Location: [facility];  Service: Orthopedics;  Laterality: Right;    Family History  Problem Relation Age of Onset  . Hypertension Father   . Heart attack Father     Social History:  reports that she has never smoked. She has never used smokeless tobacco. She reports that she drinks alcohol. She reports that she does not use drugs.  Review of Systems:  HYPERTENSION:  followed by PCP/cardiologist, blood pressure Is usually controlled on losartan 100 mg, higher today  Blood pressure appears to be consistently high and she is going to  be calling her cardiologist for this Likely needs a second medication especially with her increased microalbumin  HYPERLIPIDEMIA: The lipid abnormality consists of elevated LDL, taking Vytorin, followed by cardiologist.   She was tried on Crestor but she thinks this caused leg pain and is on Vytorin   Lab Results  Component Value Date   CHOL 135 10/06/2016   HDL 40 10/06/2016   LDLCALC 63 10/06/2016   LDLDIRECT 121.2 05/08/2013   TRIG 158 (H) 10/06/2016   CHOLHDL 3.4 10/06/2016    History of mild hypothyroidism, previously treated by PCP  She has previously required progressively higher doses of levothyroxine.  She is  taking 100 g daily since 11/2014   TSH recently consistently normal  Lab Results  Component Value Date   TSH 3.26 07/21/2016   TSH 3.18 10/30/2015   TSH 2.17 03/05/2015   NEUROPATHY: She has mild tingling in her feet, chronic and does not require medication for this     Examination:   BP (!) 156/88   Pulse 64   Ht 5' 6"  (1.676 m)   Wt 195 lb (88.5 kg)   SpO2 97%   BMI 31.47 kg/m   Body mass index  is 31.47 kg/m.   Diabetic Foot Exam - Simple   Simple Foot Form Diabetic Foot exam was performed with the following findings:  Yes 10/23/2016 10:04 AM  Visual Inspection No deformities, no ulcerations, no other skin breakdown bilaterally:  Yes Sensation Testing See comments:  Yes Pulse Check Posterior Tibialis and Dorsalis pulse intact bilaterally:  Yes Comments Mild decrease in monofilament sensation distally on the right toes      ASSESSMENT/ PLAN:   Diabetes type 2 With BMI 31  See history of present illness for  discussion of current management, blood sugar patterns and problems identified  She has had difficulty getting consistent control of her diabetesAnd A1c is higher at 7.4  She has mostly high fasting readings are fairly consistently now, although previously this was appearing to be improving her control she now says that it is not helping with or without treatment She is getting enough mealtime insulin with her boluses  Doing fairly well with maintaining her weight exercise, she thinks she is eating healthy She is insulin resistant and likely needs a higher basal insulin overnight May benefit from using the 40 units pump  Discussed that she may need less boluses with increasing her basal Also would consider adding back Victoza if this is not working or she is gaining weight with this  Sample of the 40 units insulin pumps given until next week and she will call if she needs a prescription  LIPIDS: Adequately controlled, she will follow-up with cardiologist also  HYPOTHYROID: Adequately replaced as of last visit  INCREASED microalbumin: This is further increased and blood pressure needs to be more aggressively controlled with either a second drug are more potent ARB drug She will discuss with cardiologist  NEUROPATHY: This is mild, does not desire treatment, Minimal decrease in objective monofilament sensation also   Patient Instructions  Check blood sugars on  waking up  4x per weekly  Also check blood sugars about 2 hours after a meal and do this after different meals by rotation  Recommended blood sugar levels on waking up is 90-130 and about 2 hours after meal is 130-160  Please bring your blood sugar monitor to each visit, thank you  40 unit pump; try 3 clicks in am, may need less at dinner  Counseling time on subjects discussed above is over 50% of today's 25 minute visit  Brookes Craine 10/23/2016, 10:50 AM

## 2016-12-01 ENCOUNTER — Other Ambulatory Visit (INDEPENDENT_AMBULATORY_CARE_PROVIDER_SITE_OTHER): Payer: Medicare Other

## 2016-12-01 DIAGNOSIS — E1165 Type 2 diabetes mellitus with hyperglycemia: Secondary | ICD-10-CM

## 2016-12-01 DIAGNOSIS — Z794 Long term (current) use of insulin: Secondary | ICD-10-CM

## 2016-12-01 LAB — BASIC METABOLIC PANEL
BUN: 20 mg/dL (ref 6–23)
CALCIUM: 10.1 mg/dL (ref 8.4–10.5)
CO2: 26 mEq/L (ref 19–32)
Chloride: 107 mEq/L (ref 96–112)
Creatinine, Ser: 0.97 mg/dL (ref 0.40–1.20)
GFR: 60.69 mL/min (ref >60.00)
GLUCOSE: 150 mg/dL — AB (ref 70–99)
Potassium: 4.3 mEq/L (ref 3.5–5.1)
Sodium: 140 mEq/L (ref 135–145)

## 2016-12-01 LAB — TSH: TSH: 3.61 u[IU]/mL (ref 0.35–4.50)

## 2016-12-02 LAB — FRUCTOSAMINE: Fructosamine: 245 umol/L (ref 0–285)

## 2016-12-04 ENCOUNTER — Ambulatory Visit (INDEPENDENT_AMBULATORY_CARE_PROVIDER_SITE_OTHER): Payer: Medicare Other | Admitting: Endocrinology

## 2016-12-04 ENCOUNTER — Encounter: Payer: Self-pay | Admitting: Endocrinology

## 2016-12-04 VITALS — BP 150/88 | HR 66 | Ht 66.0 in | Wt 196.0 lb

## 2016-12-04 DIAGNOSIS — E1165 Type 2 diabetes mellitus with hyperglycemia: Secondary | ICD-10-CM | POA: Diagnosis not present

## 2016-12-04 DIAGNOSIS — Z794 Long term (current) use of insulin: Secondary | ICD-10-CM

## 2016-12-04 NOTE — Progress Notes (Signed)
Patient ID: Crystal Hobbs, female   DOB: 11-06-1950, 66 y.o.   MRN: [id number]   Reason for Appointment: Diabetes follow-up   History of Present Illness   Diagnosis: Type 2 DIABETES MELITUS, date of diagnosis:  2011   Past history: Her initial presentation with diabetes was with significant symptoms and glucose of 947 Initially was treated with insulin and subsequently on metformin which she could not tolerate  Since she was not responding to Victoza and had significant hyperglycemia she was started on basal bolus insulin regimen instead  However had required relatively large doses of insulin with inadequate control and blood sugars subsequently improved significantly with adding Victoza in 10/13  Her  A1c was 6.4 in 4/14 and she had overall good control with minimal hypoglycemia She was taken off  Afrezza because of her high out-of-pocket expense and she was tried on V-go pump which she had some difficulties getting this through the pharmacy  RECENT history:    Insulin regimen: V-go 30 units Humalog boluses 06-23-13 units, 3 times a day Non-insulin hypoglycemic drugs: None   She is on a regimen of basal bolus insulin regimen, using the V-go pump since 04/08/16  Her A1c in 2/18 had gone up relatively up to 7.4, previously was improved at 7.1 Fructosamine 245  Current blood sugar patterns and problems identified:  She was told to try the 40 unit basal because of persistently high fasting readings but she thinks it did not help her sugars and she went back to the 30 unit pump.  Until about 10 days ago her fasting readings were ranging from 162-212 but since the 13th they have been ranging from 126-173 with only occasional readings over about 150  Nonfasting readings were as high as 295 but the last 2 days they have been 97 and 138  She says she has changed her diet significantly since she moved to a new home where she is able to cook at home and not eat out  She has been  active with moving but also tries to either do some walking or play tennis at times  She is taking relatively more boluses at breakfast and lunch   Usually when trying to exercise she will keep an energy bar with her but sometimes will eat a banana before going for tennis  Side effects from medications:  diarrhea from metformin,? Hair loss from Actos Proper timing of medications in relation to meals: Yes         Monitors blood glucose: Recently 1-2 times  a day.    Glucometer: One Touch ultra mini .          Blood Glucose readings from monitor as above  Overall median 170  Hypoglycemia: None recently      Meals: 3 meals per day.  Breakfast: may have half a peanut better sandwich Or a granola bar.   Dinner at 6-8 pm, Recently dinner is a smaller meal than lunch          Physical activity: exercise: doing walking and was trying to play tennis 2/7 days a week            Diabetes education visit: Most recent: 05/2012      CDE visit 10/17         Wt Readings from Last 3 Encounters:  12/04/16 196 lb (88.9 kg)  10/23/16 195 lb (88.5 kg)  09/23/16 197 lb (89.4 kg)    Lab Results  Component Value Date  HGBA1C 7.4 (H) 10/20/2016   HGBA1C 7.1 (H) 07/21/2016   HGBA1C 7.5 (H) 03/31/2016   Lab Results  Component Value Date   MICROALBUR 90.1 (H) 10/20/2016   LDLCALC 63 10/06/2016   CREATININE 0.97 12/01/2016    Other problems discussed today: See review of systems   Lab on 12/01/2016  Component Date Value Ref Range Status  . Fructosamine 12/01/2016 245  0 - 285 umol/L Final   Comment: Published reference interval for apparently healthy subjects between age 55 and 23 is 21 - 285 umol/L and in a poorly controlled diabetic population is 228 - 563 umol/L with a mean of 396 umol/L.   Marland Kitchen Sodium 12/01/2016 140  135 - 145 mEq/L Final  . Potassium 12/01/2016 4.3  3.5 - 5.1 mEq/L Final  . Chloride 12/01/2016 107  96 - 112 mEq/L Final  . CO2 12/01/2016 26  19 - 32 mEq/L Final  .  Glucose, Bld 12/01/2016 150* 70 - 99 mg/dL Final  . BUN 12/01/2016 20  6 - 23 mg/dL Final  . Creatinine, Ser 12/01/2016 0.97  0.40 - 1.20 mg/dL Final  . Calcium 12/01/2016 10.1  8.4 - 10.5 mg/dL Final  . GFR 12/01/2016 60.69  >60.00 mL/min Final  . TSH 12/01/2016 3.61  0.35 - 4.50 uIU/mL Final    Allergies as of 12/04/2016      Reactions   Actos [pioglitazone]    hairloss   Metformin And Related Diarrhea   Novocain [procaine]    syncopy   Sulfa Antibiotics Itching      Medication List       Accurate as of 12/04/16 10:13 AM. Always use your most recent med list.          acetaminophen 325 MG tablet Commonly known as:  TYLENOL Take 650 mg by mouth every 6 (six) hours as needed for mild pain.   aspirin 81 MG tablet Take 81 mg by mouth QID. PT STATES SHE TAKES 3-4 ASA QD   B-D UF III MINI PEN NEEDLES 31G X 5 MM Misc Generic drug:  Insulin Pen Needle USE 5 TIMES A DAY AS DIRECTED   Biotin 5000 MCG Caps Take 1 capsule by mouth 2 (two) times daily.   ezetimibe-simvastatin 10-40 MG tablet Commonly known as:  VYTORIN TAKE 1 TABLET BY MOUTH DAILY   glucose blood test strip Commonly known as:  ACCU-CHEK AVIVA PLUS Use as instructed to check blood sugar 3 times per day dx code E11.65   insulin lispro 100 UNIT/ML injection Commonly known as:  HUMALOG Use 66 units in V-go pump daily   levothyroxine 100 MCG tablet Commonly known as:  SYNTHROID, LEVOTHROID take 1 tablet by mouth once daily   losartan 100 MG tablet Commonly known as:  COZAAR Take 1 tablet (100 mg total) by mouth daily. Please keep 09/23/16 appt for further refills   nitroGLYCERIN 0.4 MG SL tablet Commonly known as:  NITROSTAT Place 1 tablet (0.4 mg total) under the tongue every 5 (five) minutes as needed for chest pain.   omega-3 acid ethyl esters 1 g capsule Commonly known as:  LOVAZA Take 2 g by mouth 2 (two) times daily.   ondansetron 4 MG tablet Commonly known as:  ZOFRAN Take 1 tablet (4 mg  total) by mouth every 8 (eight) hours as needed for nausea or vomiting.   SUPER B COMPLEX/C Caps Take 1 tablet by mouth 2 (two) times daily.   traMADol 50 MG tablet Commonly known as:  ULTRAM Take  1 tablet (50 mg total) by mouth every 6 (six) hours as needed.   V-GO 30 Kit use 1 PER DAY       Allergies:  Allergies  Allergen Reactions  . Actos [Pioglitazone]     hairloss  . Metformin And Related Diarrhea  . Novocain [Procaine]     syncopy  . Sulfa Antibiotics Itching    Past Medical History:  Diagnosis Date  . Arthritis   . Complication of anesthesia    takes along time to wake up  . Contact lens/glasses fitting    wears glasses or contacts  . Coronary artery disease 01/2007   s/p PCI of diagonal   . Diabetes mellitus without complication ([place])   . Dyslipidemia   . Hyperlipidemia   . Hypertension   . Hypothyroidism   . Snores   . Vertigo     Past Surgical History:  Procedure Laterality Date  . CARDIAC CATHETERIZATION  2008   stent  . SHOULDER ARTHROSCOPY WITH OPEN ROTATOR CUFF REPAIR Right 02/28/2013   Procedure: right shoulder arthroscopy, distal clavicle sculpting, open rotator cuff repair;  Surgeon: Cammie Sickle., MD;  Location: [facility];  Service: Orthopedics;  Laterality: Right;    Family History  Problem Relation Age of Onset  . Hypertension Father   . Heart attack Father     Social History:  reports that she has never smoked. She has never used smokeless tobacco. She reports that she drinks alcohol. She reports that she does not use drugs.  Review of Systems:  HYPERTENSION:  followed by PCP/cardiologist, blood pressure Is usually controlled on losartan 100 mg, higher today    BP Readings from Last 3 Encounters:  12/04/16 (!) 150/88  10/23/16 (!) 156/88  09/23/16 (!) 146/84     HYPERLIPIDEMIA: The lipid abnormality consists of elevated LDL, taking Vytorin, followed by cardiologist.   She was tried on Crestor but she  thinks this caused leg pain and is again on Vytorin   Lab Results  Component Value Date   CHOL 135 10/06/2016   HDL 40 10/06/2016   LDLCALC 63 10/06/2016   LDLDIRECT 121.2 05/08/2013   TRIG 158 (H) 10/06/2016   CHOLHDL 3.4 10/06/2016    History of mild hypothyroidism, previously treated by PCP  She has previously required progressively higher doses of levothyroxine.  She is  taking 100 g daily since 11/2014  No recent fatigue  TSH recently consistently normal  Lab Results  Component Value Date   TSH 3.61 12/01/2016   TSH 3.26 07/21/2016   TSH 3.18 10/30/2015        Examination:   BP (!) 150/88   Pulse 66   Ht 5' 6"  (1.676 m)   Wt 196 lb (88.9 kg)   SpO2 96%   BMI 31.64 kg/m   Body mass index is 31.64 kg/m.     ASSESSMENT/ PLAN:   Diabetes type 2 With BMI 31  See history of present illness for  discussion of current management, blood sugar patterns and problems identified  She has had difficulty getting consistent control of her diabetes However her A1c is 7.1 Most likely her improved control is related to better compliance with mealtime boluses using the V-go pump compared to Humalog injections. She also has started taking Victoza again which seems to be helping both fasting and some postprandial readings However her main difficulty is not getting readings controlled after supper and likely is not getting adequate insulin despite eating relatively smaller  meals in the evenings She is still trying to exercise and keeping her weight stable, encouraged her to continue aerobic activity  Discussed needing to check blood sugars more often after supper to help adjust mealtime insulin Does not need to check blood sugar every single morning She needs to take at least 12-14 units bolus at suppertime to get readings at least under 180 at bedtime She will keep her 30 unit basal on the pump since recent fasting readings are coming down and may be better also with keeping  blood sugars better later at night Encourage her to watch carbohydrates at all times Will change her Humalog prescription reflecting use of the full amount in the V-go pump daily of 66 units Sample of the insulin pumps given until next prescription is covered  LIPIDS: Needs follow-up  HYPOTHYROID: Adequately replaced 100 g of levothyroxine and will continue  INCREASED microalbumin: This is mildly increased and blood pressure may not be adequately controlled, will forward this to PCP and cardiologist  There are no Patient Instructions on file for this visit.Counseling time on subjects discussed above is over 50% of today's 25 minute visit  Crystal Hobbs 12/04/2016, 10:13 AM                                                  Patient ID: Crystal Hobbs, female   DOB: 1951-04-04, 66 y.o.   MRN: [id number]   Reason for Appointment: Diabetes follow-up   History of Present Illness   Diagnosis: Type 2 DIABETES MELITUS, date of diagnosis:  2011   Past history: Her initial presentation with diabetes was with significant symptoms and glucose of 947 Initially was treated with insulin and subsequently on metformin which she could not tolerate  Since she was not responding to Victoza and had significant hyperglycemia she was started on basal bolus insulin regimen instead  However had required relatively large doses of insulin with inadequate control and blood sugars subsequently improved significantly with adding Victoza in 10/13  Her  A1c was 6.4 in 4/14 and she had overall good control with minimal hypoglycemia She was taken off  Afrezza because of her high out-of-pocket expense and she was tried on V-go pump which she had some difficulties getting this through the pharmacy  RECENT history:    Insulin regimen: V-go 30 units Humalog boluses 04-21-09 units, 3 times a day Non-insulin hypoglycemic drugs: Victoza 1.2 mg daily   She is on a regimen of basal bolus insulin regimen, using the V-go pump  since 04/08/16  Her A1c has improved at 7.1, previously has been 7.5   Current blood sugar patterns and problems identified:  Although her A1c is better her blood sugars at home are recently significantly high and averaging about 178  She thinks that most of her sugars are high because of stress  FASTING blood sugars at home are mostly high although late morning in the lab glucose was only 102  She also appears to have persistently high readings after supper, checking only a few readings towards bedtime.  She is concerned that her insurance is not covering her Humalog well since she is using up more than what her prescription is written for  She continues to like using the V-go which has helped her compliance with mealtime insulin boluses  VICTOZA: She had been told to try leaving this  off on her last visit but recently she has gone back on it since she thinks blood sugars may have been higher without it.  She had been concerned about the cost of this previously  She is using a new monitor and the date and time are incorrect  Side effects from medications:  diarrhea from metformin,? Hair loss from Actos Proper timing of medications in relation to meals: Yes         Monitors blood glucose: Recently 1-2 times  a day.    Glucometer: One Touch ultra mini .          Blood Glucose readings from monitor   Mean values apply above for all meters except median for One Touch  PRE-MEAL Fasting Lunch Dinner Bedtime Overall  Glucose range: 89-328  140- 156  159-178  146-296    Mean/median: 165    257  166     Hypoglycemia: None recently      Meals: 3 meals per day.  Breakfast: may have half a peanut better sandwich Or a granola bar.   Dinner at 6-8 pm, Recently dinner is a smaller meal than lunch          Physical activity: exercise: doing some walking and is trying to play tennis 2/7 days a week            Diabetes education visit: Most recent: 05/2012      CDE visit 10/17         Wt  Readings from Last 3 Encounters:  12/04/16 196 lb (88.9 kg)  10/23/16 195 lb (88.5 kg)  09/23/16 197 lb (89.4 kg)    Lab Results  Component Value Date   HGBA1C 7.4 (H) 10/20/2016   HGBA1C 7.1 (H) 07/21/2016   HGBA1C 7.5 (H) 03/31/2016   Lab Results  Component Value Date   MICROALBUR 90.1 (H) 10/20/2016   LDLCALC 63 10/06/2016   CREATININE 0.97 12/01/2016    Other problems discussed today: See review of systems   Lab on 12/01/2016  Component Date Value Ref Range Status  . Fructosamine 12/01/2016 245  0 - 285 umol/L Final   Comment: Published reference interval for apparently healthy subjects between age 47 and 26 is 30 - 285 umol/L and in a poorly controlled diabetic population is 228 - 563 umol/L with a mean of 396 umol/L.   Marland Kitchen Sodium 12/01/2016 140  135 - 145 mEq/L Final  . Potassium 12/01/2016 4.3  3.5 - 5.1 mEq/L Final  . Chloride 12/01/2016 107  96 - 112 mEq/L Final  . CO2 12/01/2016 26  19 - 32 mEq/L Final  . Glucose, Bld 12/01/2016 150* 70 - 99 mg/dL Final  . BUN 12/01/2016 20  6 - 23 mg/dL Final  . Creatinine, Ser 12/01/2016 0.97  0.40 - 1.20 mg/dL Final  . Calcium 12/01/2016 10.1  8.4 - 10.5 mg/dL Final  . GFR 12/01/2016 60.69  >60.00 mL/min Final  . TSH 12/01/2016 3.61  0.35 - 4.50 uIU/mL Final    Allergies as of 12/04/2016      Reactions   Actos [pioglitazone]    hairloss   Metformin And Related Diarrhea   Novocain [procaine]    syncopy   Sulfa Antibiotics Itching      Medication List       Accurate as of 12/04/16 10:13 AM. Always use your most recent med list.          acetaminophen 325 MG tablet Commonly known as:  TYLENOL Take 650  mg by mouth every 6 (six) hours as needed for mild pain.   aspirin 81 MG tablet Take 81 mg by mouth QID. PT STATES SHE TAKES 3-4 ASA QD   B-D UF III MINI PEN NEEDLES 31G X 5 MM Misc Generic drug:  Insulin Pen Needle USE 5 TIMES A DAY AS DIRECTED   Biotin 5000 MCG Caps Take 1 capsule by mouth 2 (two) times  daily.   ezetimibe-simvastatin 10-40 MG tablet Commonly known as:  VYTORIN TAKE 1 TABLET BY MOUTH DAILY   glucose blood test strip Commonly known as:  ACCU-CHEK AVIVA PLUS Use as instructed to check blood sugar 3 times per day dx code E11.65   insulin lispro 100 UNIT/ML injection Commonly known as:  HUMALOG Use 66 units in V-go pump daily   levothyroxine 100 MCG tablet Commonly known as:  SYNTHROID, LEVOTHROID take 1 tablet by mouth once daily   losartan 100 MG tablet Commonly known as:  COZAAR Take 1 tablet (100 mg total) by mouth daily. Please keep 09/23/16 appt for further refills   nitroGLYCERIN 0.4 MG SL tablet Commonly known as:  NITROSTAT Place 1 tablet (0.4 mg total) under the tongue every 5 (five) minutes as needed for chest pain.   omega-3 acid ethyl esters 1 g capsule Commonly known as:  LOVAZA Take 2 g by mouth 2 (two) times daily.   ondansetron 4 MG tablet Commonly known as:  ZOFRAN Take 1 tablet (4 mg total) by mouth every 8 (eight) hours as needed for nausea or vomiting.   SUPER B COMPLEX/C Caps Take 1 tablet by mouth 2 (two) times daily.   traMADol 50 MG tablet Commonly known as:  ULTRAM Take 1 tablet (50 mg total) by mouth every 6 (six) hours as needed.   V-GO 30 Kit use 1 PER DAY       Allergies:  Allergies  Allergen Reactions  . Actos [Pioglitazone]     hairloss  . Metformin And Related Diarrhea  . Novocain [Procaine]     syncopy  . Sulfa Antibiotics Itching    Past Medical History:  Diagnosis Date  . Arthritis   . Complication of anesthesia    takes along time to wake up  . Contact lens/glasses fitting    wears glasses or contacts  . Coronary artery disease 01/2007   s/p PCI of diagonal   . Diabetes mellitus without complication ([place])   . Dyslipidemia   . Hyperlipidemia   . Hypertension   . Hypothyroidism   . Snores   . Vertigo     Past Surgical History:  Procedure Laterality Date  . CARDIAC CATHETERIZATION  2008   stent   . SHOULDER ARTHROSCOPY WITH OPEN ROTATOR CUFF REPAIR Right 02/28/2013   Procedure: right shoulder arthroscopy, distal clavicle sculpting, open rotator cuff repair;  Surgeon: Cammie Sickle., MD;  Location: [facility];  Service: Orthopedics;  Laterality: Right;    Family History  Problem Relation Age of Onset  . Hypertension Father   . Heart attack Father     Social History:  reports that she has never smoked. She has never used smokeless tobacco. She reports that she drinks alcohol. She reports that she does not use drugs.  Review of Systems:  HYPERTENSION:  followed by PCP/cardiologist, blood pressure Is usually controlled on losartan 100 mg, higher This year She has seen her PCP recently She says she is going to check her blood pressure and contact her cardiologist who is monitoring  and treating her blood pressure   Likely needs a second medication especially with her increased microalbumin  HYPERLIPIDEMIA: The lipid abnormality consists of elevated LDL, taking Vytorin, followed by cardiologist.   She was tried on Crestor but she thinks this caused leg pain and is on Vytorin   Lab Results  Component Value Date   CHOL 135 10/06/2016   HDL 40 10/06/2016   LDLCALC 63 10/06/2016   LDLDIRECT 121.2 05/08/2013   TRIG 158 (H) 10/06/2016   CHOLHDL 3.4 10/06/2016    History of mild hypothyroidism, previously treated by PCP  She has previously required progressively higher doses of levothyroxine.  She is  taking 100 g daily since 11/2014   TSH Is now consistently normal  Lab Results  Component Value Date   TSH 3.61 12/01/2016   TSH 3.26 07/21/2016   TSH 3.18 10/30/2015   NEUROPATHY: She has mild tingling in her feet, chronic and does not require medication for this     Examination:   BP (!) 150/88   Pulse 66   Ht 5' 6"  (1.676 m)   Wt 196 lb (88.9 kg)   SpO2 96%   BMI 31.64 kg/m   Body mass index is 31.64 kg/m.     ASSESSMENT/ PLAN:   Diabetes  type 2 With BMI 31  See history of present illness for  discussion of current management, blood sugar patterns and problems identified  Her blood sugars are appearing to be significant little better with her trying to improve her diet and eating at home more than at restaurants in the last 10 days or so She reportedly did not have better fasting readings with given the 40 units basal Also not requiring Victoza at this time and fructosamine is fairly good  However she does not check enough readings after meals She'll continue the same regimen, and emphasized the need for checking more readings after meals Also discussed having a carbohydrate snack or juice before exercising but also if she is playing tennis after dinner she will reduce her boluses by 2-4 units  HYPOTHYROID: Adequately replaced   INCREASED microalbumin: This is further increased and blood pressure needs to be more aggressively controlled with either a second drug like diltiazem or more potent ARB drug She will discuss with cardiologist, is going to call with her blood pressure readings    There are no Patient Instructions on file for this visit.   Crystal Hobbs 12/04/2016, 10:13 AM

## 2016-12-16 ENCOUNTER — Other Ambulatory Visit: Payer: Self-pay | Admitting: Endocrinology

## 2016-12-26 IMAGING — CT CT MAXILLOFACIAL W/O CM
3 of 7 series · 16 of 47 positions shown, 19 images · non-contrast
Comparison: None.

CLINICAL DATA: Fall, facial injury, headache

EXAM:
CT HEAD WITHOUT CONTRAST
CT MAXILLOFACIAL WITHOUT CONTRAST
TECHNIQUE: Multidetector CT imaging of the head and maxillofacial structures
were performed using the standard protocol without intravenous
contrast. Multiplanar CT image reconstructions of the maxillofacial
structures were also generated.

[Series 203: coronal st, idose (1) · coronal · 0.40mm/px · 3 of 78 slices shown]
[im 20/78  bone]
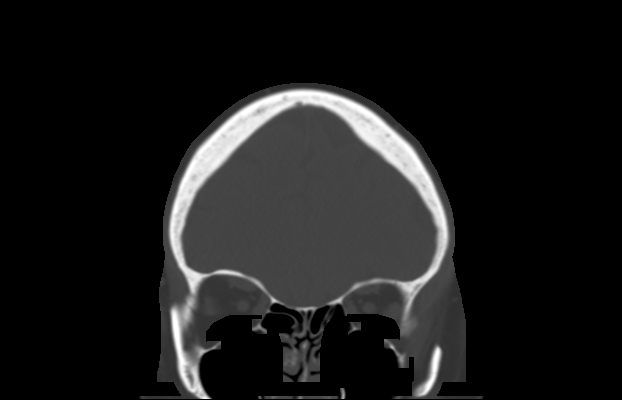
[im 39/78  bone]
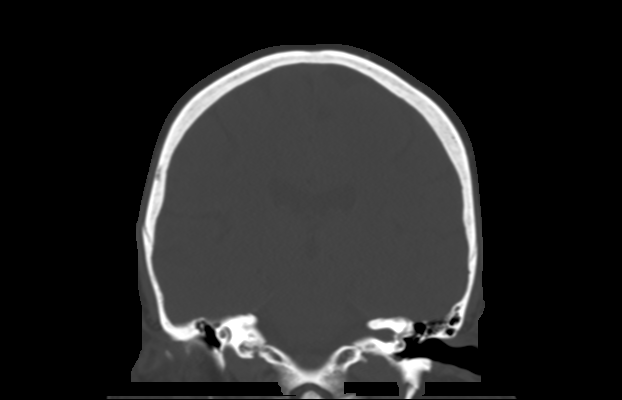
[im 58/78  bone]
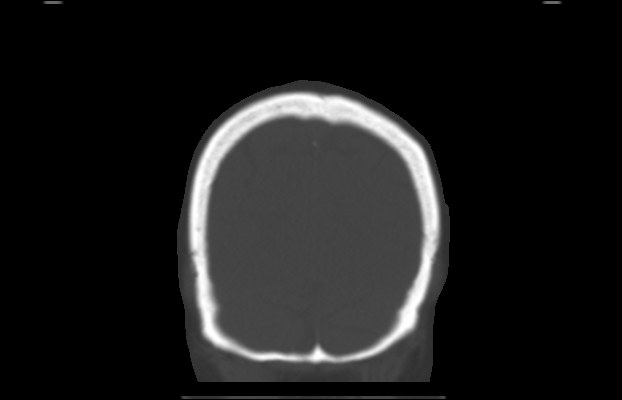

[Series 204: sagittal st, idose (1) · sagittal · 0.40mm/px · 2 of 83 slices shown]
[im 28/83  bone]
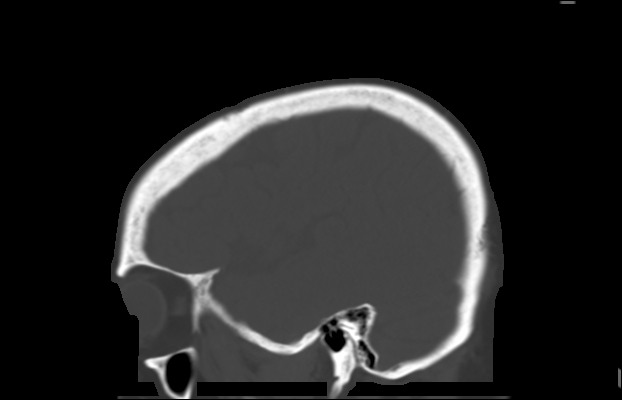
[im 55/83  bone]
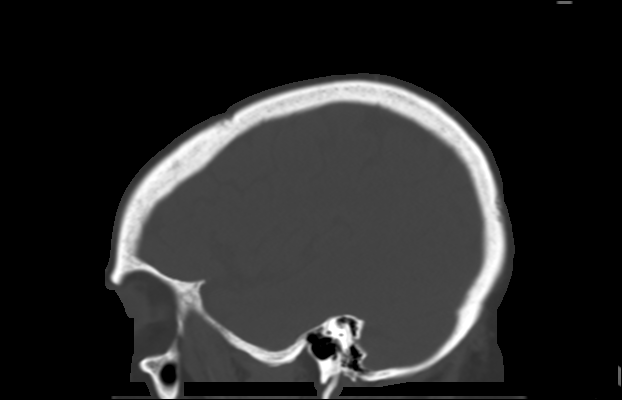

[Series 301: facial bones, idose (1) · axial · 0.32mm/px · z∈[+21,+153]mm · 11 of 80 slices shown, 14 images]
[im 7/80  brain]
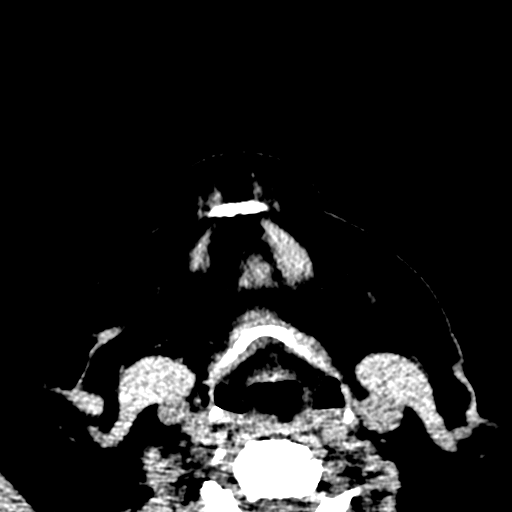
[im 7/80  bone]
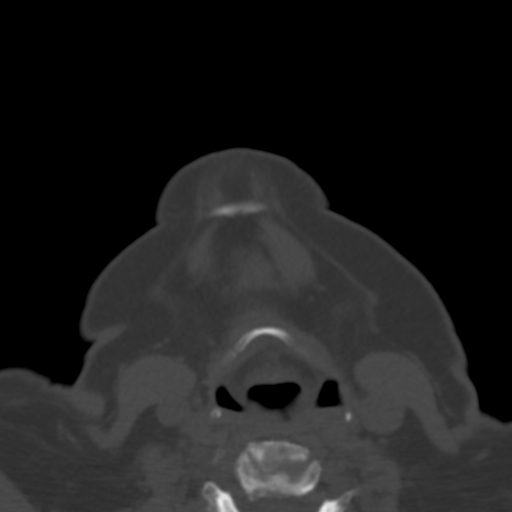
[im 13/80  bone]
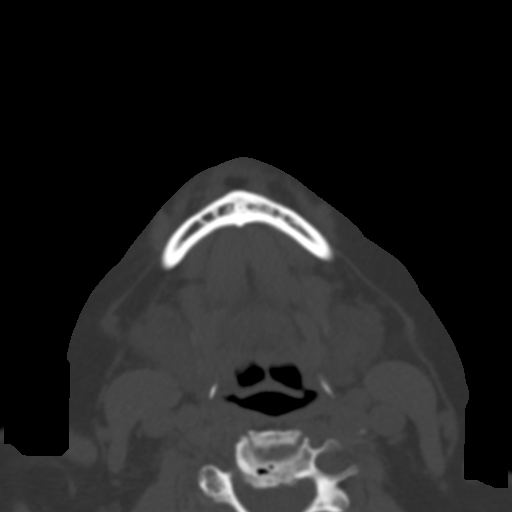
[im 19/80  bone]
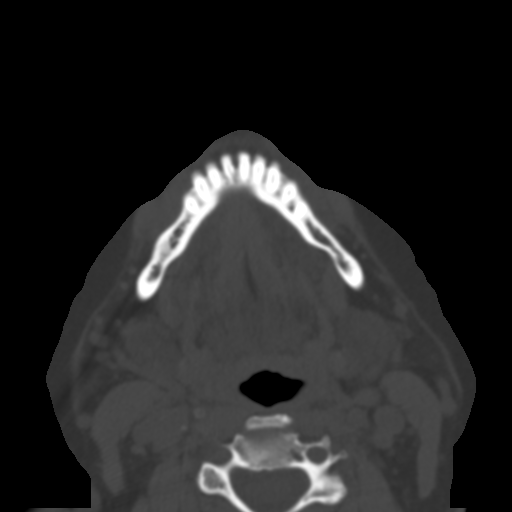
[im 25/80  bone]
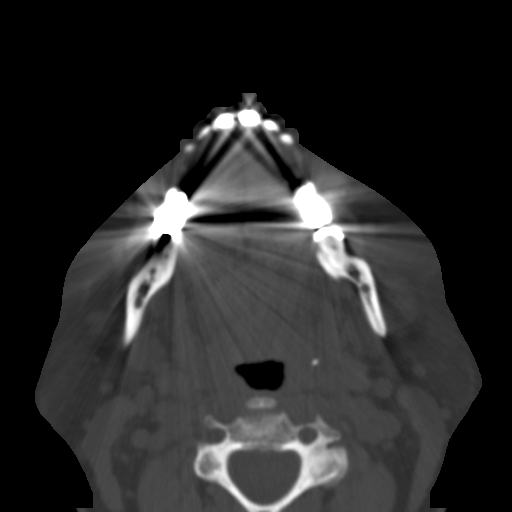
[im 31/80  brain]
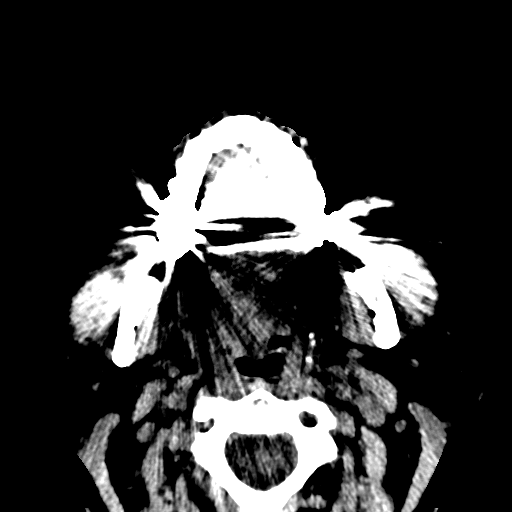
[im 31/80  bone]
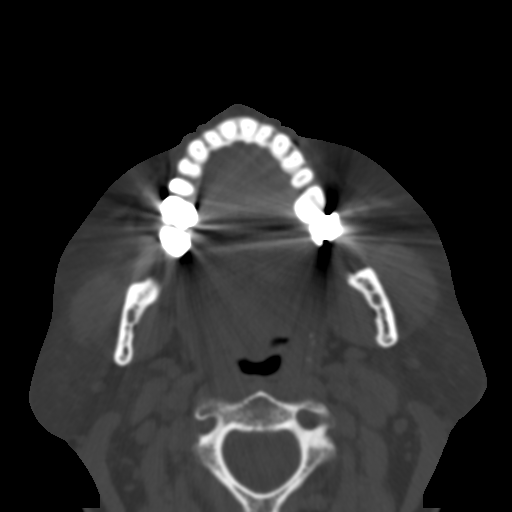
[im 43/80  bone]
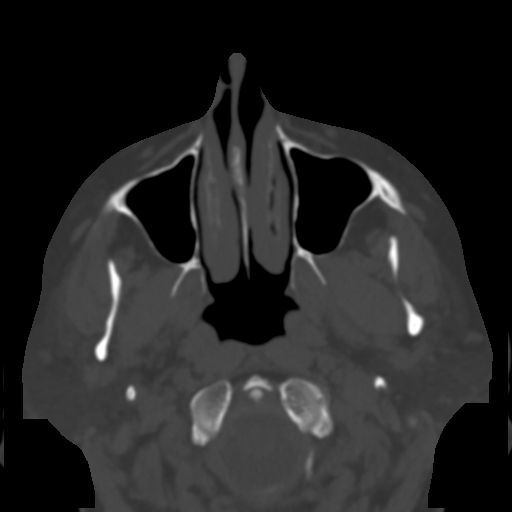
[im 49/80  bone]
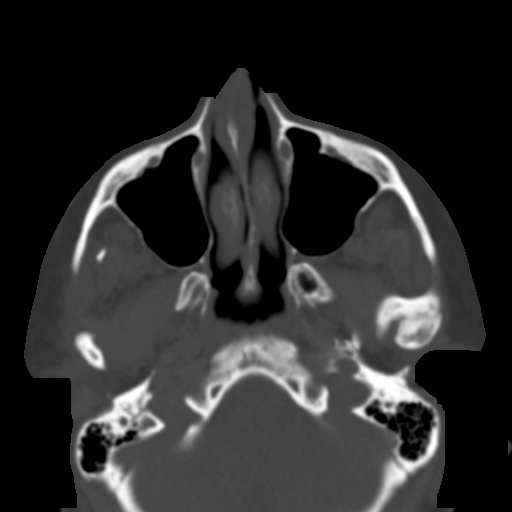
[im 55/80  bone]
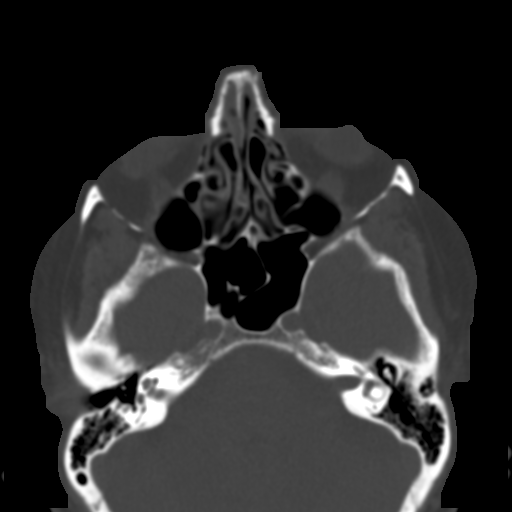
[im 61/80  brain]
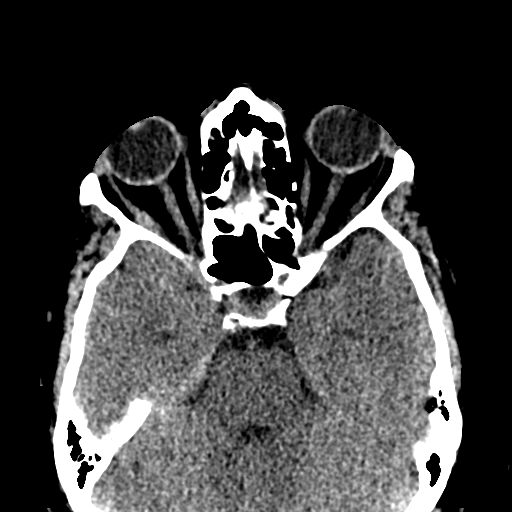
[im 61/80  bone]
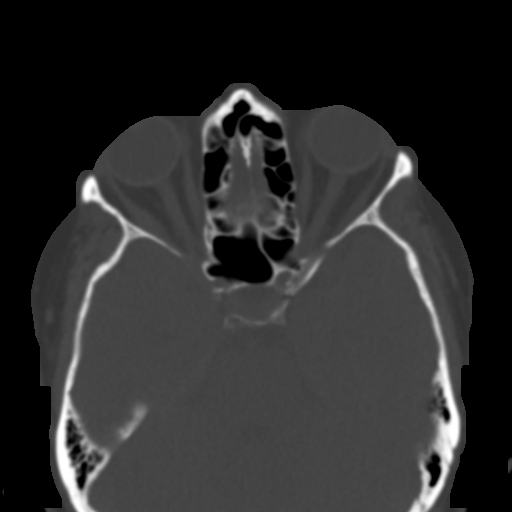
[im 67/80  bone]
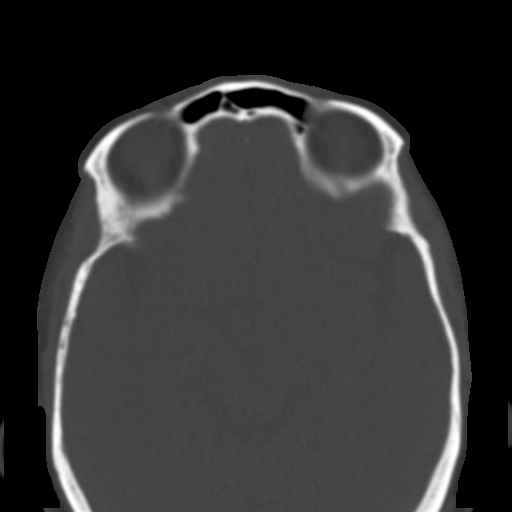
[im 73/80  bone]
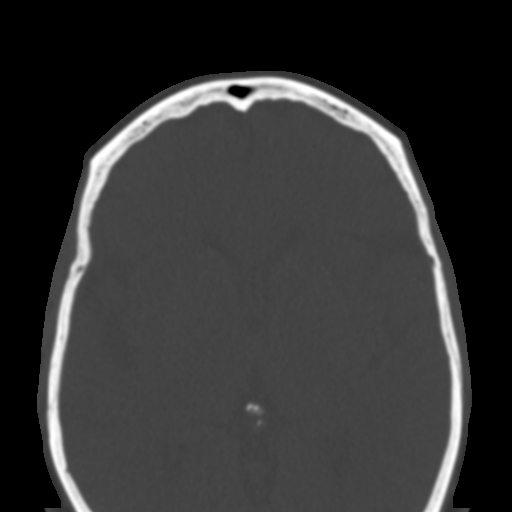

[16 of 47 positions shown; findings below may reference images not displayed]

FINDINGS: CT HEAD FINDINGS

No evidence of parenchymal hemorrhage or extra-axial fluid
collection. No mass lesion, mass effect, or midline shift.

No CT evidence of acute infarction.

Mild subcortical white matter and periventricular small vessel
ischemic changes.

Cerebral volume is within normal limits.  No ventriculomegaly.

The visualized paranasal sinuses are essentially clear. The mastoid
air cells are unopacified.

Nondisplaced bilateral nasal bone fractures (series 202/image 9).
Overlying mild soft tissue swelling.

No evidence of calvarial fracture.

CT MAXILLOFACIAL FINDINGS

Nondisplaced, mildly comminuted bilateral nasal bone fractures
(series 302/ images 27 and 30). Overlying mild soft tissue swelling.

Otherwise, no evidence of maxillofacial fracture.

The bilateral orbits, including the globes and retroconal soft
tissues, are within normal limits.

The mandible is intact. The bilateral mandibular condyles are
well-seated in the TMJs.

The visualized paranasal sinuses are essentially clear. The mastoid
air cells are unopacified.

Cervical spine is intact to the bottom of C6. Mild degenerative
changes of the mid cervical spine.
IMPRESSION: Nondisplaced bilateral nasal bone fractures. Otherwise, no evidence
of maxillofacial fracture.

No evidence of acute intracranial abnormality.

## 2016-12-28 ENCOUNTER — Other Ambulatory Visit: Payer: Self-pay | Admitting: Endocrinology

## 2017-02-01 ENCOUNTER — Other Ambulatory Visit: Payer: Self-pay | Admitting: Cardiology

## 2017-03-02 ENCOUNTER — Other Ambulatory Visit (INDEPENDENT_AMBULATORY_CARE_PROVIDER_SITE_OTHER): Payer: Medicare Other

## 2017-03-02 DIAGNOSIS — E1165 Type 2 diabetes mellitus with hyperglycemia: Secondary | ICD-10-CM | POA: Diagnosis not present

## 2017-03-02 DIAGNOSIS — Z794 Long term (current) use of insulin: Secondary | ICD-10-CM | POA: Diagnosis not present

## 2017-03-02 LAB — HEMOGLOBIN A1C: Hgb A1c MFr Bld: 7.7 % — ABNORMAL HIGH (ref 4.6–6.5)

## 2017-03-02 LAB — GLUCOSE, RANDOM: Glucose, Bld: 72 mg/dL (ref 70–99)

## 2017-03-05 ENCOUNTER — Ambulatory Visit (INDEPENDENT_AMBULATORY_CARE_PROVIDER_SITE_OTHER): Payer: Medicare Other | Admitting: Endocrinology

## 2017-03-05 ENCOUNTER — Encounter: Payer: Self-pay | Admitting: Endocrinology

## 2017-03-05 ENCOUNTER — Other Ambulatory Visit: Payer: Self-pay | Admitting: Endocrinology

## 2017-03-05 VITALS — BP 136/88 | HR 69 | Ht 66.0 in | Wt 191.0 lb

## 2017-03-05 DIAGNOSIS — E1165 Type 2 diabetes mellitus with hyperglycemia: Secondary | ICD-10-CM

## 2017-03-05 DIAGNOSIS — I10 Essential (primary) hypertension: Secondary | ICD-10-CM

## 2017-03-05 DIAGNOSIS — Z794 Long term (current) use of insulin: Secondary | ICD-10-CM | POA: Diagnosis not present

## 2017-03-05 NOTE — Patient Instructions (Signed)
Check blood sugars on waking up    Also check blood sugars about 2 hours after a meal and do this after different meals by rotation  Recommended blood sugar levels on waking up is 90-130 and about 2 hours after meal is 130-160  Please bring your blood sugar monitor to each visit, thank you  Invokana 100mg   Losartan 1/2 daily

## 2017-03-05 NOTE — Progress Notes (Signed)
Patient ID: Crystal Hobbs, female   DOB: Nov 30, 1950, 66 y.o.   MRN: [id number]   Reason for Appointment: Diabetes follow-up   History of Present Illness   Diagnosis: Type 2 DIABETES MELITUS, date of diagnosis:  2011   Past history: Her initial presentation with diabetes was with significant symptoms and glucose of 947 Initially was treated with insulin and subsequently on metformin which she could not tolerate  Since she was not responding to Victoza and had significant hyperglycemia she was started on basal bolus insulin regimen instead  However had required relatively large doses of insulin with inadequate control and blood sugars subsequently improved significantly with adding Victoza in 10/13  Her  A1c was 6.4 in 4/14 and she had overall good control with minimal hypoglycemia She was taken off  Afrezza because of her high out-of-pocket expense and she was tried on V-go pump which she had some difficulties getting this through the pharmacy  RECENT history:    Insulin regimen: V-go 30 units Humalog boluses 06-23-13 units, 3 times a day Non-insulin hypoglycemic drugs: None   She is on a regimen of basal bolus insulin regimen, using the V-go pump since 04/08/16  Her A1c has increased gradually over the last 3 measurements and now 7.7  Current blood sugar patterns and problems identified:  She has had fairly persistently high readings fasting and usually after supper according to her recent download  Previously she was recommended the 40 units basal pump and even though she try to sample she reported that he did not help her morning readings  Although she has exercised and lost some weight her blood sugars are difficult to control with basal bolus insulin alone  Over the last 10 days she has decided to eliminate most of her carbohydrates and with this her blood sugars have been mostly excellent with morning and before and after supper  However she has had some carbohydrates  last 2 or 3 days and readings are as high as 207 at bedtime  Again she is checking blood sugars mostly in the morning and sporadically later in the day, blood sugars tend to be relatively better in the afternoon   Usually before exercise she will keep an energy bar with her but sometimes will forget and may feel hypoglycemic, recently exercising in the morning  Side effects from medications:  diarrhea from metformin,? Hair loss from Actos Proper timing of medications in relation to meals: Yes         Monitors blood glucose: Recently 1-2 times  a day.    Glucometer: One Touch ultra mini .          Blood Glucose readings from monitor as above  Mean values apply above for all meters except median for One Touch  PRE-MEAL Fasting Lunch Dinner Bedtime Overall  Glucose range: 111-195    99-1 81  80-260  80-286   Mean/median: 158    160  152+/-42     Hypoglycemia: As above Meals: 3 meals per day.  Breakfast: may have half a peanut better sandwich Or a granola bar.    Dinner at 6-8 pm        Physical activity: exercise: doing walking and was trying to play tennis 2-3/7 days a week            Diabetes education visit: Most recent: 05/2012      CDE visit 10/17         Wt Readings from Last 3 Encounters:  03/05/17 191 lb (86.6 kg)  12/04/16 196 lb (88.9 kg)  10/23/16 195 lb (88.5 kg)    Lab Results  Component Value Date   HGBA1C 7.7 (H) 03/02/2017   HGBA1C 7.4 (H) 10/20/2016   HGBA1C 7.1 (H) 07/21/2016   Lab Results  Component Value Date   MICROALBUR 90.1 (H) 10/20/2016   LDLCALC 63 10/06/2016   CREATININE 0.97 12/01/2016    Other problems discussed today: See review of systems   Lab on 03/02/2017  Component Date Value Ref Range Status  . Hgb A1c MFr Bld 03/02/2017 7.7* 4.6 - 6.5 % Final   Glycemic Control Guidelines for People with Diabetes:Non Diabetic:  <6%Goal of Therapy: <7%Additional Action Suggested:  >8%   . Glucose, Bld 03/02/2017 72  70 - 99 mg/dL Final     Allergies as of 03/05/2017      Reactions   Actos [pioglitazone]    hairloss   Metformin And Related Diarrhea   Novocain [procaine]    syncopy   Sulfa Antibiotics Itching      Medication List       Accurate as of 03/05/17 11:59 PM. Always use your most recent med list.          acetaminophen 325 MG tablet Commonly known as:  TYLENOL Take 650 mg by mouth every 6 (six) hours as needed for mild pain.   aspirin 81 MG tablet Take 81 mg by mouth QID. PT STATES SHE TAKES 3-4 ASA QD   B-D UF III MINI PEN NEEDLES 31G X 5 MM Misc Generic drug:  Insulin Pen Needle USE 5 TIMES A DAY AS DIRECTED   Biotin 5000 MCG Caps Take 1 capsule by mouth 2 (two) times daily.   ezetimibe-simvastatin 10-40 MG tablet Commonly known as:  VYTORIN TAKE 1 TABLET BY MOUTH DAILY   glucose blood test strip Commonly known as:  ACCU-CHEK AVIVA PLUS Use as instructed to check blood sugar 3 times per day dx code E11.65   insulin lispro 100 UNIT/ML injection Commonly known as:  HUMALOG Use 66 units in V-go pump daily   levothyroxine 100 MCG tablet Commonly known as:  SYNTHROID, LEVOTHROID take 1 tablet by mouth once daily   losartan 100 MG tablet Commonly known as:  COZAAR take 1 tablet by mouth once daily   nitroGLYCERIN 0.4 MG SL tablet Commonly known as:  NITROSTAT Place 1 tablet (0.4 mg total) under the tongue every 5 (five) minutes as needed for chest pain.   omega-3 acid ethyl esters 1 g capsule Commonly known as:  LOVAZA Take 2 g by mouth 2 (two) times daily.   SUPER B COMPLEX/C Caps Take 1 tablet by mouth 2 (two) times daily.   V-GO 30 Kit USE ONCE PER DAY AS DIRECTED       Allergies:  Allergies  Allergen Reactions  . Actos [Pioglitazone]     hairloss  . Metformin And Related Diarrhea  . Novocain [Procaine]     syncopy  . Sulfa Antibiotics Itching    Past Medical History:  Diagnosis Date  . Arthritis   . Complication of anesthesia    takes along time to wake up   . Contact lens/glasses fitting    wears glasses or contacts  . Coronary artery disease 01/2007   s/p PCI of diagonal   . Diabetes mellitus without complication ([place])   . Dyslipidemia   . Hyperlipidemia   . Hypertension   . Hypothyroidism   . Snores   . Vertigo  Past Surgical History:  Procedure Laterality Date  . CARDIAC CATHETERIZATION  2008   stent  . SHOULDER ARTHROSCOPY WITH OPEN ROTATOR CUFF REPAIR Right 02/28/2013   Procedure: right shoulder arthroscopy, distal clavicle sculpting, open rotator cuff repair;  Surgeon: Cammie Sickle., MD;  Location: [facility];  Service: Orthopedics;  Laterality: Right;    Family History  Problem Relation Age of Onset  . Hypertension Father   . Heart attack Father     Social History:  reports that she has never smoked. She has never used smokeless tobacco. She reports that she drinks alcohol. She reports that she does not use drugs.  Review of Systems:  HYPERTENSION:  followed by PCP/cardiologist, blood pressure Is usually controlled on losartan 100 mg,   Home 135-75-80  BP Readings from Last 3 Encounters:  03/05/17 136/88  12/04/16 (!) 150/88  10/23/16 (!) 156/88    HYPERLIPIDEMIA: The lipid abnormality consists of elevated LDL, taking Vytorin, followed by cardiologist.   She was tried on Crestor but she thinks this caused leg pain and is again on Vytorin   Lab Results  Component Value Date   CHOL 135 10/06/2016   HDL 40 10/06/2016   LDLCALC 63 10/06/2016   LDLDIRECT 121.2 05/08/2013   TRIG 158 (H) 10/06/2016   CHOLHDL 3.4 10/06/2016    History of mild hypothyroidism, previously treated by PCP  She has previously required progressively higher doses of levothyroxine.  She is  taking 100 g daily since 11/2014  No recent fatigue  TSH recently consistently normal  Lab Results  Component Value Date   TSH 3.61 12/01/2016   TSH 3.26 07/21/2016   TSH 3.18 10/30/2015        Examination:   BP  136/88   Pulse 69   Ht 5' 6"  (1.676 m)   Wt 191 lb (86.6 kg)   SpO2 92%   BMI 30.83 kg/m   Body mass index is 30.83 kg/m.     ASSESSMENT/ PLAN:   Diabetes type 2 With BMI 31  See history of present illness for  discussion of current management, blood sugar patterns and problems identified  She has had difficulty getting consistent control of her diabetes However her A1c is 7.1 Most likely her improved control is related to better compliance with mealtime boluses using the V-go pump compared to Humalog injections. She also has started taking Victoza again which seems to be helping both fasting and some postprandial readings However her main difficulty is not getting readings controlled after supper and likely is not getting adequate insulin despite eating relatively smaller meals in the evenings She is still trying to exercise and keeping her weight stable, encouraged her to continue aerobic activity  Discussed needing to check blood sugars more often after supper to help adjust mealtime insulin Does not need to check blood sugar every single morning She needs to take at least 12-14 units bolus at suppertime to get readings at least under 180 at bedtime She will keep her 30 unit basal on the pump since recent fasting readings are coming down and may be better also with keeping blood sugars better later at night Encourage her to watch carbohydrates at all times Will change her Humalog prescription reflecting use of the full amount in the V-go pump daily of 66 units Sample of the insulin pumps given until next prescription is covered  LIPIDS: Needs follow-up  HYPOTHYROID: Adequately replaced 100 g of levothyroxine and will continue  INCREASED microalbumin:  This is mildly increased and blood pressure may not be adequately controlled, will forward this to PCP and cardiologist  Patient Instructions  Check blood sugars on waking up    Also check blood sugars about 2 hours after a  meal and do this after different meals by rotation  Recommended blood sugar levels on waking up is 90-130 and about 2 hours after meal is 130-160  Please bring your blood sugar monitor to each visit, thank you  Invokana 170m  Losartan 1/2 daily Counseling time on subjects discussed above is over 50% of today's 25 minute visit  Dominique Ressel 03/07/2017, 11:24 AM                                                  Patient ID: BDolores Patty female   DOB: 224-Nov-1952 66y.o.   MRN: [id number]  Reason for Appointment: Diabetes follow-up   History of Present Illness   Diagnosis: Type 2 DIABETES MELITUS, date of diagnosis:  2011   Past history: Her initial presentation with diabetes was with significant symptoms and glucose of 947 Initially was treated with insulin and subsequently on metformin which she could not tolerate  Since she was not responding to Victoza and had significant hyperglycemia she was started on basal bolus insulin regimen instead  However had required relatively large doses of insulin with inadequate control and blood sugars subsequently improved significantly with adding Victoza in 10/13  Her  A1c was 6.4 in 4/14 and she had overall good control with minimal hypoglycemia She was taken off  Afrezza because of her high out-of-pocket expense and she was tried on V-go pump which she had some difficulties getting this through the pharmacy  RECENT history:    Insulin regimen: V-go 30 units Humalog boluses 04-21-09 units, 3 times a day Non-insulin hypoglycemic drugs: Victoza 1.2 mg daily   She is on a regimen of basal bolus insulin regimen, using the V-go pump since 04/08/16  Her A1c has improved at 7.1, previously has been 7.5   Current blood sugar patterns and problems identified:  Although her A1c is better her blood sugars at home are recently significantly high and averaging about 178  She thinks that most of her sugars are high because of stress  FASTING blood  sugars at home are mostly high although late morning in the lab glucose was only 102  She also appears to have persistently high readings after supper, checking only a few readings towards bedtime.  She is concerned that her insurance is not covering her Humalog well since she is using up more than what her prescription is written for  She continues to like using the V-go which has helped her compliance with mealtime insulin boluses  VICTOZA: She had been told to try leaving this off on her last visit but recently she has gone back on it since she thinks blood sugars may have been higher without it.  She had been concerned about the cost of this previously  She is using a new monitor and the date and time are incorrect  Side effects from medications:  diarrhea from metformin,? Hair loss from Actos Proper timing of medications in relation to meals: Yes         Monitors blood glucose: Recently 1-2 times  a day.    Glucometer: One Touch ultra  mini .          Blood Glucose readings from monitor   Mean values apply above for all meters except median for One Touch  PRE-MEAL Fasting Lunch Dinner Bedtime Overall  Glucose range: 89-328  140- 156  159-178  146-296    Mean/median: 165    257  166     Hypoglycemia: None recently      Meals: 3 meals per day.  Breakfast: may have half a peanut better sandwich Or a granola bar.   Dinner at 6-8 pm, Recently dinner is a smaller meal than lunch          Physical activity: exercise: doing some walking and is trying to play tennis 2/7 days a week            Diabetes education visit: Most recent: 05/2012      CDE visit 10/17         Wt Readings from Last 3 Encounters:  03/05/17 191 lb (86.6 kg)  12/04/16 196 lb (88.9 kg)  10/23/16 195 lb (88.5 kg)    Lab Results  Component Value Date   HGBA1C 7.7 (H) 03/02/2017   HGBA1C 7.4 (H) 10/20/2016   HGBA1C 7.1 (H) 07/21/2016   Lab Results  Component Value Date   MICROALBUR 90.1 (H) 10/20/2016    LDLCALC 63 10/06/2016   CREATININE 0.97 12/01/2016    Other problems discussed today: See review of systems   Lab on 03/02/2017  Component Date Value Ref Range Status  . Hgb A1c MFr Bld 03/02/2017 7.7* 4.6 - 6.5 % Final   Glycemic Control Guidelines for People with Diabetes:Non Diabetic:  <6%Goal of Therapy: <7%Additional Action Suggested:  >8%   . Glucose, Bld 03/02/2017 72  70 - 99 mg/dL Final    Allergies as of 03/05/2017      Reactions   Actos [pioglitazone]    hairloss   Metformin And Related Diarrhea   Novocain [procaine]    syncopy   Sulfa Antibiotics Itching      Medication List       Accurate as of 03/05/17 11:59 PM. Always use your most recent med list.          acetaminophen 325 MG tablet Commonly known as:  TYLENOL Take 650 mg by mouth every 6 (six) hours as needed for mild pain.   aspirin 81 MG tablet Take 81 mg by mouth QID. PT STATES SHE TAKES 3-4 ASA QD   B-D UF III MINI PEN NEEDLES 31G X 5 MM Misc Generic drug:  Insulin Pen Needle USE 5 TIMES A DAY AS DIRECTED   Biotin 5000 MCG Caps Take 1 capsule by mouth 2 (two) times daily.   ezetimibe-simvastatin 10-40 MG tablet Commonly known as:  VYTORIN TAKE 1 TABLET BY MOUTH DAILY   glucose blood test strip Commonly known as:  ACCU-CHEK AVIVA PLUS Use as instructed to check blood sugar 3 times per day dx code E11.65   insulin lispro 100 UNIT/ML injection Commonly known as:  HUMALOG Use 66 units in V-go pump daily   levothyroxine 100 MCG tablet Commonly known as:  SYNTHROID, LEVOTHROID take 1 tablet by mouth once daily   losartan 100 MG tablet Commonly known as:  COZAAR take 1 tablet by mouth once daily   nitroGLYCERIN 0.4 MG SL tablet Commonly known as:  NITROSTAT Place 1 tablet (0.4 mg total) under the tongue every 5 (five) minutes as needed for chest pain.   omega-3 acid ethyl esters 1  g capsule Commonly known as:  LOVAZA Take 2 g by mouth 2 (two) times daily.   SUPER B COMPLEX/C  Caps Take 1 tablet by mouth 2 (two) times daily.   V-GO 30 Kit USE ONCE PER DAY AS DIRECTED       Allergies:  Allergies  Allergen Reactions  . Actos [Pioglitazone]     hairloss  . Metformin And Related Diarrhea  . Novocain [Procaine]     syncopy  . Sulfa Antibiotics Itching    Past Medical History:  Diagnosis Date  . Arthritis   . Complication of anesthesia    takes along time to wake up  . Contact lens/glasses fitting    wears glasses or contacts  . Coronary artery disease 01/2007   s/p PCI of diagonal   . Diabetes mellitus without complication ([place])   . Dyslipidemia   . Hyperlipidemia   . Hypertension   . Hypothyroidism   . Snores   . Vertigo     Past Surgical History:  Procedure Laterality Date  . CARDIAC CATHETERIZATION  2008   stent  . SHOULDER ARTHROSCOPY WITH OPEN ROTATOR CUFF REPAIR Right 02/28/2013   Procedure: right shoulder arthroscopy, distal clavicle sculpting, open rotator cuff repair;  Surgeon: Cammie Sickle., MD;  Location: [facility];  Service: Orthopedics;  Laterality: Right;    Family History  Problem Relation Age of Onset  . Hypertension Father   . Heart attack Father     Social History:  reports that she has never smoked. She has never used smokeless tobacco. She reports that she drinks alcohol. She reports that she does not use drugs.  Review of Systems:  HYPERTENSION:  followed by PCP/cardiologist, blood pressure Is usually controlled on losartan 100 mg, higher More recently No recent changes made by treating physicians   HYPERLIPIDEMIA: The lipid abnormality consists of elevated LDL, taking Vytorin, followed by cardiologist.   She was tried on Crestor but she thinks this caused leg pain and is on Vytorin   Lab Results  Component Value Date   CHOL 135 10/06/2016   HDL 40 10/06/2016   [place] 63 10/06/2016   LDLDIRECT 121.2 05/08/2013   TRIG 158 (H) 10/06/2016   CHOLHDL 3.4 10/06/2016    History of mild  hypothyroidism, previously treated by PCP  She has previously required progressively higher doses of levothyroxine.  She is  taking 100 g daily since 11/2014   TSH Is  consistently normal  Lab Results  Component Value Date   TSH 3.61 12/01/2016   TSH 3.26 07/21/2016   TSH 3.18 10/30/2015   NEUROPATHY: She has mild tingling in her feet, chronic and does not require medication for this     Examination:   BP 136/88   Pulse 69   Ht 5' 6"  (1.676 m)   Wt 191 lb (86.6 kg)   SpO2 92%   BMI 30.83 kg/m   Body mass index is 30.83 kg/m.     ASSESSMENT/ PLAN:   Diabetes type 2 With BMI 31  See history of present illness for  discussion of current management, blood sugar patterns and problems identified  She has had more difficulty with controlling her sugars with insulin regimen alone, both fasting and postprandial especially after supper Only with severe restriction of her carbohydrates she has blood sugars near target This is despite her trying to be active with exercise and recently losing a little weight She has had variable results with taking Victoza on the  past and had been concerned about the cost  She is a good candidate for medication like Invokana Discussed action of SGLT 2 drugs on lowering glucose by decreasing kidney absorption of glucose, benefits of weight loss and lower blood pressure, possible side effects including candidiasis and dosage regimen  She will start with 100 mg daily Discussed safety labeling on the medication but reminded her that this would be beneficial long-term for renal and cardiac protection Also she may need to check her sugars more consistently every day especially as she will need to adjust her boluses based on her response to Invokana  HYPOTHYROID: Adequately replaced previously and will need follow-up on the next visit  HYPERTENSION: This is not controlled and may improve with Invokana Again discussed the need for better controlled  especially with microalbuminuria She does need to follow-up with her cardiologist However to avoid any renal dysfunction and low normal readings we will reduce her losartan to half tablet for now with starting Invokana    Patient Instructions  Check blood sugars on waking up    Also check blood sugars about 2 hours after a meal and do this after different meals by rotation  Recommended blood sugar levels on waking up is 90-130 and about 2 hours after meal is 130-160  Please bring your blood sugar monitor to each visit, thank you  Invokana 155m  Losartan 1/2 daily    Tallyn Holroyd 03/07/2017, 11:24 AM   Counseling time on subjects discussed above is over 50% of today's 25 minute visit

## 2017-03-07 ENCOUNTER — Other Ambulatory Visit: Payer: Self-pay | Admitting: Endocrinology

## 2017-03-07 MED ORDER — CANAGLIFLOZIN 100 MG PO TABS: ORAL_TABLET | ORAL | 3 refills | Status: DC

## 2017-03-07 MED ORDER — INSULIN LISPRO 100 UNIT/ML ~~LOC~~ SOLN: SUBCUTANEOUS | 11 refills | Status: DC

## 2017-04-12 ENCOUNTER — Other Ambulatory Visit (INDEPENDENT_AMBULATORY_CARE_PROVIDER_SITE_OTHER): Payer: Medicare Other

## 2017-04-12 DIAGNOSIS — E1165 Type 2 diabetes mellitus with hyperglycemia: Secondary | ICD-10-CM

## 2017-04-12 DIAGNOSIS — Z794 Long term (current) use of insulin: Secondary | ICD-10-CM | POA: Diagnosis not present

## 2017-04-12 LAB — BASIC METABOLIC PANEL
BUN: 21 mg/dL (ref 6–23)
CO2: 26 mEq/L (ref 19–32)
CREATININE: 1.08 mg/dL (ref 0.40–1.20)
Calcium: 10 mg/dL (ref 8.4–10.5)
Chloride: 110 mEq/L (ref 96–112)
GFR: 53.55 mL/min — AB (ref >60.00)
Glucose, Bld: 113 mg/dL — ABNORMAL HIGH (ref 70–99)
POTASSIUM: 4.6 meq/L (ref 3.5–5.1)
Sodium: 143 mEq/L (ref 135–145)

## 2017-04-13 LAB — FRUCTOSAMINE: Fructosamine: 230 umol/L (ref 0–285)

## 2017-04-15 ENCOUNTER — Ambulatory Visit (INDEPENDENT_AMBULATORY_CARE_PROVIDER_SITE_OTHER): Payer: Medicare Other | Admitting: Endocrinology

## 2017-04-15 VITALS — BP 130/76 | HR 70 | Ht 66.0 in | Wt 184.0 lb

## 2017-04-15 DIAGNOSIS — E1165 Type 2 diabetes mellitus with hyperglycemia: Secondary | ICD-10-CM | POA: Diagnosis not present

## 2017-04-15 DIAGNOSIS — Z794 Long term (current) use of insulin: Secondary | ICD-10-CM

## 2017-04-15 NOTE — Progress Notes (Signed)
Patient ID: Crystal Hobbs, female   DOB: [birth year], 66 y.o.   MRN: [id number]   Reason for Appointment: Diabetes follow-up   History of Present Illness   Diagnosis: Type 2 DIABETES MELITUS, date of diagnosis:  2011   Past history: Her initial presentation with diabetes was with significant symptoms and glucose of 947 Initially was treated with insulin and subsequently on metformin which she could not tolerate  Since she was not responding to Victoza and had significant hyperglycemia she was started on basal bolus insulin regimen instead  However had required relatively large doses of insulin with inadequate control and blood sugars subsequently improved significantly with adding Victoza in 10/13  Her  A1c was 6.4 in 4/14 and she had overall good control with minimal hypoglycemia She was taken off  Afrezza because of her high out-of-pocket expense and she was tried on V-go pump which she had some difficulties getting this through the pharmacy  RECENT history:    Insulin regimen: V-go 30 units Humalog boluses 6 units, 3 times a day Non-insulin hypoglycemic drugs: Invokana 100 mg daily   She is on a regimen of basal bolus insulin regimen, using the V-go pump since 04/08/16  Her A1c has increased gradually over the last 3 measurements and now 7.7  Current blood sugar patterns and problems identified:  She has been on Invokana since late June when her blood sugars were more difficult to control except when she would severely restrict her   Also had fairly persistently high readings fasting and usually after supper  With starting Invokana blood sugars improved significantly and have been excellent and fairly consistent for the last month  Blood sugars in the mornings a fairly stable near normal and she has only occasional mild increase in postprandial readings which she has measured a few times, mostly after supper  She'll however complaining about frequent urination and  sometimes urgency with urination and has not taken the Invokana for the last 4 days without much change in her blood sugars  She has also cut back on her boluses down to 6 units instead of 14 at dinnertime and also taking 6 units at other meals mostly  Only occasionally when she is eating dessert that she will take 10 units  She is cutting back on her carbohydrates overall and calories and will supplement more with some snacks in between meals or occasionally at bedtime  Recently has not no hypoglycemia but probably about a month ago she may have had a glucose of 60 overnight when she had a very light meal in the evening  She is also exercising more regularly  Her weight is down 7 pounds  Side effects from medications:  diarrhea from metformin,? Hair loss from Actos Proper timing of medications in relation to meals: Yes         Monitors blood glucose: Recently 1-2 times  a day.    Glucometer: One Touch ultra mini .          Blood Glucose readings from monitor as   Mean values apply above for all meters except median for One Touch  PRE-MEAL Fasting Lunch Dinner Bedtime Overall  Glucose range: 92-145    107-170    Mean/median: 116  91  128  148 118+/-25     Hypoglycemia:  Meals: 3 meals per day.  She is trying to limit her calories to 1400 recently and largest meal has about 450 cal  Breakfast: may have half a peanut  better sandwich Or a granola bar.    Dinner at 6-8 pm        Physical activity: exercise: doing walking or is trying to play tennis 1-6/7 days a week            Diabetes education visit: Most recent: 05/2012      CDE visit 10/17         Wt Readings from Last 3 Encounters:  04/15/17 184 lb (83.5 kg)  03/05/17 191 lb (86.6 kg)  12/04/16 196 lb (88.9 kg)    Lab Results  Component Value Date   HGBA1C 7.7 (H) 03/02/2017   HGBA1C 7.4 (H) 10/20/2016   HGBA1C 7.1 (H) 07/21/2016   Lab Results  Component Value Date   MICROALBUR 90.1 (H) 10/20/2016   LDLCALC 63  10/06/2016   CREATININE 1.08 04/12/2017    Other problems discussed today: See review of systems   Lab on 04/12/2017  Component Date Value Ref Range Status  . Sodium 04/12/2017 143  135 - 145 mEq/L Final  . Potassium 04/12/2017 4.6  3.5 - 5.1 mEq/L Final  . Chloride 04/12/2017 110  96 - 112 mEq/L Final  . CO2 04/12/2017 26  19 - 32 mEq/L Final  . Glucose, Bld 04/12/2017 113* 70 - 99 mg/dL Final  . BUN 04/12/2017 21  6 - 23 mg/dL Final  . Creatinine, Ser 04/12/2017 1.08  0.40 - 1.20 mg/dL Final  . Calcium 04/12/2017 10.0  8.4 - 10.5 mg/dL Final  . GFR 04/12/2017 53.55* >60.00 mL/min Final  . Fructosamine 04/12/2017 230  0 - 285 umol/L Final   Comment: Published reference interval for apparently healthy subjects between age 71 and 66 is 81 - 285 umol/L and in a poorly controlled diabetic population is 228 - 563 umol/L with a mean of 396 umol/L.     Allergies as of 04/15/2017      Reactions   Actos [pioglitazone]    hairloss   Metformin And Related Diarrhea   Novocain [procaine]    syncopy   Sulfa Antibiotics Itching      Medication List       Accurate as of 04/15/17 12:55 PM. Always use your most recent med list.          acetaminophen 325 MG tablet Commonly known as:  TYLENOL Take 650 mg by mouth every 6 (six) hours as needed for mild pain.   aspirin 81 MG tablet Take 81 mg by mouth QID. PT STATES SHE TAKES 3-4 ASA QD   B-D UF III MINI PEN NEEDLES 31G X 5 MM Misc Generic drug:  Insulin Pen Needle USE 5 TIMES A DAY AS DIRECTED   Biotin 5000 MCG Caps Take 1 capsule by mouth 2 (two) times daily.   canagliflozin 100 MG Tabs tablet Commonly known as:  INVOKANA 1 tablet before breakfast   ezetimibe-simvastatin 10-40 MG tablet Commonly known as:  VYTORIN TAKE 1 TABLET BY MOUTH DAILY   glucose blood test strip Commonly known as:  ACCU-CHEK AVIVA PLUS Use as instructed to check blood sugar 3 times per day dx code E11.65   insulin lispro 100 UNIT/ML  injection Commonly known as:  HUMALOG Use 74 UNITS IN V-GO PUMP DAILY   levothyroxine 100 MCG tablet Commonly known as:  SYNTHROID, LEVOTHROID take 1 tablet by mouth once daily   losartan 100 MG tablet Commonly known as:  COZAAR take 1 tablet by mouth once daily   nitroGLYCERIN 0.4 MG SL tablet Commonly known as:  NITROSTAT Place 1 tablet (0.4 mg total) under the tongue every 5 (five) minutes as needed for chest pain.   omega-3 acid ethyl esters 1 g capsule Commonly known as:  LOVAZA Take 2 g by mouth 2 (two) times daily.   SUPER B COMPLEX/C Caps Take 1 tablet by mouth 2 (two) times daily.   V-GO 30 Kit USE ONCE PER DAY AS DIRECTED       Allergies:  Allergies  Allergen Reactions  . Actos [Pioglitazone]     hairloss  . Metformin And Related Diarrhea  . Novocain [Procaine]     syncopy  . Sulfa Antibiotics Itching    Past Medical History:  Diagnosis Date  . Arthritis   . Complication of anesthesia    takes along time to wake up  . Contact lens/glasses fitting    wears glasses or contacts  . Coronary artery disease 01/2007   s/p PCI of diagonal   . Diabetes mellitus without complication ([place])   . Dyslipidemia   . Hyperlipidemia   . Hypertension   . Hypothyroidism   . Snores   . Vertigo     Past Surgical History:  Procedure Laterality Date  . CARDIAC CATHETERIZATION  2008   stent  . SHOULDER ARTHROSCOPY WITH OPEN ROTATOR CUFF REPAIR Right 02/28/2013   Procedure: right shoulder arthroscopy, distal clavicle sculpting, open rotator cuff repair;  Surgeon: Cammie Sickle., MD;  Location: [place];  Service: Orthopedics;  Laterality: Right;    Family History  Problem Relation Age of Onset  . Hypertension Father   . Heart attack Father     Social History:  reports that she has never smoked. She has never used smokeless tobacco. She reports that she drinks alcohol. She reports that she does not use drugs.  Review of  Systems:  HYPERTENSION:  followed by PCP/cardiologist, blood pressure Is usually controlled on losartan 100 mg,   Home 130s-70  BP Readings from Last 3 Encounters:  04/15/17 130/76  03/05/17 136/88  12/04/16 (!) 150/88    HYPERLIPIDEMIA: The lipid abnormality consists of elevated LDL, taking Vytorin, followed by cardiologist.   She was tried on Crestor but she thinks this caused leg pain and is again on Vytorin   Lab Results  Component Value Date   CHOL 135 10/06/2016   HDL 40 10/06/2016   LDLCALC 63 10/06/2016   LDLDIRECT 121.2 05/08/2013   TRIG 158 (H) 10/06/2016   CHOLHDL 3.4 10/06/2016    History of mild hypothyroidism, previously treated by PCP  She has previously required progressively higher doses of levothyroxine.  She is  taking 100 g daily since 11/2014  No recent fatigue  TSH recently consistently normal  Lab Results  Component Value Date   TSH 3.61 12/01/2016   TSH 3.26 07/21/2016   TSH 3.18 10/30/2015        Examination:   BP 130/76   Pulse 70   Ht _0  (1.676 m)   Wt 184 lb (83.5 kg)   SpO2 97%   BMI 29.70 kg/m   Body mass index is 29.7 kg/m.     ASSESSMENT/ PLAN:   Diabetes type 2 With BMI 31  See history of present illness for  discussion of current management, blood sugar patterns and problems identified  She has had difficulty getting consistent control of her diabetes However her A1c is 7.1 Most likely her improved control is related to better compliance with mealtime boluses using the V-go pump compared to  Humalog injections. She also has started taking Victoza again which seems to be helping both fasting and some postprandial readings However her main difficulty is not getting readings controlled after supper and likely is not getting adequate insulin despite eating relatively smaller meals in the evenings She is still trying to exercise and keeping her weight stable, encouraged her to continue aerobic activity  Discussed  needing to check blood sugars more often after supper to help adjust mealtime insulin Does not need to check blood sugar every single morning She needs to take at least 12-14 units bolus at suppertime to get readings at least under 180 at bedtime She will keep her 30 unit basal on the pump since recent fasting readings are coming down and may be better also with keeping blood sugars better later at night Encourage her to watch carbohydrates at all times Will change her Humalog prescription reflecting use of the full amount in the V-go pump daily of 66 units Sample of the insulin pumps given until next prescription is covered  LIPIDS: Needs follow-up  HYPOTHYROID: Adequately replaced 100 g of levothyroxine and will continue  INCREASED microalbumin: This is mildly increased and blood pressure may not be adequately controlled, will forward this to PCP and cardiologist  There are no Patient Instructions on file for this visit.Counseling time on subjects discussed above is over 50% of today's 25 minute visit  Melesio Madara 04/15/2017, 12:55 PM                                                  Patient ID: Crystal Hobbs, female   DOB: Apr 17, 1951, 66 y.o.   MRN: [id number]   Reason for Appointment: Diabetes follow-up   History of Present Illness   Diagnosis: Type 2 DIABETES MELITUS, date of diagnosis:  2011   Past history: Her initial presentation with diabetes was with significant symptoms and glucose of 947 Initially was treated with insulin and subsequently on metformin which she could not tolerate  Since she was not responding to Victoza and had significant hyperglycemia she was started on basal bolus insulin regimen instead  However had required relatively large doses of insulin with inadequate control and blood sugars subsequently improved significantly with adding Victoza in 10/13  Her  A1c was 6.4 in 4/14 and she had overall good control with minimal hypoglycemia She was taken off   Afrezza because of her high out-of-pocket expense and she was tried on V-go pump which she had some difficulties getting this through the pharmacy  RECENT history:    Insulin regimen: V-go 30 units Humalog boluses 04-21-09 units, 3 times a day Non-insulin hypoglycemic drugs: Victoza 1.2 mg daily   She is on a regimen of basal bolus insulin regimen, using the V-go pump since 04/08/16  Her A1c has improved at 7.1, previously has been 7.5   Current blood sugar patterns and problems identified:  Although her A1c is better her blood sugars at home are recently significantly high and averaging about 178  She thinks that most of her sugars are high because of stress  FASTING blood sugars at home are mostly high although late morning in the lab glucose was only 102  She also appears to have persistently high readings after supper, checking only a few readings towards bedtime.  She is concerned that her insurance is not covering her  Humalog well since she is using up more than what her prescription is written for  She continues to like using the V-go which has helped her compliance with mealtime insulin boluses  VICTOZA: She had been told to try leaving this off on her last visit but recently she has gone back on it since she thinks blood sugars may have been higher without it.  She had been concerned about the cost of this previously  She is using a new monitor and the date and time are incorrect  Side effects from medications:  diarrhea from metformin,? Hair loss from Actos Proper timing of medications in relation to meals: Yes         Monitors blood glucose: Recently 1-2 times  a day.    Glucometer: One Touch ultra mini .          Blood Glucose readings from monitor   Mean values apply above for all meters except median for One Touch  PRE-MEAL Fasting Lunch Dinner Bedtime Overall  Glucose range: 89-328  140- 156  159-178  146-296    Mean/median: 165    257  166     Hypoglycemia: None  recently      Meals: 3 meals per day.  Breakfast: may have half a peanut better sandwich Or a granola bar.   Dinner at 6-8 pm, Recently dinner is a smaller meal than lunch          Physical activity: exercise: doing some walking and is trying to play tennis 2/7 days a week            Diabetes education visit: Most recent: 05/2012      CDE visit 10/17         Wt Readings from Last 3 Encounters:  04/15/17 184 lb (83.5 kg)  03/05/17 191 lb (86.6 kg)  12/04/16 196 lb (88.9 kg)    Lab Results  Component Value Date   HGBA1C 7.7 (H) 03/02/2017   HGBA1C 7.4 (H) 10/20/2016   HGBA1C 7.1 (H) 07/21/2016   Lab Results  Component Value Date   MICROALBUR 90.1 (H) 10/20/2016   LDLCALC 63 10/06/2016   CREATININE 1.08 04/12/2017    Other problems discussed today: See review of systems   Lab on 04/12/2017  Component Date Value Ref Range Status  . Sodium 04/12/2017 143  135 - 145 mEq/L Final  . Potassium 04/12/2017 4.6  3.5 - 5.1 mEq/L Final  . Chloride 04/12/2017 110  96 - 112 mEq/L Final  . CO2 04/12/2017 26  19 - 32 mEq/L Final  . Glucose, Bld 04/12/2017 113* 70 - 99 mg/dL Final  . BUN 04/12/2017 21  6 - 23 mg/dL Final  . Creatinine, Ser 04/12/2017 1.08  0.40 - 1.20 mg/dL Final  . Calcium 04/12/2017 10.0  8.4 - 10.5 mg/dL Final  . GFR 04/12/2017 53.55* >60.00 mL/min Final  . Fructosamine 04/12/2017 230  0 - 285 umol/L Final   Comment: Published reference interval for apparently healthy subjects between age 35 and 47 is 39 - 285 umol/L and in a poorly controlled diabetic population is 228 - 563 umol/L with a mean of 396 umol/L.     Allergies as of 04/15/2017      Reactions   Actos [pioglitazone]    hairloss   Metformin And Related Diarrhea   Novocain [procaine]    syncopy   Sulfa Antibiotics Itching      Medication List       Accurate as of  04/15/17 12:55 PM. Always use your most recent med list.          acetaminophen 325 MG tablet Commonly known as:  TYLENOL Take 650  mg by mouth every 6 (six) hours as needed for mild pain.   aspirin 81 MG tablet Take 81 mg by mouth QID. PT STATES SHE TAKES 3-4 ASA QD   B-D UF III MINI PEN NEEDLES 31G X 5 MM Misc Generic drug:  Insulin Pen Needle USE 5 TIMES A DAY AS DIRECTED   Biotin 5000 MCG Caps Take 1 capsule by mouth 2 (two) times daily.   canagliflozin 100 MG Tabs tablet Commonly known as:  INVOKANA 1 tablet before breakfast   ezetimibe-simvastatin 10-40 MG tablet Commonly known as:  VYTORIN TAKE 1 TABLET BY MOUTH DAILY   glucose blood test strip Commonly known as:  ACCU-CHEK AVIVA PLUS Use as instructed to check blood sugar 3 times per day dx code E11.65   insulin lispro 100 UNIT/ML injection Commonly known as:  HUMALOG Use 74 UNITS IN V-GO PUMP DAILY   levothyroxine 100 MCG tablet Commonly known as:  SYNTHROID, LEVOTHROID take 1 tablet by mouth once daily   losartan 100 MG tablet Commonly known as:  COZAAR take 1 tablet by mouth once daily   nitroGLYCERIN 0.4 MG SL tablet Commonly known as:  NITROSTAT Place 1 tablet (0.4 mg total) under the tongue every 5 (five) minutes as needed for chest pain.   omega-3 acid ethyl esters 1 g capsule Commonly known as:  LOVAZA Take 2 g by mouth 2 (two) times daily.   SUPER B COMPLEX/C Caps Take 1 tablet by mouth 2 (two) times daily.   V-GO 30 Kit USE ONCE PER DAY AS DIRECTED       Allergies:  Allergies  Allergen Reactions  . Actos [Pioglitazone]     hairloss  . Metformin And Related Diarrhea  . Novocain [Procaine]     syncopy  . Sulfa Antibiotics Itching    Past Medical History:  Diagnosis Date  . Arthritis   . Complication of anesthesia    takes along time to wake up  . Contact lens/glasses fitting    wears glasses or contacts  . Coronary artery disease 01/2007   s/p PCI of diagonal   . Diabetes mellitus without complication ([place])   . Dyslipidemia   . Hyperlipidemia   . Hypertension   . Hypothyroidism   . Snores   . Vertigo      Past Surgical History:  Procedure Laterality Date  . CARDIAC CATHETERIZATION  2008   stent  . SHOULDER ARTHROSCOPY WITH OPEN ROTATOR CUFF REPAIR Right 02/28/2013   Procedure: right shoulder arthroscopy, distal clavicle sculpting, open rotator cuff repair;  Surgeon: Cammie Sickle., MD;  Location: [place];  Service: Orthopedics;  Laterality: Right;    Family History  Problem Relation Age of Onset  . Hypertension Father   . Heart attack Father     Social History:  reports that she has never smoked. She has never used smokeless tobacco. She reports that she drinks alcohol. She reports that she does not use drugs.  Review of Systems:  HYPERTENSION:  followed by PCP/cardiologist, blood pressure Is usually controlled on losartan 100 mg She was told to take only a half a tablet with starting Invokana but she is still taking 1 tablet daily She thinks her blood pressure is in the 83M diastolic, previously over 80 usually No lightheadedness   HYPERLIPIDEMIA: The  lipid abnormality consists of elevated LDL, taking Vytorin, followed by cardiologist.   She was tried on Crestor but she thinks this caused leg pain and is on Vytorin   Lab Results  Component Value Date   CHOL 135 10/06/2016   HDL 40 10/06/2016   LDLCALC 63 10/06/2016   LDLDIRECT 121.2 05/08/2013   TRIG 158 (H) 10/06/2016   CHOLHDL 3.4 10/06/2016    History of mild hypothyroidism, previously treated by PCP  She is  taking 100 g daily since 11/2014   TSH Is  consistently normal  Lab Results  Component Value Date   TSH 3.61 12/01/2016   TSH 3.26 07/21/2016   TSH 3.18 10/30/2015        Examination:   BP 130/76   Pulse 70   Ht _0  (1.676 m)   Wt 184 lb (83.5 kg)   SpO2 97%   BMI 29.70 kg/m   Body mass index is 29.7 kg/m.     ASSESSMENT/ PLAN:   Diabetes type 2 With BMI 31  See history of present illness for  discussion of current management, blood sugar patterns and problems  identified  Her blood sugars are dramatically better with adding Invokana in June She has good readings in the morning and also usually after meals Also able to do better with her diet and has been able to cut back on her mealtime insulin coverage Has lost 7 pounds also  Although she thinks that her improvement is because of improved diet and regular exercise discussed that Invokana has helped her stabilize her blood sugars and promote weight loss Blood sugars are still fairly good this week without Invokana but discussed that she will get long-term benefits from this She is agreeable to trying a half tablet of the 100 mg daily since she is having excessive urination with the full tablet   HYPERTENSION: This is  better controlled with adding Invokana She will continue monitoring at home also    There are no Patient Instructions on file for this visit.   Ibraham Levi 04/15/2017, 12:55 PM

## 2017-06-09 ENCOUNTER — Telehealth: Payer: Self-pay | Admitting: Endocrinology

## 2017-06-09 ENCOUNTER — Other Ambulatory Visit: Payer: Self-pay

## 2017-06-09 ENCOUNTER — Other Ambulatory Visit: Payer: Self-pay | Admitting: Endocrinology

## 2017-06-09 MED ORDER — V-GO 30 KIT: PACK | 2 refills | Status: DC

## 2017-06-09 MED ORDER — GLUCOSE BLOOD VI STRP: ORAL_STRIP | 3 refills | Status: DC

## 2017-06-09 NOTE — Telephone Encounter (Signed)
Called patient and let her know that the VGo was sent to her pharmacy earlier and I have sent in a prescription for the One Touch Ultra strips also for her. I saw in her chart that she was using Accu-Chek but she stated that she is using the One Touch.

## 2017-06-09 NOTE — Telephone Encounter (Signed)
MEDICATION: Insulin Disposable Pump (V-GO 30) KIT                         ONE touch test strips rx [phone]  PHARMACY:   [id number] - [place], [place] - [address] AT [place] [phone] (Phone) [phone] (Fax)    IS THIS A 90 DAY SUPPLY : Y  IS PATIENT OUT OF MEDICATION: N (test strips) Y (v go 30)  IF NOT; HOW MUCH IS LEFT:   LAST APPOINTMENT DATE: 04/15/17  NEXT APPOINTMENT DATE: 08/02/17  OTHER COMMENTS: Patient stated the pharmacy called the office this morning to request refill. Please call patient and advise when filled. Patient stated she needs this filled today.    **Let patient know to contact pharmacy at the end of the day to make sure medication is ready. **  ** Please notify patient to allow 48-72 hours to process**  **Encourage patient to contact the pharmacy for refills or they can request refills through [facility]**

## 2017-06-09 NOTE — Telephone Encounter (Signed)
Routing to you °

## 2017-06-09 NOTE — Telephone Encounter (Signed)
MEDICATION: glucose blood (ONE TOUCH ULTRA TEST) test strip  PHARMACY:   [facility], [place] AT [place] [phone] (Phone) [phone] (Fax)    IS THIS A 90 DAY SUPPLY : yes  IS PATIENT OUT OF MEDICATION: unknown  IF NOT; HOW MUCH IS LEFT:   LAST APPOINTMENT DATE: @9 /26/2018  NEXT APPOINTMENT DATE:@Visit  date not found  OTHER COMMENTS:    **Let patient know to contact pharmacy at the end of the day to make sure medication is ready. **  ** Please notify patient to allow 48-72 hours to process**  **Encourage patient to contact the pharmacy for refills or they can request refills through [facility]**

## 2017-06-10 ENCOUNTER — Other Ambulatory Visit: Payer: Self-pay

## 2017-06-10 MED ORDER — GLUCOSE BLOOD VI STRP: ORAL_STRIP | 2 refills | Status: DC

## 2017-06-10 NOTE — Telephone Encounter (Signed)
This has been ordered 

## 2017-06-17 ENCOUNTER — Other Ambulatory Visit: Payer: Self-pay

## 2017-06-17 MED ORDER — GLUCOSE BLOOD VI STRP: ORAL_STRIP | 0 refills | Status: DC

## 2017-06-21 ENCOUNTER — Other Ambulatory Visit: Payer: Self-pay

## 2017-06-21 MED ORDER — GLUCOSE BLOOD VI STRP: ORAL_STRIP | 1 refills | Status: DC

## 2017-06-24 ENCOUNTER — Other Ambulatory Visit: Payer: Self-pay

## 2017-06-24 MED ORDER — LEVOTHYROXINE SODIUM 100 MCG PO TABS: 100.0000 ug | ORAL_TABLET | Freq: Every day | ORAL | 1 refills | Status: DC

## 2017-06-30 ENCOUNTER — Other Ambulatory Visit: Payer: Self-pay

## 2017-06-30 MED ORDER — LEVOTHYROXINE SODIUM 100 MCG PO TABS: 100.0000 ug | ORAL_TABLET | Freq: Every day | ORAL | 2 refills | Status: DC

## 2017-07-15 ENCOUNTER — Other Ambulatory Visit: Payer: Self-pay

## 2017-07-15 MED ORDER — CANAGLIFLOZIN 100 MG PO TABS: ORAL_TABLET | ORAL | 3 refills | Status: DC

## 2017-07-20 ENCOUNTER — Other Ambulatory Visit (INDEPENDENT_AMBULATORY_CARE_PROVIDER_SITE_OTHER): Payer: Medicare Other

## 2017-07-20 DIAGNOSIS — Z794 Long term (current) use of insulin: Secondary | ICD-10-CM

## 2017-07-20 DIAGNOSIS — E1165 Type 2 diabetes mellitus with hyperglycemia: Secondary | ICD-10-CM

## 2017-07-20 LAB — COMPREHENSIVE METABOLIC PANEL
ALBUMIN: 4.1 g/dL (ref 3.5–5.2)
ALT: 20 U/L (ref 0–35)
AST: 20 U/L (ref 0–37)
Alkaline Phosphatase: 89 U/L (ref 39–117)
BILIRUBIN TOTAL: 0.4 mg/dL (ref 0.2–1.2)
BUN: 23 mg/dL (ref 6–23)
CALCIUM: 10 mg/dL (ref 8.4–10.5)
CHLORIDE: 108 meq/L (ref 96–112)
CO2: 27 meq/L (ref 19–32)
CREATININE: 1.1 mg/dL (ref 0.40–1.20)
GFR: 52.39 mL/min — ABNORMAL LOW (ref >60.00)
Glucose, Bld: 116 mg/dL — ABNORMAL HIGH (ref 70–99)
Potassium: 4.7 mEq/L (ref 3.5–5.1)
SODIUM: 140 meq/L (ref 135–145)
Total Protein: 7.4 g/dL (ref 6.0–8.3)

## 2017-07-20 LAB — TSH: TSH: 2.38 u[IU]/mL (ref 0.35–4.50)

## 2017-07-20 LAB — HEMOGLOBIN A1C: HEMOGLOBIN A1C: 6.7 % — AB (ref 4.6–6.5)

## 2017-07-23 ENCOUNTER — Ambulatory Visit: Payer: Medicare Other | Admitting: Endocrinology

## 2017-07-23 ENCOUNTER — Encounter: Payer: Self-pay | Admitting: Endocrinology

## 2017-07-23 VITALS — BP 140/82 | HR 96 | Temp 97.9°F | Wt 183.2 lb

## 2017-07-23 DIAGNOSIS — Z794 Long term (current) use of insulin: Secondary | ICD-10-CM | POA: Diagnosis not present

## 2017-07-23 DIAGNOSIS — E1165 Type 2 diabetes mellitus with hyperglycemia: Secondary | ICD-10-CM

## 2017-07-23 DIAGNOSIS — Z23 Encounter for immunization: Secondary | ICD-10-CM

## 2017-07-23 DIAGNOSIS — I10 Essential (primary) hypertension: Secondary | ICD-10-CM | POA: Diagnosis not present

## 2017-07-23 DIAGNOSIS — E063 Autoimmune thyroiditis: Secondary | ICD-10-CM | POA: Diagnosis not present

## 2017-07-23 NOTE — Progress Notes (Addendum)
Patient ID: Crystal Hobbs, female   DOB: 06/26/1951, 66 y.o.   MRN: [id number]   Reason for Appointment: Diabetes follow-up   History of Present Illness   Diagnosis: Type 2 DIABETES MELITUS, date of diagnosis:  2011   Past history: Her initial presentation with diabetes was with significant symptoms and glucose of 947 Initially was treated with insulin and subsequently on metformin which she could not tolerate  Since she was not responding to Victoza and had significant hyperglycemia she was started on basal bolus insulin regimen instead  However had required relatively large doses of insulin with inadequate control and blood sugars subsequently improved significantly with adding Victoza in 10/13  Her  A1c was 6.4 in 4/14 and she had overall good control with minimal hypoglycemia She was taken off  Afrezza because of her high out-of-pocket expense and she was tried on V-go pump which she had some difficulties getting this through the pharmacy  RECENT history:    Insulin regimen: V-go 30 units Humalog boluses 6 units, 3 times a day Non-insulin hypoglycemic drugs: Invokana 100 mg daily   She is on a regimen of basal bolus insulin regimen, using the V-go pump since 04/08/16  Her A1c previously had increased gradually over the last 3 measurements and now much improved at 6.7, was 7.7  Current blood sugar patterns and problems identified:  She has been on 100 mg Invokana even though she was concerned about her symptoms of increased urination with this on her last visit and was told to try a half tablet  She has continued to have improved blood sugars with this  Also generally trying to watch her diet  She now says that at least once a week or so she may not be able to bolus while eating her evening meal because she is in a meeting  Also despite reminders she is checking blood sugars after meals very infrequently and none during the day  FASTING blood sugars are excellent and  fairly consistent, almost the same as on her last visit  Only once she had hypoglycemia after breakfast when she had a relatively higher protein meal  Breakfast may be somewhat variable but she does not adjust her bolus based on this and no monitoring after breakfast also  She is usually taking 6 units for covering her meals but if she is eating something like pasta she will take 10 units  Does have a few readings around 200 at night  She is also exercising regularly  Her weight is stable  Side effects from medications:  diarrhea from metformin,? Hair loss from Actos Proper timing of medications in relation to meals: Yes         Monitors blood glucose: Recently 1-2 times  a day.    Glucometer: One Touch ultra mini .          Blood Glucose readings from monitor:  Mean values apply above for all meters except median for One Touch  PRE-MEAL Fasting Lunch Dinner Bedtime Overall  Glucose range: 87-148  102  1 27-227    Mean/median: 120    165  122   Meals: 3 meals per day.  She is trying to limit her calories and carbohydrate  Breakfast: may have half a peanut better sandwich Or a granola bar.    Dinner at 6-8 pm        Physical activity: exercise: doing walking or is trying to play tennis 1-6/7 days a week  Diabetes education visit: Most recent: 05/2012      CDE visit 10/17         Wt Readings from Last 3 Encounters:  07/23/17 183 lb 4 oz (83.1 kg)  04/15/17 184 lb (83.5 kg)  03/05/17 191 lb (86.6 kg)    Lab Results  Component Value Date   HGBA1C 6.7 (H) 07/20/2017   HGBA1C 7.7 (H) 03/02/2017   HGBA1C 7.4 (H) 10/20/2016   Lab Results  Component Value Date   MICROALBUR 90.1 (H) 10/20/2016   LDLCALC 63 10/06/2016   CREATININE 1.10 07/20/2017    Other problems discussed today: See review of systems   Lab on 07/20/2017  Component Date Value Ref Range Status  . TSH 07/20/2017 2.38  0.35 - 4.50 uIU/mL Final  . Sodium 07/20/2017 140  135 - 145 mEq/L Final    . Potassium 07/20/2017 4.7  3.5 - 5.1 mEq/L Final  . Chloride 07/20/2017 108  96 - 112 mEq/L Final  . CO2 07/20/2017 27  19 - 32 mEq/L Final  . Glucose, Bld 07/20/2017 116* 70 - 99 mg/dL Final  . BUN 07/20/2017 23  6 - 23 mg/dL Final  . Creatinine, Ser 07/20/2017 1.10  0.40 - 1.20 mg/dL Final  . Total Bilirubin 07/20/2017 0.4  0.2 - 1.2 mg/dL Final  . Alkaline Phosphatase 07/20/2017 89  39 - 117 U/L Final  . AST 07/20/2017 20  0 - 37 U/L Final  . ALT 07/20/2017 20  0 - 35 U/L Final  . Total Protein 07/20/2017 7.4  6.0 - 8.3 g/dL Final  . Albumin 07/20/2017 4.1  3.5 - 5.2 g/dL Final  . Calcium 07/20/2017 10.0  8.4 - 10.5 mg/dL Final  . GFR 07/20/2017 52.39* >60.00 mL/min Final  . Hgb A1c MFr Bld 07/20/2017 6.7* 4.6 - 6.5 % Final   Glycemic Control Guidelines for People with Diabetes:Non Diabetic:  <6%Goal of Therapy: <7%Additional Action Suggested:  >8%     Allergies as of 07/23/2017      Reactions   Actos [pioglitazone]    hairloss   Metformin And Related Diarrhea   Novocain [procaine]    syncopy   Sulfa Antibiotics Itching      Medication List        Accurate as of 07/23/17  2:56 PM. Always use your most recent med list.          aspirin 81 MG tablet Take 81 mg by mouth QID. PT STATES SHE TAKES 3-4 ASA QD   Biotin 5000 MCG Caps Take 1 capsule by mouth 2 (two) times daily.   canagliflozin 100 MG Tabs tablet Commonly known as:  INVOKANA 1 tablet before breakfast   ezetimibe-simvastatin 10-40 MG tablet Commonly known as:  VYTORIN TAKE 1 TABLET BY MOUTH DAILY   glucose blood test strip Commonly known as:  ACCU-CHEK AVIVA PLUS Use as instructed to check blood sugar 3 times per day dx code E11.65   glucose blood test strip Commonly known as:  ONE TOUCH ULTRA TEST Use as instructed to check blood sugar 3 times per day dx code E11.65   insulin lispro 100 UNIT/ML injection Commonly known as:  HUMALOG Use 74 UNITS IN V-GO PUMP DAILY   levothyroxine 100 MCG  tablet Commonly known as:  SYNTHROID, LEVOTHROID Take 1 tablet (100 mcg total) by mouth daily.   losartan 100 MG tablet Commonly known as:  COZAAR take 1 tablet by mouth once daily   nitroGLYCERIN 0.4 MG SL tablet Commonly known as:  NITROSTAT Place 1 tablet (0.4 mg total) under the tongue every 5 (five) minutes as needed for chest pain.   omega-3 acid ethyl esters 1 g capsule Commonly known as:  LOVAZA Take 2 g by mouth 2 (two) times daily.   SUPER B COMPLEX/C Caps Take 1 tablet by mouth 2 (two) times daily.   V-GO 30 Kit USE ONCE PER DAY AS DIRECTED       Allergies:  Allergies  Allergen Reactions  . Actos [Pioglitazone]     hairloss  . Metformin And Related Diarrhea  . Novocain [Procaine]     syncopy  . Sulfa Antibiotics Itching    Past Medical History:  Diagnosis Date  . Arthritis   . Complication of anesthesia    takes along time to wake up  . Contact lens/glasses fitting    wears glasses or contacts  . Coronary artery disease 01/2007   s/p PCI of diagonal   . Diabetes mellitus without complication ([place])   . Dyslipidemia   . Hyperlipidemia   . Hypertension   . Hypothyroidism   . Snores   . Vertigo     Past Surgical History:  Procedure Laterality Date  . CARDIAC CATHETERIZATION  2008   stent    Family History  Problem Relation Age of Onset  . Hypertension Father   . Heart attack Father     Social History:  reports that  has never smoked. she has never used smokeless tobacco. She reports that she drinks alcohol. She reports that she does not use drugs.  Review of Systems:  HYPERTENSION:  followed by PCP/cardiologist, blood pressure Is usually controlled on losartan 100 mg,   Home ?  BP Readings from Last 3 Encounters:  07/23/17 140/82  04/15/17 130/76  03/05/17 136/88    HYPERLIPIDEMIA: The lipid abnormality consists of elevated LDL, taking Vytorin, followed by cardiologist.   She was tried on Crestor but she thinks this caused leg  pain and is again on Vytorin   Lab Results  Component Value Date   CHOL 135 10/06/2016   HDL 40 10/06/2016   LDLCALC 63 10/06/2016   LDLDIRECT 121.2 05/08/2013   TRIG 158 (H) 10/06/2016   CHOLHDL 3.4 10/06/2016    History of mild hypothyroidism, previously treated by PCP  She has previously required progressively higher doses of levothyroxine.  She is  taking 100 g daily since 11/2014  No recent fatigue  TSH recently consistently normal  Lab Results  Component Value Date   TSH 2.38 07/20/2017   TSH 3.61 12/01/2016   TSH 3.26 07/21/2016        Examination:   BP 140/82   Pulse 96   Temp 97.9 F (36.6 C) (Oral)   Wt 183 lb 4 oz (83.1 kg)   SpO2 (!) 57%   BMI 29.58 kg/m   Body mass index is 29.58 kg/m.     ASSESSMENT/ PLAN:   Diabetes type 2 With BMI 31  See history of present illness for  discussion of current management, blood sugar patterns and problems identified  She has had difficulty getting consistent control of her diabetes However her A1c is 7.1 Most likely her improved control is related to better compliance with mealtime boluses using the V-go pump compared to Humalog injections. She also has started taking Victoza again which seems to be helping both fasting and some postprandial readings However her main difficulty is not getting readings controlled after supper and likely is not getting adequate insulin despite eating  relatively smaller meals in the evenings She is still trying to exercise and keeping her weight stable, encouraged her to continue aerobic activity  Discussed needing to check blood sugars more often after supper to help adjust mealtime insulin Does not need to check blood sugar every single morning She needs to take at least 12-14 units bolus at suppertime to get readings at least under 180 at bedtime She will keep her 30 unit basal on the pump since recent fasting readings are coming down and may be better also with keeping blood  sugars better later at night Encourage her to watch carbohydrates at all times Will change her Humalog prescription reflecting use of the full amount in the V-go pump daily of 66 units Sample of the insulin pumps given until next prescription is covered  LIPIDS: Needs follow-up  HYPOTHYROID: Adequately replaced 100 g of levothyroxine and will continue  INCREASED microalbumin: This is mildly increased and blood pressure may not be adequately controlled, will forward this to PCP and cardiologist  There are no Patient Instructions on file for this visit.Counseling time on subjects discussed above is over 50% of today's 25 minute visit  Pervis Macintyre 07/23/2017, 2:56 PM                                                  Patient ID: Crystal Hobbs, female   DOB: 11-19-1950, 66 y.o.   MRN: [id number]   Reason for Appointment: Diabetes follow-up   History of Present Illness   Diagnosis: Type 2 DIABETES MELITUS, date of diagnosis:  2011   Past history: Her initial presentation with diabetes was with significant symptoms and glucose of 947 Initially was treated with insulin and subsequently on metformin which she could not tolerate  Since she was not responding to Victoza and had significant hyperglycemia she was started on basal bolus insulin regimen instead  However had required relatively large doses of insulin with inadequate control and blood sugars subsequently improved significantly with adding Victoza in 10/13  Her  A1c was 6.4 in 4/14 and she had overall good control with minimal hypoglycemia She was taken off  Afrezza because of her high out-of-pocket expense and she was tried on V-go pump which she had some difficulties getting this through the pharmacy  RECENT history:    Insulin regimen: V-go 30 units Humalog boluses 04-21-09 units, 3 times a day Non-insulin hypoglycemic drugs: Victoza 1.2 mg daily   She is on a regimen of basal bolus insulin regimen, using the V-go pump since  04/08/16  Her A1c has improved at 7.1, previously has been 7.5   Current blood sugar patterns and problems identified:  Although her A1c is better her blood sugars at home are recently significantly high and averaging about 178  She thinks that most of her sugars are high because of stress  FASTING blood sugars at home are mostly high although late morning in the lab glucose was only 102  She also appears to have persistently high readings after supper, checking only a few readings towards bedtime.  She is concerned that her insurance is not covering her Humalog well since she is using up more than what her prescription is written for  She continues to like using the V-go which has helped her compliance with mealtime insulin boluses  VICTOZA: She had been told to try  leaving this off on her last visit but recently she has gone back on it since she thinks blood sugars may have been higher without it.  She had been concerned about the cost of this previously  She is using a new monitor and the date and time are incorrect  Side effects from medications:  diarrhea from metformin,? Hair loss from Actos Proper timing of medications in relation to meals: Yes         Monitors blood glucose: Recently 1-2 times  a day.    Glucometer: One Touch ultra mini .          Blood Glucose readings from monitor   Mean values apply above for all meters except median for One Touch  PRE-MEAL Fasting Lunch Dinner Bedtime Overall  Glucose range: 89-328  140- 156  159-178  146-296    Mean/median: 165    257  166     Hypoglycemia: None recently      Meals: 3 meals per day.  Breakfast: may have half a peanut better sandwich Or a granola bar.   Dinner at 6-8 pm, Recently dinner is a smaller meal than lunch          Physical activity: exercise: doing some walking and is trying to play tennis 2/7 days a week            Diabetes education visit: Most recent: 05/2012      CDE visit 10/17         Wt Readings  from Last 3 Encounters:  07/23/17 183 lb 4 oz (83.1 kg)  04/15/17 184 lb (83.5 kg)  03/05/17 191 lb (86.6 kg)    Lab Results  Component Value Date   HGBA1C 6.7 (H) 07/20/2017   HGBA1C 7.7 (H) 03/02/2017   HGBA1C 7.4 (H) 10/20/2016   Lab Results  Component Value Date   MICROALBUR 90.1 (H) 10/20/2016   LDLCALC 63 10/06/2016   CREATININE 1.10 07/20/2017    Other problems discussed today: See review of systems   Lab on 07/20/2017  Component Date Value Ref Range Status  . TSH 07/20/2017 2.38  0.35 - 4.50 uIU/mL Final  . Sodium 07/20/2017 140  135 - 145 mEq/L Final  . Potassium 07/20/2017 4.7  3.5 - 5.1 mEq/L Final  . Chloride 07/20/2017 108  96 - 112 mEq/L Final  . CO2 07/20/2017 27  19 - 32 mEq/L Final  . Glucose, Bld 07/20/2017 116* 70 - 99 mg/dL Final  . BUN 07/20/2017 23  6 - 23 mg/dL Final  . Creatinine, Ser 07/20/2017 1.10  0.40 - 1.20 mg/dL Final  . Total Bilirubin 07/20/2017 0.4  0.2 - 1.2 mg/dL Final  . Alkaline Phosphatase 07/20/2017 89  39 - 117 U/L Final  . AST 07/20/2017 20  0 - 37 U/L Final  . ALT 07/20/2017 20  0 - 35 U/L Final  . Total Protein 07/20/2017 7.4  6.0 - 8.3 g/dL Final  . Albumin 07/20/2017 4.1  3.5 - 5.2 g/dL Final  . Calcium 07/20/2017 10.0  8.4 - 10.5 mg/dL Final  . GFR 07/20/2017 52.39* >60.00 mL/min Final  . Hgb A1c MFr Bld 07/20/2017 6.7* 4.6 - 6.5 % Final   Glycemic Control Guidelines for People with Diabetes:Non Diabetic:  <6%Goal of Therapy: <7%Additional Action Suggested:  >8%     Allergies as of 07/23/2017      Reactions   Actos [pioglitazone]    hairloss   Metformin And Related Diarrhea   Novocain [procaine]  syncopy   Sulfa Antibiotics Itching      Medication List        Accurate as of 07/23/17  2:56 PM. Always use your most recent med list.          aspirin 81 MG tablet Take 81 mg by mouth QID. PT STATES SHE TAKES 3-4 ASA QD   Biotin 5000 MCG Caps Take 1 capsule by mouth 2 (two) times daily.   canagliflozin 100 MG  Tabs tablet Commonly known as:  INVOKANA 1 tablet before breakfast   ezetimibe-simvastatin 10-40 MG tablet Commonly known as:  VYTORIN TAKE 1 TABLET BY MOUTH DAILY   glucose blood test strip Commonly known as:  ACCU-CHEK AVIVA PLUS Use as instructed to check blood sugar 3 times per day dx code E11.65   glucose blood test strip Commonly known as:  ONE TOUCH ULTRA TEST Use as instructed to check blood sugar 3 times per day dx code E11.65   insulin lispro 100 UNIT/ML injection Commonly known as:  HUMALOG Use 74 UNITS IN V-GO PUMP DAILY   levothyroxine 100 MCG tablet Commonly known as:  SYNTHROID, LEVOTHROID Take 1 tablet (100 mcg total) by mouth daily.   losartan 100 MG tablet Commonly known as:  COZAAR take 1 tablet by mouth once daily   nitroGLYCERIN 0.4 MG SL tablet Commonly known as:  NITROSTAT Place 1 tablet (0.4 mg total) under the tongue every 5 (five) minutes as needed for chest pain.   omega-3 acid ethyl esters 1 g capsule Commonly known as:  LOVAZA Take 2 g by mouth 2 (two) times daily.   SUPER B COMPLEX/C Caps Take 1 tablet by mouth 2 (two) times daily.   V-GO 30 Kit USE ONCE PER DAY AS DIRECTED       Allergies:  Allergies  Allergen Reactions  . Actos [Pioglitazone]     hairloss  . Metformin And Related Diarrhea  . Novocain [Procaine]     syncopy  . Sulfa Antibiotics Itching    Past Medical History:  Diagnosis Date  . Arthritis   . Complication of anesthesia    takes along time to wake up  . Contact lens/glasses fitting    wears glasses or contacts  . Coronary artery disease 01/2007   s/p PCI of diagonal   . Diabetes mellitus without complication ([place])   . Dyslipidemia   . Hyperlipidemia   . Hypertension   . Hypothyroidism   . Snores   . Vertigo     Past Surgical History:  Procedure Laterality Date  . CARDIAC CATHETERIZATION  2008   stent    Family History  Problem Relation Age of Onset  . Hypertension Father   . Heart attack  Father     Social History:  reports that  has never smoked. she has never used smokeless tobacco. She reports that she drinks alcohol. She reports that she does not use drugs.  Review of Systems:  HYPERTENSION:  followed by PCP/cardiologist, blood pressure Is usually controlled on losartan 100 mg She doesn't monitor blood pressure periodically at home and is not usually high  HYPERLIPIDEMIA: The lipid abnormality consists of elevated LDL, taking Vytorin, followed by cardiologist.   She was tried on Crestor but she thinks this caused leg pain and is on Vytorin She will discuss her management with cardiologist on her follow-up   Lab Results  Component Value Date   CHOL 135 10/06/2016   HDL 40 10/06/2016   LDLCALC 63 10/06/2016   LDLDIRECT  121.2 05/08/2013   TRIG 158 (H) 10/06/2016   CHOLHDL 3.4 10/06/2016    History of mild hypothyroidism, previously treated by PCP  She is  taking 100 g daily since 11/2014   TSH Is  consistently normal  Lab Results  Component Value Date   TSH 2.38 07/20/2017   TSH 3.61 12/01/2016   TSH 3.26 07/21/2016        Examination:   BP 140/82   Pulse 96   Temp 97.9 F (36.6 C) (Oral)   Wt 183 lb 4 oz (83.1 kg)   SpO2 (!) 57%   BMI 29.58 kg/m   Body mass index is 29.58 kg/m.     ASSESSMENT/ PLAN:   Diabetes type 2 With BMI 31  See history of present illness for  discussion of current management, blood sugar patterns and problems identified  A1c is excellent now at 6.7 which is better than usual Her blood sugars are consistently controlled with adding Invokana 100 mg in June She has very stable readings in the morning averaging about 120 However she is not doing enough readings after meals as directed and again this was emphasized  She may need to adjust her boluses based on her postprandial readings especially at supper and also at breakfast Discussed that if she is concerned about polyuria with Invokana she can try taking a half a  tablet Discussed postprandial blood sugar targets  Also discussed option using the Omnipod pump since currently she has difficulty bolusing while eating a meal during meetings using the V-go pump.  Discussed details about the Omnipod usage and she will look into this and let us know if she wants to proceed Also discussed potential for better adjustment of boluses using the Omnipod and adjustment of basal rate if needed at different times.  Lipids to be checked by cardiologist  HYPERTENSION: Blood pressure is high normal, she may be anxious and she does need to be again followed up by her cardiologist  Influenza vaccine given  There are no Patient Instructions on file for this visit.   Saia Derossett 07/23/2017, 2:56 PM    Counseling time on subjects discussed in assessment and plan sections is over 50% of today's 25 minute visit

## 2017-08-13 ENCOUNTER — Other Ambulatory Visit: Payer: Self-pay | Admitting: Cardiology

## 2017-10-09 ENCOUNTER — Other Ambulatory Visit: Payer: Self-pay | Admitting: Endocrinology

## 2017-10-11 ENCOUNTER — Other Ambulatory Visit: Payer: Self-pay | Admitting: Cardiology

## 2017-10-15 ENCOUNTER — Ambulatory Visit: Payer: Medicare Other | Admitting: Cardiology

## 2017-10-19 ENCOUNTER — Other Ambulatory Visit (INDEPENDENT_AMBULATORY_CARE_PROVIDER_SITE_OTHER): Payer: Medicare Other

## 2017-10-19 DIAGNOSIS — Z794 Long term (current) use of insulin: Secondary | ICD-10-CM | POA: Diagnosis not present

## 2017-10-19 DIAGNOSIS — E1165 Type 2 diabetes mellitus with hyperglycemia: Secondary | ICD-10-CM

## 2017-10-19 LAB — BASIC METABOLIC PANEL
BUN: 20 mg/dL (ref 6–23)
CALCIUM: 9.6 mg/dL (ref 8.4–10.5)
CO2: 27 meq/L (ref 19–32)
Chloride: 109 mEq/L (ref 96–112)
Creatinine, Ser: 1.03 mg/dL (ref 0.40–1.20)
GFR: 56.48 mL/min — AB (ref >60.00)
GLUCOSE: 110 mg/dL — AB (ref 70–99)
POTASSIUM: 4.3 meq/L (ref 3.5–5.1)
SODIUM: 141 meq/L (ref 135–145)

## 2017-10-19 LAB — HEMOGLOBIN A1C: Hgb A1c MFr Bld: 6.9 % — ABNORMAL HIGH (ref 4.6–6.5)

## 2017-10-19 LAB — MICROALBUMIN / CREATININE URINE RATIO
Creatinine,U: 149.5 mg/dL
MICROALB UR: 59.6 mg/dL — AB (ref 0.0–1.9)
MICROALB/CREAT RATIO: 39.9 mg/g — AB (ref 0.0–30.0)

## 2017-10-22 ENCOUNTER — Encounter: Payer: Self-pay | Admitting: Endocrinology

## 2017-10-22 ENCOUNTER — Ambulatory Visit: Payer: Medicare Other | Admitting: Endocrinology

## 2017-10-22 VITALS — BP 132/90 | HR 69 | Ht 66.0 in | Wt 188.4 lb

## 2017-10-22 DIAGNOSIS — E1165 Type 2 diabetes mellitus with hyperglycemia: Secondary | ICD-10-CM

## 2017-10-22 DIAGNOSIS — E1129 Type 2 diabetes mellitus with other diabetic kidney complication: Secondary | ICD-10-CM | POA: Diagnosis not present

## 2017-10-22 DIAGNOSIS — I10 Essential (primary) hypertension: Secondary | ICD-10-CM | POA: Diagnosis not present

## 2017-10-22 DIAGNOSIS — Z794 Long term (current) use of insulin: Secondary | ICD-10-CM | POA: Diagnosis not present

## 2017-10-22 DIAGNOSIS — E063 Autoimmune thyroiditis: Secondary | ICD-10-CM | POA: Diagnosis not present

## 2017-10-22 DIAGNOSIS — R809 Proteinuria, unspecified: Secondary | ICD-10-CM | POA: Diagnosis not present

## 2017-10-22 NOTE — Patient Instructions (Signed)
More sugars after meals

## 2017-10-22 NOTE — Progress Notes (Signed)
Patient ID: Crystal Hobbs, female   DOB: Nov 30, 1950, 67 y.o.   MRN: [id number]   Reason for Appointment: Diabetes follow-up   History of Present Illness   Diagnosis: Type 2 DIABETES MELITUS, date of diagnosis:  2011   Past history: Her initial presentation with diabetes was with significant symptoms and glucose of 947 Initially was treated with insulin and subsequently on metformin which she could not tolerate  Since she was not responding to Victoza and had significant hyperglycemia she was started on basal bolus insulin regimen instead  However had required relatively large doses of insulin with inadequate control and blood sugars subsequently improved significantly with adding Victoza in 10/13  Her  A1c was 6.4 in 4/14 and she had overall good control with minimal hypoglycemia She was taken off  Afrezza because of her high out-of-pocket expense and she was tried on V-go pump which she had some difficulties getting this through the pharmacy  RECENT history:    Insulin regimen: V-go 30 units Humalog boluses 6 units, 3 times a day Non-insulin hypoglycemic drugs: Invokana 100 mg daily   She is on a regimen of basal bolus insulin regimen, using the V-go pump since 04/08/16  Her A1c 6.9 compared to 6.7 previously and again better than in the past  Current blood sugar patterns and problems identified:  She has been again forgetting to check her blood sugars after meals despite repeated instructions  She is not adjusting her suppertime bolus enough based on what she is planning to eat and she may occasionally have high readings also  She thinks she does better when she takes extra boluses at bedtime when blood sugars are high and morning sugars are improved, however this is not apparent on her download  Also does not check her blood sugars after lunch, has one good reading after breakfast  She has had some intermittent exercise  Also with celebrations she has not been on  consistent diet over the last couple of months  She does continue to take Invokana without side effects  FASTING blood sugars are fluctuating somewhat but about 129 on an average  Recently has been trying to get back into exercise and her weight has started coming down this from last month  She still has gained about 5 pounds since her last visit  Side effects from medications:  diarrhea from metformin,? Hair loss from Actos Proper timing of medications in relation to meals: Yes         Monitors blood glucose: Recently 1-2 times  a day.    Glucometer: One Touch ultra mini .          Blood Glucose readings from monitor:  Mean values apply above for all meters except median for One Touch  PRE-MEAL Fasting Lunch Dinner Bedtime Overall  Glucose range:  96-159   129  123-219  89-219  Mean/median:      129   Meals: 3 meals per day.  She is trying to limit her calories and carbohydrate  Breakfast: may have half a peanut better sandwich or a granola bar.     Dinner at 6-8 pm        Physical activity: exercise: doing walking or is trying to play tennis 1-2/7 days a week            Diabetes education visit: Most recent: 05/2012      CDE visit 10/17         Wt Readings from Last 3  Encounters:  10/22/17 188 lb 6.4 oz (85.5 kg)  07/23/17 183 lb 4 oz (83.1 kg)  04/15/17 184 lb (83.5 kg)    Lab Results  Component Value Date   HGBA1C 6.9 (H) 10/19/2017   HGBA1C 6.7 (H) 07/20/2017   HGBA1C 7.7 (H) 03/02/2017   Lab Results  Component Value Date   MICROALBUR 59.6 (H) 10/19/2017   LDLCALC 63 10/06/2016   CREATININE 1.03 10/19/2017    Other problems discussed today: See review of systems   Lab on 10/19/2017  Component Date Value Ref Range Status  . Microalb, Ur 10/19/2017 59.6* 0.0 - 1.9 mg/dL Final  . Creatinine,U 10/19/2017 149.5  mg/dL Final  . Microalb Creat Ratio 10/19/2017 39.9* 0.0 - 30.0 mg/g Final  . Sodium 10/19/2017 141  135 - 145 mEq/L Final  . Potassium 10/19/2017  4.3  3.5 - 5.1 mEq/L Final  . Chloride 10/19/2017 109  96 - 112 mEq/L Final  . CO2 10/19/2017 27  19 - 32 mEq/L Final  . Glucose, Bld 10/19/2017 110* 70 - 99 mg/dL Final  . BUN 10/19/2017 20  6 - 23 mg/dL Final  . Creatinine, Ser 10/19/2017 1.03  0.40 - 1.20 mg/dL Final  . Calcium 10/19/2017 9.6  8.4 - 10.5 mg/dL Final  . GFR 10/19/2017 56.48* >60.00 mL/min Final  . Hgb A1c MFr Bld 10/19/2017 6.9* 4.6 - 6.5 % Final   Glycemic Control Guidelines for People with Diabetes:Non Diabetic:  <6%Goal of Therapy: <7%Additional Action Suggested:  >8%     Allergies as of 10/22/2017      Reactions   Actos [pioglitazone]    hairloss   Metformin And Related Diarrhea   Novocain [procaine]    syncopy   Sulfa Antibiotics Itching      Medication List        Accurate as of 10/22/17  9:36 AM. Always use your most recent med list.          aspirin 81 MG tablet Take 81 mg by mouth QID. PT STATES SHE TAKES 3-4 ASA QD   Biotin 5000 MCG Caps Take 1 capsule by mouth 2 (two) times daily.   canagliflozin 100 MG Tabs tablet Commonly known as:  INVOKANA 1 tablet before breakfast   ezetimibe-simvastatin 10-40 MG tablet Commonly known as:  VYTORIN Take 1 tablet by mouth daily. Please keep upcoming appt in February. Thank you   glucose blood test strip Commonly known as:  ACCU-CHEK AVIVA PLUS Use as instructed to check blood sugar 3 times per day dx code E11.65   glucose blood test strip Commonly known as:  ONE TOUCH ULTRA TEST Use as instructed to check blood sugar 3 times per day dx code E11.65   insulin lispro 100 UNIT/ML injection Commonly known as:  HUMALOG Use 74 UNITS IN V-GO PUMP DAILY   levothyroxine 100 MCG tablet Commonly known as:  SYNTHROID, LEVOTHROID Take 1 tablet (100 mcg total) by mouth daily.   losartan 100 MG tablet Commonly known as:  COZAAR TAKE 1 TABLET BY MOUTH ONCE DAILY   nitroGLYCERIN 0.4 MG SL tablet Commonly known as:  NITROSTAT Place 1 tablet (0.4 mg total)  under the tongue every 5 (five) minutes as needed for chest pain.   omega-3 acid ethyl esters 1 g capsule Commonly known as:  LOVAZA Take 2 g by mouth 2 (two) times daily.   SUPER B COMPLEX/C Caps Take 1 tablet by mouth 2 (two) times daily.   V-GO 30 Kit INJECT ONCE DAILY AS  DIRECTED       Allergies:  Allergies  Allergen Reactions  . Actos [Pioglitazone]     hairloss  . Metformin And Related Diarrhea  . Novocain [Procaine]     syncopy  . Sulfa Antibiotics Itching    Past Medical History:  Diagnosis Date  . Arthritis   . Complication of anesthesia    takes along time to wake up  . Contact lens/glasses fitting    wears glasses or contacts  . Coronary artery disease 01/2007   s/p PCI of diagonal   . Diabetes mellitus without complication ([place])   . Dyslipidemia   . Hyperlipidemia   . Hypertension   . Hypothyroidism   . Snores   . Vertigo     Past Surgical History:  Procedure Laterality Date  . CARDIAC CATHETERIZATION  2008   stent  . SHOULDER ARTHROSCOPY WITH OPEN ROTATOR CUFF REPAIR Right 02/28/2013   Procedure: right shoulder arthroscopy, distal clavicle sculpting, open rotator cuff repair;  Surgeon: Cammie Sickle., MD;  Location: [facility];  Service: Orthopedics;  Laterality: Right;    Family History  Problem Relation Age of Onset  . Hypertension Father   . Heart attack Father     Social History:  reports that  has never smoked. she has never used smokeless tobacco. She reports that she drinks alcohol. She reports that she does not use drugs.  Review of Systems:  HYPERTENSION:  followed by PCP/cardiologist, blood pressure Is usually controlled on losartan 100 mg,   Home ?  BP Readings from Last 3 Encounters:  10/22/17 132/90  07/23/17 140/82  04/15/17 130/76    HYPERLIPIDEMIA: The lipid abnormality consists of elevated LDL, taking Vytorin, followed by cardiologist.   She was tried on Crestor but she thinks this caused leg pain  and is again on Vytorin   Lab Results  Component Value Date   CHOL 135 10/06/2016   HDL 40 10/06/2016   LDLCALC 63 10/06/2016   LDLDIRECT 121.2 05/08/2013   TRIG 158 (H) 10/06/2016   CHOLHDL 3.4 10/06/2016    History of mild hypothyroidism, previously treated by PCP  She has previously required progressively higher doses of levothyroxine.  She is  taking 100 g daily since 11/2014  No recent fatigue  TSH recently consistently normal  Lab Results  Component Value Date   TSH 2.38 07/20/2017   TSH 3.61 12/01/2016   TSH 3.26 07/21/2016        Examination:   BP 132/90 (BP Location: Left Arm, Patient Position: Sitting, Cuff Size: Normal)   Pulse 69   Ht 5' 6" (1.676 m)   Wt 188 lb 6.4 oz (85.5 kg)   SpO2 98%   BMI 30.41 kg/m   Body mass index is 30.41 kg/m.     ASSESSMENT/ PLAN:   Diabetes type 2 With BMI 31  See history of present illness for  discussion of current management, blood sugar patterns and problems identified  She has had difficulty getting consistent control of her diabetes However her A1c is 7.1 Most likely her improved control is related to better compliance with mealtime boluses using the V-go pump compared to Humalog injections. She also has started taking Victoza again which seems to be helping both fasting and some postprandial readings However her main difficulty is not getting readings controlled after supper and likely is not getting adequate insulin despite eating relatively smaller meals in the evenings She is still trying to exercise and keeping her weight  stable, encouraged her to continue aerobic activity  Discussed needing to check blood sugars more often after supper to help adjust mealtime insulin Does not need to check blood sugar every single morning She needs to take at least 12-14 units bolus at suppertime to get readings at least under 180 at bedtime She will keep her 30 unit basal on the pump since recent fasting readings are  coming down and may be better also with keeping blood sugars better later at night Encourage her to watch carbohydrates at all times Will change her Humalog prescription reflecting use of the full amount in the V-go pump daily of 66 units Sample of the insulin pumps given until next prescription is covered  LIPIDS: Needs follow-up  HYPOTHYROID: Adequately replaced 100 g of levothyroxine and will continue  INCREASED microalbumin: This is mildly increased and blood pressure may not be adequately controlled, will forward this to PCP and cardiologist  There are no Patient Instructions on file for this visit.Counseling time on subjects discussed above is over 50% of today's 25 minute visit  Elayne Snare 10/22/2017, 9:36 AM                                                  Patient ID: Crystal Hobbs, female   DOB: 23-Dec-1950, 67 y.o.   MRN: [id number]   Reason for Appointment: Diabetes follow-up   History of Present Illness   Diagnosis: Type 2 DIABETES MELITUS, date of diagnosis:  2011   Past history: Her initial presentation with diabetes was with significant symptoms and glucose of 947 Initially was treated with insulin and subsequently on metformin which she could not tolerate  Since she was not responding to Victoza and had significant hyperglycemia she was started on basal bolus insulin regimen instead  However had required relatively large doses of insulin with inadequate control and blood sugars subsequently improved significantly with adding Victoza in 10/13  Her  A1c was 6.4 in 4/14 and she had overall good control with minimal hypoglycemia She was taken off  Afrezza because of her high out-of-pocket expense and she was tried on V-go pump which she had some difficulties getting this through the pharmacy  RECENT history:    Insulin regimen: V-go 30 units Humalog boluses 04-21-09 units, 3 times a day Non-insulin hypoglycemic drugs: Victoza 1.2 mg daily   She is on a regimen of basal  bolus insulin regimen, using the V-go pump since 04/08/16  Her A1c has improved at 7.1, previously has been 7.5   Current blood sugar patterns and problems identified:  Although her A1c is better her blood sugars at home are recently significantly high and averaging about 178  She thinks that most of her sugars are high because of stress  FASTING blood sugars at home are mostly high although late morning in the lab glucose was only 102  She also appears to have persistently high readings after supper, checking only a few readings towards bedtime.  She is concerned that her insurance is not covering her Humalog well since she is using up more than what her prescription is written for  She continues to like using the V-go which has helped her compliance with mealtime insulin boluses  VICTOZA: She had been told to try leaving this off on her last visit but recently she has gone back on it  since she thinks blood sugars may have been higher without it.  She had been concerned about the cost of this previously  She is using a new monitor and the date and time are incorrect  Side effects from medications:  diarrhea from metformin,? Hair loss from Actos Proper timing of medications in relation to meals: Yes         Monitors blood glucose: Recently 1-2 times  a day.    Glucometer: One Touch ultra mini .          Blood Glucose readings from monitor   Mean values apply above for all meters except median for One Touch  PRE-MEAL Fasting Lunch Dinner Bedtime Overall  Glucose range: 89-328  140- 156  159-178  146-296    Mean/median: 165    257  166     Hypoglycemia: None recently      Meals: 3 meals per day.  Breakfast: may have half a peanut better sandwich Or a granola bar.   Dinner at 6-8 pm, Recently dinner is a smaller meal than lunch          Physical activity: exercise: doing some walking and is trying to play tennis 2/7 days a week            Diabetes education visit: Most recent:  05/2012      CDE visit 10/17         Wt Readings from Last 3 Encounters:  10/22/17 188 lb 6.4 oz (85.5 kg)  07/23/17 183 lb 4 oz (83.1 kg)  04/15/17 184 lb (83.5 kg)    Lab Results  Component Value Date   HGBA1C 6.9 (H) 10/19/2017   HGBA1C 6.7 (H) 07/20/2017   HGBA1C 7.7 (H) 03/02/2017   Lab Results  Component Value Date   MICROALBUR 59.6 (H) 10/19/2017   LDLCALC 63 10/06/2016   CREATININE 1.03 10/19/2017    Other problems discussed today: See review of systems   Lab on 10/19/2017  Component Date Value Ref Range Status  . Microalb, Ur 10/19/2017 59.6* 0.0 - 1.9 mg/dL Final  . Creatinine,U 10/19/2017 149.5  mg/dL Final  . Microalb Creat Ratio 10/19/2017 39.9* 0.0 - 30.0 mg/g Final  . Sodium 10/19/2017 141  135 - 145 mEq/L Final  . Potassium 10/19/2017 4.3  3.5 - 5.1 mEq/L Final  . Chloride 10/19/2017 109  96 - 112 mEq/L Final  . CO2 10/19/2017 27  19 - 32 mEq/L Final  . Glucose, Bld 10/19/2017 110* 70 - 99 mg/dL Final  . BUN 10/19/2017 20  6 - 23 mg/dL Final  . Creatinine, Ser 10/19/2017 1.03  0.40 - 1.20 mg/dL Final  . Calcium 10/19/2017 9.6  8.4 - 10.5 mg/dL Final  . GFR 10/19/2017 56.48* >60.00 mL/min Final  . Hgb A1c MFr Bld 10/19/2017 6.9* 4.6 - 6.5 % Final   Glycemic Control Guidelines for People with Diabetes:Non Diabetic:  <6%Goal of Therapy: <7%Additional Action Suggested:  >8%     Allergies as of 10/22/2017      Reactions   Actos [pioglitazone]    hairloss   Metformin And Related Diarrhea   Novocain [procaine]    syncopy   Sulfa Antibiotics Itching      Medication List        Accurate as of 10/22/17  9:36 AM. Always use your most recent med list.          aspirin 81 MG tablet Take 81 mg by mouth QID. PT STATES SHE TAKES 3-4 ASA  QD   Biotin 5000 MCG Caps Take 1 capsule by mouth 2 (two) times daily.   canagliflozin 100 MG Tabs tablet Commonly known as:  INVOKANA 1 tablet before breakfast   ezetimibe-simvastatin 10-40 MG tablet Commonly known  as:  VYTORIN Take 1 tablet by mouth daily. Please keep upcoming appt in February. Thank you   glucose blood test strip Commonly known as:  ACCU-CHEK AVIVA PLUS Use as instructed to check blood sugar 3 times per day dx code E11.65   glucose blood test strip Commonly known as:  ONE TOUCH ULTRA TEST Use as instructed to check blood sugar 3 times per day dx code E11.65   insulin lispro 100 UNIT/ML injection Commonly known as:  HUMALOG Use 74 UNITS IN V-GO PUMP DAILY   levothyroxine 100 MCG tablet Commonly known as:  SYNTHROID, LEVOTHROID Take 1 tablet (100 mcg total) by mouth daily.   losartan 100 MG tablet Commonly known as:  COZAAR TAKE 1 TABLET BY MOUTH ONCE DAILY   nitroGLYCERIN 0.4 MG SL tablet Commonly known as:  NITROSTAT Place 1 tablet (0.4 mg total) under the tongue every 5 (five) minutes as needed for chest pain.   omega-3 acid ethyl esters 1 g capsule Commonly known as:  LOVAZA Take 2 g by mouth 2 (two) times daily.   SUPER B COMPLEX/C Caps Take 1 tablet by mouth 2 (two) times daily.   V-GO 30 Kit INJECT ONCE DAILY AS DIRECTED       Allergies:  Allergies  Allergen Reactions  . Actos [Pioglitazone]     hairloss  . Metformin And Related Diarrhea  . Novocain [Procaine]     syncopy  . Sulfa Antibiotics Itching    Past Medical History:  Diagnosis Date  . Arthritis   . Complication of anesthesia    takes along time to wake up  . Contact lens/glasses fitting    wears glasses or contacts  . Coronary artery disease 01/2007   s/p PCI of diagonal   . Diabetes mellitus without complication ([place])   . Dyslipidemia   . Hyperlipidemia   . Hypertension   . Hypothyroidism   . Snores   . Vertigo     Past Surgical History:  Procedure Laterality Date  . CARDIAC CATHETERIZATION  2008   stent  . SHOULDER ARTHROSCOPY WITH OPEN ROTATOR CUFF REPAIR Right 02/28/2013   Procedure: right shoulder arthroscopy, distal clavicle sculpting, open rotator cuff repair;   Surgeon: Cammie Sickle., MD;  Location: [facility];  Service: Orthopedics;  Laterality: Right;    Family History  Problem Relation Age of Onset  . Hypertension Father   . Heart attack Father     Social History:  reports that  has never smoked. she has never used smokeless tobacco. She reports that she drinks alcohol. She reports that she does not use drugs.  Review of Systems:  HYPERTENSION:  followed by PCP/cardiologist, blood pressure Is treated by cardiologist, on losartan 100 mg  She does monitor blood pressure periodically at home, recently higher around 145/80  HYPERLIPIDEMIA: The lipid abnormality consists of elevated LDL, taking Vytorin, followed by cardiologist.   She was tried on Crestor but she thinks this caused leg pain and is on Vytorin She will discuss her management with cardiologist on her follow-up   Lab Results  Component Value Date   CHOL 135 10/06/2016   HDL 40 10/06/2016   LDLCALC 63 10/06/2016   LDLDIRECT 121.2 05/08/2013   TRIG 158 (H) 10/06/2016  CHOLHDL 3.4 10/06/2016    History of mild hypothyroidism, previously treated by PCP  She is  taking 100 g daily since 11/2014   TSH Is  consistently normal  Lab Results  Component Value Date   TSH 2.38 07/20/2017   TSH 3.61 12/01/2016   TSH 3.26 07/21/2016        Examination:   BP 132/90 (BP Location: Left Arm, Patient Position: Sitting, Cuff Size: Normal)   Pulse 69   Ht 5' 6" (1.676 m)   Wt 188 lb 6.4 oz (85.5 kg)   SpO2 98%   BMI 30.41 kg/m   Body mass index is 30.41 kg/m.     ASSESSMENT/ PLAN:   Diabetes type 2 With BMI 31  See history of present illness for  discussion of current management, blood sugar patterns and problems identified  A1c is still below 7%  Fasting readings are on average about 129 and adequate However she probably has some high readings after meals which she does not monitor enough Excellent now at 6.7 which is better than usual Her  blood sugars are consistently controlled with adding Invokana 100 mg in June  Again discussed option using the Omnipod pump since currently she has difficulty bolusing before meals and also adjusting her dose; with her lifestyle she will find this more convenient Discussed advantages of this pump However she was concerned about the skin irritation with keeping the pump on for 3 days She was given a sample Omni pod pump to try and she will let us know  Meanwhile encouraged her to check more sugars after meals consistently She probably can adjust her boluses better at breakfast and lunch if she has more postprandial readings She needs to proactively bolus 2-4 units more for higher carbohydrate or larger meals in the evening Discussed that we should avoid bolusing at bedtime to prevent potential overnight hypoglycemia   Lipids to be checked by cardiologist, is overdue for follow-up  HYPERTENSION: Blood pressure is high She has persistent microalbuminuria and would prefer to have her on Benicar instead of losartan for improved renal benefits; discussed this with patient also Also likely needs more consistently improved control to reduce renal complications  She will discuss this with her cardiologist   HYPOTHYROIDISM, will recheck TSH on next visit   There are no Patient Instructions on file for this visit.   Elayne Snare 10/22/2017, 9:36 AM

## 2017-11-01 ENCOUNTER — Encounter: Payer: Self-pay | Admitting: Cardiology

## 2017-11-01 ENCOUNTER — Ambulatory Visit: Payer: Medicare Other | Admitting: Cardiology

## 2017-11-01 ENCOUNTER — Other Ambulatory Visit: Payer: Self-pay | Admitting: Cardiology

## 2017-11-01 ENCOUNTER — Ambulatory Visit (INDEPENDENT_AMBULATORY_CARE_PROVIDER_SITE_OTHER): Payer: Medicare Other | Admitting: Cardiology

## 2017-11-01 VITALS — BP 132/60 | HR 67 | Ht 66.0 in | Wt 189.4 lb

## 2017-11-01 DIAGNOSIS — I2510 Atherosclerotic heart disease of native coronary artery without angina pectoris: Secondary | ICD-10-CM

## 2017-11-01 DIAGNOSIS — R011 Cardiac murmur, unspecified: Secondary | ICD-10-CM | POA: Diagnosis not present

## 2017-11-01 DIAGNOSIS — I10 Essential (primary) hypertension: Secondary | ICD-10-CM

## 2017-11-01 DIAGNOSIS — E7800 Pure hypercholesterolemia, unspecified: Secondary | ICD-10-CM | POA: Diagnosis not present

## 2017-11-01 MED ORDER — OLMESARTAN MEDOXOMIL 40 MG PO TABS: 40.0000 mg | ORAL_TABLET | Freq: Every day | ORAL | 0 refills | Status: DC

## 2017-11-01 MED ORDER — ASPIRIN EC 81 MG PO TBEC: 81.0000 mg | DELAYED_RELEASE_TABLET | Freq: Every day | ORAL | 3 refills | Status: DC

## 2017-11-01 NOTE — Progress Notes (Signed)
Cardiology Office Note:    Date:  11/01/2017   ID:  Crystal Hobbs, DOB 01/10/1951, MRN [id number]  PCP:  Carol Ada, MD  Cardiologist:  No primary care provider on file.    Referring MD: Carol Ada, MD   Chief Complaint  Patient presents with  . Coronary Artery Disease  . Hypertension  . Hyperlipidemia    History of Present Illness:    Crystal Hobbs is a 67 y.o. 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  She is here today for followup and is doing well.  She denies any chest pain or pressure, SOB, DOE, PND, orthopnea, LE edema, dizziness or syncope. She has had a few episodes of palpitations but nothing sustained.  She is compliant with her meds and is tolerating meds with no SE.  She says at times her BP has been elevated at 157/59mhg when seeing Dr. KDwyane Dee  She checks it at home and runs around 157/942mg.    Past Medical History:  Diagnosis Date  . Arthritis   . Complication of anesthesia    takes along time to wake up  . Contact lens/glasses fitting    wears glasses or contacts  . Coronary artery disease 01/2007   s/p PCI of diagonal   . Diabetes mellitus without complication (HCOtero  . Dyslipidemia   . Hyperlipidemia   . Hypertension   . Hypothyroidism   . Snores   . Vertigo     Past Surgical History:  Procedure Laterality Date  . CARDIAC CATHETERIZATION  2008   stent  . SHOULDER ARTHROSCOPY WITH OPEN ROTATOR CUFF REPAIR Right 02/28/2013   Procedure: right shoulder arthroscopy, distal clavicle sculpting, open rotator cuff repair;  Surgeon: RoCammie Sickle MD;  [facility]: [facility]ics;  Laterality: Right;    Current Medications: Current Meds  Medication Sig  . aspirin 81 MG tablet Take 81 mg by mouth QID. PT STATES SHE TAKES 3-4 ASA QD  . Biotin 5000 MCG CAPS Take 1 capsule by mouth 2 (two) times daily.  . canagliflozin (INVOKANA) 100 MG TABS tablet 1 tablet before breakfast  .  ezetimibe-simvastatin (VYTORIN) 10-40 MG tablet Take 1 tablet by mouth daily. Please keep upcoming appt in February. Thank you  . glucose blood (ONE TOUCH ULTRA TEST) test strip Use as instructed to check blood sugar 3 times per day dx code E11.65  . Insulin Disposable Pump (V-GO 30) KIT INJECT ONCE DAILY AS DIRECTED  . insulin lispro (HUMALOG) 100 UNIT/ML injection Use 74 UNITS IN V-GO PUMP DAILY  . levothyroxine (SYNTHROID, LEVOTHROID) 100 MCG tablet Take 1 tablet (100 mcg total) by mouth daily.  . Marland Kitchenosartan (COZAAR) 100 MG tablet TAKE 1 TABLET BY MOUTH ONCE DAILY  . nitroGLYCERIN (NITROSTAT) 0.4 MG SL tablet Place 1 tablet (0.4 mg total) under the tongue every 5 (five) minutes as needed for chest pain.  . Marland Kitchenmega-3 acid ethyl esters (LOVAZA) 1 G capsule Take 2 g by mouth 2 (two) times daily.  . SUPER B COMPLEX/C CAPS Take 1 tablet by mouth 2 (two) times daily.     Allergies:   Actos [pioglitazone]; Metformin and related; Novocain [procaine]; and Sulfa antibiotics   Social History   Socioeconomic History  . Marital status: Married    Spouse name: None  . Number of children: None  . Years of education: None  . Highest education level: None  Social Needs  . Financial  resource strain: None  . Food insecurity - worry: None  . Food insecurity - inability: None  . Transportation needs - medical: None  . Transportation needs - non-medical: None  Occupational History  . None  Tobacco Use  . Smoking status: Never Smoker  . Smokeless tobacco: Never Used  Substance and Sexual Activity  . Alcohol use: Yes    Comment: occ  . Drug use: No  . Sexual activity: None  Other Topics Concern  . None  Social History Narrative  . None     Family History: The patient's family history includes Heart attack in her father; Hypertension in her father.  ROS:   Please see the history of present illness.    Review of Systems  Musculoskeletal: Positive for back pain.    All other systems reviewed  and negative.   EKGs/Labs/Other Studies Reviewed:    The following studies were reviewed today: none  EKG:  EKG is ordered today and showed NSR with septal infarct and no ST chnages  Recent Labs: 07/20/2017: ALT 20; TSH 2.38 10/19/2017: BUN 20; Creatinine, Ser 1.03; Potassium 4.3; Sodium 141   Recent Lipid Panel    Component Value Date/Time   CHOL 135 10/06/2016 0800   TRIG 158 (H) 10/06/2016 0800   HDL 40 10/06/2016 0800   CHOLHDL 3.4 10/06/2016 0800   CHOLHDL 3.6 10/25/2015 0753   VLDL 21 10/25/2015 0753   LDLCALC 63 10/06/2016 0800   LDLDIRECT 121.2 05/08/2013 0852    Physical Exam:    VS:  BP 132/60   Pulse 67   Ht _0  (1.676 m)   Wt 189 lb 6.4 oz (85.9 kg)   SpO2 95%   BMI 30.57 kg/m     Wt Readings from Last 3 Encounters:  11/01/17 189 lb 6.4 oz (85.9 kg)  10/22/17 188 lb 6.4 oz (85.5 kg)  07/23/17 183 lb 4 oz (83.1 kg)     GEN:  Well nourished, well developed in no acute distress HEENT: Normal NECK: No JVD; No carotid bruits LYMPHATICS: No lymphadenopathy CARDIAC: RRR, no rubs, gallops.  Occasional ectopy and 2/6 SM at RUSB RESPIRATORY:  Clear to auscultation without rales, wheezing or rhonchi  ABDOMEN: Soft, non-tender, non-distended MUSCULOSKELETAL:  No edema; No deformity  SKIN: Warm and dry NEUROLOGIC:  Alert and oriented x 3 PSYCHIATRIC:  Normal affect   ASSESSMENT:    1. Coronary artery disease involving native coronary artery of native heart without angina pectoris   2. Essential hypertension   3. Pure hypercholesterolemia   4. Heart murmur    PLAN:    In order of problems listed above:  1.  ASCAD - s/p BMS to D1 in 2008.  Stress myoview in 2016 with no ischemia.  She denies any anginal symptoms.  She will continue on ASA 54m daily and statin.   2.  HTN - BP is well controlled on exam today but has been running high .  The losartan has not been keeping her BP controlled so I will change her to Benicar 437mdaily and followup in HTN  clinic in 2-3 weeks.  I will check a BMET  3.  Hyperlipidemia with LDL goal < 70.  She will continue on Vytorin 10-40mg daily.  I will get an FLP and ALT.   4.  Heart murmur - echo 1 year ago with no valvular heart disease.    Medication Adjustments/Labs and Tests Ordered: Current medicines are reviewed at length with the patient today.  Concerns regarding medicines are outlined above.  No orders of the defined types were placed in this encounter.  No orders of the defined types were placed in this encounter.   Signed, Fransico Him, MD  11/01/2017 1:50 PM    [place]

## 2017-11-01 NOTE — Patient Instructions (Signed)
Medication Instructions:  Your physician has recommended you make the following change in your medication:  STOP: losartan  START: Benicar 40 mg(1 tablet) once a day  If you need a refill on your cardiac medications, please contact your pharmacy first.  Labwork: Your physician recommends that you return for lab work in: 1 week for kidney function test, liver function and fasting lipids  Testing/Procedures: None ordered   Follow-Up: Your physician recommends that you schedule a follow-up appointment in 3 weeks at the hypertension clinic   Your physician wants you to follow-up in: 1 year with Dr. Radford Pax. You will receive a reminder letter in the mail two months in advance. If you don't receive a letter, please call our office to schedule the follow-up appointment.  Any Other Special Instructions Will Be Listed Below (If Applicable).   Thank you for choosing [place], RN  [phone]  If you need a refill on your cardiac medications before your next appointment, please call your pharmacy.

## 2017-11-02 ENCOUNTER — Ambulatory Visit: Payer: Medicare Other | Admitting: Cardiology

## 2017-11-03 ENCOUNTER — Other Ambulatory Visit: Payer: Self-pay | Admitting: Cardiology

## 2017-11-04 MED ORDER — OLMESARTAN MEDOXOMIL 40 MG PO TABS: ORAL_TABLET | ORAL | 3 refills | Status: DC

## 2017-11-04 NOTE — Addendum Note (Signed)
Addended by: De Burrs on: 11/04/2017 11:16 AM   Modules accepted: Orders

## 2017-11-08 ENCOUNTER — Other Ambulatory Visit: Payer: Medicare Other | Admitting: *Deleted

## 2017-11-08 DIAGNOSIS — E7800 Pure hypercholesterolemia, unspecified: Secondary | ICD-10-CM

## 2017-11-08 DIAGNOSIS — I2510 Atherosclerotic heart disease of native coronary artery without angina pectoris: Secondary | ICD-10-CM

## 2017-11-08 LAB — BASIC METABOLIC PANEL
BUN / CREAT RATIO: 20 (ref 12–28)
BUN: 23 mg/dL (ref 8–27)
CHLORIDE: 106 mmol/L (ref 96–106)
CO2: 22 mmol/L (ref 20–29)
CREATININE: 1.15 mg/dL — AB (ref 0.57–1.00)
Calcium: 9.8 mg/dL (ref 8.7–10.3)
GFR calc Af Amer: 56 mL/min/{1.73_m2} — ABNORMAL LOW (ref >59)
GFR calc non Af Amer: 49 mL/min/{1.73_m2} — ABNORMAL LOW (ref >59)
Glucose: 136 mg/dL — ABNORMAL HIGH (ref 65–99)
Potassium: 4.7 mmol/L (ref 3.5–5.2)
SODIUM: 142 mmol/L (ref 134–144)

## 2017-11-08 LAB — HEPATIC FUNCTION PANEL
ALBUMIN: 4.5 g/dL (ref 3.6–4.8)
ALK PHOS: 93 IU/L (ref 39–117)
ALT: 22 IU/L (ref 0–32)
AST: 18 IU/L (ref 0–40)
Bilirubin Total: 0.3 mg/dL (ref 0.0–1.2)
Bilirubin, Direct: 0.1 mg/dL (ref 0.00–0.40)
Total Protein: 6.8 g/dL (ref 6.0–8.5)

## 2017-11-08 LAB — LIPID PANEL
CHOL/HDL RATIO: 2.7 ratio (ref 0.0–4.4)
CHOLESTEROL TOTAL: 120 mg/dL (ref 100–199)
HDL: 45 mg/dL (ref >39)
LDL CALC: 55 mg/dL (ref 0–99)
TRIGLYCERIDES: 98 mg/dL (ref 0–149)
VLDL Cholesterol Cal: 20 mg/dL (ref 5–40)

## 2017-11-10 LAB — HM DIABETES EYE EXAM

## 2017-11-12 ENCOUNTER — Other Ambulatory Visit: Payer: Self-pay | Admitting: Cardiology

## 2017-11-12 NOTE — Telephone Encounter (Signed)
Signed, Fransico Him, MD  11/01/2017 1:50 PM    [facility]    Patient Instructions by Teressa Senter, RN at 11/01/2017 1:20 PM   Author: Teressa Senter, RN Author Type: Registered Nurse Filed: 11/01/2017 2:08 PM  Note Status: Signed Cosign: Cosign Not Required Encounter Date: 11/01/2017  Editor: Teressa Senter, RN (Registered Nurse)    Medication Instructions:  Your physician has recommended you make the following change in your medication:  STOP: losartan

## 2017-11-23 ENCOUNTER — Encounter: Payer: Self-pay | Admitting: Pharmacist

## 2017-11-23 ENCOUNTER — Ambulatory Visit (INDEPENDENT_AMBULATORY_CARE_PROVIDER_SITE_OTHER): Payer: Medicare Other | Admitting: Pharmacist

## 2017-11-23 VITALS — BP 122/74 | HR 70

## 2017-11-23 DIAGNOSIS — I10 Essential (primary) hypertension: Secondary | ICD-10-CM

## 2017-11-23 LAB — BASIC METABOLIC PANEL
BUN/Creatinine Ratio: 21 (ref 12–28)
BUN: 23 mg/dL (ref 8–27)
CO2: 20 mmol/L (ref 20–29)
CREATININE: 1.1 mg/dL — AB (ref 0.57–1.00)
Calcium: 9.8 mg/dL (ref 8.7–10.3)
Chloride: 108 mmol/L — ABNORMAL HIGH (ref 96–106)
GFR, EST AFRICAN AMERICAN: 59 mL/min/{1.73_m2} — AB (ref >59)
GFR, EST NON AFRICAN AMERICAN: 51 mL/min/{1.73_m2} — AB (ref >59)
Glucose: 90 mg/dL (ref 65–99)
Potassium: 4.7 mmol/L (ref 3.5–5.2)
SODIUM: 143 mmol/L (ref 134–144)

## 2017-11-23 NOTE — Progress Notes (Signed)
Patient ID: Crystal Hobbs                 DOB: [birth year]                      MRN: [id number]     HPI: Crystal Hobbs is a 67 y.o. female patient of Dr. Radford Pax who presents today for hypertension evaluation. PMH significant for ASCAD with BMS to D1 in 2008, HTN and dyslipidemia. At her most recent visit her losartan was changed to olmesartan 64m daily due to recall.   She presents today impatient that she was told she could come at any time. She reports no new/different symptoms with olmesartan. She denies chest pain, SOB, dizziness, and headache. She has been doing well since change and reports she has not really monitored her pressures. Pt reports she had not started olmesartan when most recent BMET drawn.    Current HTN meds:  olmesartan 466mdaily  Previously tried: losartan (changed due to recall)  BP goal: <130/80  Family History: Heart attack in her father; Hypertension in her father.  Social History: Denies tobacco. Occasional alcohol.   Diet: Eats out some. She eats mostly sit down restaurants. She eats lean meat. She is mindful about carbs and lean cuts of meat. She is carb conscious. She rarely adds salt to her food. She drinks diet pepsi mostly with some water.   Exercise: Play tennis for 1.5 hours several days per week.   Home BP readings: has not checked at home.   Wt Readings from Last 3 Encounters:  11/01/17 189 lb 6.4 oz (85.9 kg)  10/22/17 188 lb 6.4 oz (85.5 kg)  07/23/17 183 lb 4 oz (83.1 kg)   BP Readings from Last 3 Encounters:  11/23/17 122/74  11/01/17 132/60  10/22/17 132/90   Pulse Readings from Last 3 Encounters:  11/23/17 70  11/01/17 67  10/22/17 69    Renal function: CrCl cannot be calculated (Unknown ideal weight.).  Past Medical History:  Diagnosis Date  . Arthritis   . Complication of anesthesia    takes along time to wake up  . Contact lens/glasses fitting    wears glasses or contacts  . Coronary artery disease 01/2007   s/p PCI of  diagonal   . Diabetes mellitus without complication ([facility]  . Dyslipidemia   . Hyperlipidemia   . Hypertension   . Hypothyroidism   . Snores   . Vertigo     Current Outpatient Medications on File Prior to Visit  Medication Sig Dispense Refill  . aspirin EC 81 MG tablet Take 1 tablet (81 mg total) by mouth daily. 90 tablet 3  . Biotin 5000 MCG CAPS Take 1 capsule by mouth 2 (two) times daily.    . canagliflozin (INVOKANA) 100 MG TABS tablet 1 tablet before breakfast 30 tablet 3  . ezetimibe-simvastatin (VYTORIN) 10-40 MG tablet Take 1 tablet by mouth daily. Please keep upcoming appt in February. Thank you 90 tablet 0  . glucose blood (ONE TOUCH ULTRA TEST) test strip Use as instructed to check blood sugar 3 times per day dx code E11.65 300 each 1  . Insulin Disposable Pump (V-GO 30) KIT INJECT ONCE DAILY AS DIRECTED 30 kit 2  . insulin lispro (HUMALOG) 100 UNIT/ML injection Use 74 UNITS IN V-GO PUMP DAILY 20 mL 11  . levothyroxine (SYNTHROID, LEVOTHROID) 100 MCG tablet Take 1 tablet (100 mcg total) by mouth daily. 90 tablet 2  .  nitroGLYCERIN (NITROSTAT) 0.4 MG SL tablet Place 1 tablet (0.4 mg total) under the tongue every 5 (five) minutes as needed for chest pain. 90 tablet 3  . olmesartan (BENICAR) 40 MG tablet TAKE 1 TABLET(40 MG) BY MOUTH DAILY 90 tablet 3  . omega-3 acid ethyl esters (LOVAZA) 1 G capsule Take 2 g by mouth 2 (two) times daily.    . SUPER B COMPLEX/C CAPS Take 1 tablet by mouth 2 (two) times daily.     No current facility-administered medications on file prior to visit.     Allergies  Allergen Reactions  . Actos [Pioglitazone]     hairloss  . Metformin And Related Diarrhea  . Novocain [Procaine]     syncopy  . Sulfa Antibiotics Itching    Blood pressure 122/74, pulse 70.   Assessment/Plan: Hypertension: BP is well controlled at on current regimen. BMET today. She will remain on olmesartan 40m daily and call if pressure trend up or down. Have encouraged  her to continue her exercise with playing tennis. Follow up with Dr. TRadford Paxas recommended and HTN clinic as needed.    Thank you, KLelan Pons [facility]  11/23/2017 11:48 AM

## 2017-11-23 NOTE — Patient Instructions (Addendum)
CONTINUE olmesartan 40mg  daily.   Try to monitor pressures when able and call if they trend above 130/80.   Follow up with Dr. Radford Pax when recommended.

## 2017-11-25 ENCOUNTER — Other Ambulatory Visit: Payer: Self-pay | Admitting: Cardiology

## 2017-11-25 MED ORDER — OLMESARTAN MEDOXOMIL 40 MG PO TABS: ORAL_TABLET | ORAL | 3 refills | Status: DC

## 2017-12-03 ENCOUNTER — Encounter: Payer: Self-pay | Admitting: Endocrinology

## 2017-12-16 ENCOUNTER — Other Ambulatory Visit: Payer: Self-pay | Admitting: Endocrinology

## 2017-12-21 ENCOUNTER — Other Ambulatory Visit: Payer: Self-pay | Admitting: Endocrinology

## 2018-01-07 ENCOUNTER — Other Ambulatory Visit: Payer: Self-pay | Admitting: Cardiology

## 2018-01-10 ENCOUNTER — Other Ambulatory Visit: Payer: Self-pay | Admitting: Endocrinology

## 2018-01-18 ENCOUNTER — Other Ambulatory Visit (INDEPENDENT_AMBULATORY_CARE_PROVIDER_SITE_OTHER): Payer: Medicare Other

## 2018-01-18 DIAGNOSIS — E1165 Type 2 diabetes mellitus with hyperglycemia: Secondary | ICD-10-CM

## 2018-01-18 DIAGNOSIS — Z794 Long term (current) use of insulin: Secondary | ICD-10-CM

## 2018-01-18 LAB — LIPID PANEL
Cholesterol: 243 mg/dL — ABNORMAL HIGH (ref 0–200)
HDL: 36.8 mg/dL — ABNORMAL LOW (ref >39.00)
NonHDL: 206.22
TRIGLYCERIDES: 287 mg/dL — AB (ref 0.0–149.0)
Total CHOL/HDL Ratio: 7
VLDL: 57.4 mg/dL — ABNORMAL HIGH (ref 0.0–40.0)

## 2018-01-18 LAB — BASIC METABOLIC PANEL
BUN: 21 mg/dL (ref 6–23)
CHLORIDE: 108 meq/L (ref 96–112)
CO2: 26 meq/L (ref 19–32)
CREATININE: 1.09 mg/dL (ref 0.40–1.20)
Calcium: 9.6 mg/dL (ref 8.4–10.5)
GFR: 52.87 mL/min — ABNORMAL LOW (ref >60.00)
Glucose, Bld: 125 mg/dL — ABNORMAL HIGH (ref 70–99)
Potassium: 5 mEq/L (ref 3.5–5.1)
SODIUM: 140 meq/L (ref 135–145)

## 2018-01-18 LAB — LDL CHOLESTEROL, DIRECT: LDL DIRECT: 130 mg/dL

## 2018-01-18 LAB — HEMOGLOBIN A1C: Hgb A1c MFr Bld: 6.9 % — ABNORMAL HIGH (ref 4.6–6.5)

## 2018-01-18 LAB — T4, FREE: FREE T4: 0.83 ng/dL (ref 0.60–1.60)

## 2018-01-18 LAB — TSH: TSH: 4.89 u[IU]/mL — ABNORMAL HIGH (ref 0.35–4.50)

## 2018-01-20 ENCOUNTER — Encounter: Payer: Self-pay | Admitting: Endocrinology

## 2018-01-20 ENCOUNTER — Ambulatory Visit: Payer: Medicare Other | Admitting: Endocrinology

## 2018-01-20 VITALS — BP 142/80 | HR 69 | Ht 66.0 in | Wt 192.8 lb

## 2018-01-20 DIAGNOSIS — E1165 Type 2 diabetes mellitus with hyperglycemia: Secondary | ICD-10-CM

## 2018-01-20 DIAGNOSIS — E782 Mixed hyperlipidemia: Secondary | ICD-10-CM | POA: Diagnosis not present

## 2018-01-20 DIAGNOSIS — E063 Autoimmune thyroiditis: Secondary | ICD-10-CM

## 2018-01-20 DIAGNOSIS — Z794 Long term (current) use of insulin: Secondary | ICD-10-CM | POA: Diagnosis not present

## 2018-01-20 MED ORDER — CANAGLIFLOZIN 300 MG PO TABS: 300.0000 mg | ORAL_TABLET | Freq: Every day | ORAL | 3 refills | Status: DC

## 2018-01-20 MED ORDER — LEVOTHYROXINE SODIUM 112 MCG PO TABS: 112.0000 ug | ORAL_TABLET | Freq: Every day | ORAL | 3 refills | Status: DC

## 2018-01-20 NOTE — Patient Instructions (Addendum)
Invokana 200mg  now and next Rx for 300mg  dose  New rx for Synthroid will be 112ug  With 100 dose use 7 1/2 pills weekly  Check blood sugars on waking up  3-4/7  Also check blood sugars about 2 hours after a meal and do this after different meals by rotation  Recommended blood sugar levels on waking up is 90-130 and about 2 hours after meal is 130-160  Please bring your blood sugar monitor to each visit, thank you  [facility] Relion Novolin R  Dr Gavin Pound. Dr Arlean Hopping

## 2018-01-20 NOTE — Progress Notes (Signed)
Patient ID: Crystal Hobbs, female   DOB: 05/19/1951, 67 y.o.   MRN: [id number]   Reason for Appointment: Diabetes follow-up   History of Present Illness   Diagnosis: Type 2 DIABETES MELITUS, date of diagnosis:  2011   Past history: Her initial presentation with diabetes was with significant symptoms and glucose of 947 Initially was treated with insulin and subsequently on metformin which she could not tolerate  Since she was not responding to Victoza and had significant hyperglycemia she was started on basal bolus insulin regimen instead  However had required relatively large doses of insulin with inadequate control and blood sugars subsequently improved significantly with adding Victoza in 10/13  Her  A1c was 6.4 in 4/14 and she had overall good control with minimal hypoglycemia She was taken off  Afrezza because of her high out-of-pocket expense and she was tried on V-go pump which she had some difficulties getting this through the pharmacy  RECENT history:    Insulin regimen: V-go 30 units Humalog boluses 6 units, 3 times a day Non-insulin hypoglycemic drugs: Invokana 100 mg daily   She is on a regimen of basal bolus insulin regimen, using the V-go pump since 04/08/16  Her A1c 6.9 again  Current blood sugar patterns and problems identified:  She has high readings on her monitor download especially fasting compared to her last visit but her A1c is not changed  She thinks that this is from eating later at night but also from probably getting excessively hungry in the evening and not controlling portions because of eating late  However only rarely has checked her sugars before bedtime  She will mostly take about the same amount of bolus for all the meals, occasionally more at suppertime  Although she is complaining about the cost of Invokana she continues to take this  Despite her trying to be more active with playing tennis she appears to have gained another 3 pounds  since her last visit  She was told to try looking at the Omnipod insulin pump but she is concerned that this may come off while showering and did not pursue this  Also may be concerned about the cost of Humalog in the donut hole  She does try to change her pump consistently daily  Side effects from medications:  diarrhea from metformin,? Hair loss from Actos Proper timing of medications in relation to meals: Yes         Monitors blood glucose: Recently 1-2 times  a day.    Glucometer: One Touch ultra mini .          Blood Glucose readings from monitor:  Mean values apply above for all meters except median for One Touch  PRE-MEAL Fasting Lunch Dinner Bedtime Overall  Glucose range:  132-184  141  118, 156  130, 144   Mean/median:  145     143+/-17   POST-MEAL PC Breakfast PC Lunch PC Dinner  Glucose range:   ?  Mean/median:       Meals: 3 meals per day.  She is trying to limit her calories and carbohydrate  Breakfast: may have half a peanut better sandwich or a granola bar.     Dinner at 6-8 pm        Physical activity: exercise: doing walking or is going to play tennis 1-3/7 days a week            Diabetes education visit: Most recent: 05/2012  CDE visit 10/17         Wt Readings from Last 3 Encounters:  01/20/18 192 lb 12.8 oz (87.5 kg)  11/01/17 189 lb 6.4 oz (85.9 kg)  10/22/17 188 lb 6.4 oz (85.5 kg)    Lab Results  Component Value Date   HGBA1C 6.9 (H) 01/18/2018   HGBA1C 6.9 (H) 10/19/2017   HGBA1C 6.7 (H) 07/20/2017   Lab Results  Component Value Date   MICROALBUR 59.6 (H) 10/19/2017   LDLCALC 55 11/08/2017   CREATININE 1.09 01/18/2018    Other problems discussed today: See review of systems   Lab on 01/18/2018  Component Date Value Ref Range Status  . Free T4 01/18/2018 0.83  0.60 - 1.60 ng/dL Final   Comment: Specimens from patients who are undergoing biotin therapy and /or ingesting biotin supplements may contain high levels of biotin.  The  higher biotin concentration in these specimens interferes with this Free T4 assay.  Specimens that contain high levels  of biotin may cause false high results for this Free T4 assay.  Please interpret results in light of the total clinical presentation of the patient.    Marland Kitchen TSH 01/18/2018 4.89* 0.35 - 4.50 uIU/mL Final  . Cholesterol 01/18/2018 243* 0 - 200 mg/dL Final   ATP III Classification       Desirable:  < 200 mg/dL               Borderline High:  200 - 239 mg/dL          High:  > = 240 mg/dL  . Triglycerides 01/18/2018 287.0* 0.0 - 149.0 mg/dL Final   Normal:  <150 mg/dLBorderline High:  150 - 199 mg/dL  . HDL 01/18/2018 36.80* >39.00 mg/dL Final  . VLDL 01/18/2018 57.4* 0.0 - 40.0 mg/dL Final  . Total CHOL/HDL Ratio 01/18/2018 7   Final                  Men          Women1/2 Average Risk     3.4          3.3Average Risk          5.0          4.42X Average Risk          9.6          7.13X Average Risk          15.0          11.0                      . NonHDL 01/18/2018 206.22   Final   NOTE:  Non-HDL goal should be 30 mg/dL higher than patient's LDL goal (i.e. LDL goal of < 70 mg/dL, would have non-HDL goal of < 100 mg/dL)  . Sodium 01/18/2018 140  135 - 145 mEq/L Final  . Potassium 01/18/2018 5.0  3.5 - 5.1 mEq/L Final  . Chloride 01/18/2018 108  96 - 112 mEq/L Final  . CO2 01/18/2018 26  19 - 32 mEq/L Final  . Glucose, Bld 01/18/2018 125* 70 - 99 mg/dL Final  . BUN 01/18/2018 21  6 - 23 mg/dL Final  . Creatinine, Ser 01/18/2018 1.09  0.40 - 1.20 mg/dL Final  . Calcium 01/18/2018 9.6  8.4 - 10.5 mg/dL Final  . GFR 01/18/2018 52.87* >60.00 mL/min Final  . Hgb A1c MFr Bld 01/18/2018 6.9* 4.6 - 6.5 % Final  Glycemic Control Guidelines for People with Diabetes:Non Diabetic:  <6%Goal of Therapy: <7%Additional Action Suggested:  >8%   . Direct LDL 01/18/2018 130.0  mg/dL Final   Optimal:  <100 mg/dLNear or Above Optimal:  100-129 mg/dLBorderline High:  130-159 mg/dLHigh:  160-189  mg/dLVery High:  >190 mg/dL    Allergies as of 01/20/2018      Reactions   Actos [pioglitazone]    hairloss   Metformin And Related Diarrhea   Novocain [procaine]    syncopy   Sulfa Antibiotics Itching      Medication List        Accurate as of 01/20/18  2:43 PM. Always use your most recent med list.          aspirin EC 81 MG tablet Take 1 tablet (81 mg total) by mouth daily.   Biotin 5000 MCG Caps Take 1 capsule by mouth 2 (two) times daily.   ezetimibe-simvastatin 10-40 MG tablet Commonly known as:  VYTORIN Take 1 tablet by mouth daily.   glucose blood test strip Commonly known as:  ONE TOUCH ULTRA TEST USE TO CHECK BLOOD SUGAR THREE TIMES DAILY AS DIRECTED   insulin lispro 100 UNIT/ML injection Commonly known as:  HUMALOG Use 74 UNITS IN V-GO PUMP DAILY   INVOKANA 100 MG Tabs tablet Generic drug:  canagliflozin TAKE 1 TABLET BY MOUTH BEFORE BREAKFAST   levothyroxine 100 MCG tablet Commonly known as:  SYNTHROID, LEVOTHROID Take 1 tablet (100 mcg total) by mouth daily.   nitroGLYCERIN 0.4 MG SL tablet Commonly known as:  NITROSTAT Place 1 tablet (0.4 mg total) under the tongue every 5 (five) minutes as needed for chest pain.   olmesartan 40 MG tablet Commonly known as:  BENICAR TAKE 1 TABLET(40 MG) BY MOUTH DAILY   omega-3 acid ethyl esters 1 g capsule Commonly known as:  LOVAZA Take 2 g by mouth 2 (two) times daily.   SUPER B COMPLEX/C Caps Take 1 tablet by mouth 2 (two) times daily.   V-GO 30 Kit INJECT ONCE DAILY AS DIRECTED       Allergies:  Allergies  Allergen Reactions  . Actos [Pioglitazone]     hairloss  . Metformin And Related Diarrhea  . Novocain [Procaine]     syncopy  . Sulfa Antibiotics Itching    Past Medical History:  Diagnosis Date  . Arthritis   . Complication of anesthesia    takes along time to wake up  . Contact lens/glasses fitting    wears glasses or contacts  . Coronary artery disease 01/2007   s/p PCI of  diagonal   . Diabetes mellitus without complication ([place])   . Dyslipidemia   . Hyperlipidemia   . Hypertension   . Hypothyroidism   . Snores   . Vertigo     Past Surgical History:  Procedure Laterality Date  . CARDIAC CATHETERIZATION  2008   stent  . SHOULDER ARTHROSCOPY WITH OPEN ROTATOR CUFF REPAIR Right 02/28/2013   Procedure: right shoulder arthroscopy, distal clavicle sculpting, open rotator cuff repair;  Surgeon: Cammie Sickle., MD;  Location: [facility];  Service: Orthopedics;  Laterality: Right;    Family History  Problem Relation Age of Onset  . Hypertension Father   . Heart attack Father     Social History:  reports that she has never smoked. She has never used smokeless tobacco. She reports that she drinks alcohol. She reports that she does not use drugs.  Review of Systems:  HYPERTENSION:  followed by PCP/cardiologist, blood pressure   Now on Benicar instead of losartan, 40 mg  BP Readings from Last 3 Encounters:  01/20/18 (!) 142/80  11/23/17 122/74  11/01/17 132/60    HYPERLIPIDEMIA: The lipid abnormality consists of elevated LDL, taking Vytorin, followed by cardiologist.   She was tried on Crestor but she thinks this caused leg pain  Her cholesterol is markedly increased and was high in March with the PCP also even though she thinks she is taking the Vytorin regularly except for 1 day prior to the labs Triglycerides also high   Lab Results  Component Value Date   CHOL 243 (H) 01/18/2018   HDL 36.80 (L) 01/18/2018   LDLCALC 55 11/08/2017   LDLDIRECT 130.0 01/18/2018   TRIG 287.0 (H) 01/18/2018   CHOLHDL 7 01/18/2018    History of mild hypothyroidism, previously treated by PCP  She has previously required progressively higher doses of levothyroxine.  She is taking 100 g daily since 11/2014  At times may have some fatigue  TSH recently as Butch Penny, previously had been consistently normal  Lab Results  Component Value Date    TSH 4.89 (H) 01/18/2018   TSH 2.38 07/20/2017   TSH 3.61 12/01/2016   She is having more trouble with her rheumatoid arthritis and her PCP wants me to refer her to a rheumatologist     Examination:   BP (!) 142/80 (BP Location: Left Arm, Patient Position: Sitting, Cuff Size: Normal)   Pulse 69   Ht '5\' 6"'$  (1.676 m)   Wt 192 lb 12.8 oz (87.5 kg)   SpO2 98%   BMI 31.12 kg/m   Body mass index is 31.12 kg/m.     ASSESSMENT/ PLAN:   Diabetes type 2 With BMI 31  See history of present illness for  discussion of current management, blood sugar patterns and problems identified  She has had difficulty getting consistent control of her diabetes However her A1c is still 6.9 despite her blood sugars averaging nearly 150 in the morning and possibly higher after some meals She has gained some weight She feels that she is not controlling her portions at suppertime Although previously had benefit from Victoza she would probably find this too expensive  Recommendations:  Check readings after supper consistently to help adjust her evening boluses  Increase Invokana up to 300 mg  Take higher doses of insulin to cover her meals that are higher in carbohydrate or portions in the evening  Call if fasting readings consistently stay high  She was given a demonstration model of the Omnipod pump which she can try to use for couple of days and see if this will stick better than the V-go pump.  If interested she will look into the cost of this  May try [employer] brand insulin if Humalog is expensive but try to bolus 30 minutes before eating  LIPIDS: Most likely she has not been taking her Vytorin in the last few weeks even though she thinks she is because of her marked increase in lipids compared to previous levels Needs to check and see if she is taking this regularly and follow-up with lipids on the next visit also  HYPOTHYROIDISM: Her TSH is higher and she will need to go up to 112 mcg Currently  with the 100 g of levothyroxine she has at home she will take an extra half pill weekly  INCREASED microalbumin: Will need to recheck on the next visit to see if Benicar is helping better  than the losartan  Given names of rheumatologists   Patient Instructions  Invokana 226m now and next Rx for 3048mdose  New rx for Synthroid will be 112ug  With 100 dose use 7 1/2 pills weekly  Check blood sugars on waking up  3-4/7  Also check blood sugars about 2 hours after a meal and do this after different meals by rotation  Recommended blood sugar levels on waking up is 90-130 and about 2 hours after meal is 130-160  Please bring your blood sugar monitor to each visit, thank you  WaVladimir Fasterelion Novolin R  Dr AnGavin PoundDr S Arlean Hopping  Counseling time on subjects discussed in assessment and plan sections is over 50% of today's 25 minute visit  AjElayne Snare/05/2018, 2:43 PM

## 2018-02-11 ENCOUNTER — Other Ambulatory Visit: Payer: Self-pay | Admitting: Endocrinology

## 2018-02-28 ENCOUNTER — Telehealth: Payer: Self-pay | Admitting: Endocrinology

## 2018-02-28 NOTE — Telephone Encounter (Signed)
Patient need a refill but she need clarification on medication dosage. She stated that [facility] gave some names of contacts, she forgot what there were. Please advise

## 2018-02-28 NOTE — Telephone Encounter (Signed)
Pt stated that she has always taken Invokana 100mg  and now the Rx states 300mg . Pt would like to know if the Invokana is supposed to be 300mg  or 100mg .  She would also like to know the name and number of the referral that you placed in for her RA

## 2018-02-28 NOTE — Telephone Encounter (Signed)
Called pt and gave her MD message as well as Dr. Trudie Reed phone number. PT verbalized understanding.

## 2018-02-28 NOTE — Telephone Encounter (Signed)
Her dose is 300 mg.  Information about this and rheumatology consultants is in her last note: Dr Gavin Pound. Dr Arlean Hopping

## 2018-03-14 ENCOUNTER — Other Ambulatory Visit: Payer: Self-pay | Admitting: Endocrinology

## 2018-03-23 ENCOUNTER — Telehealth: Payer: Self-pay | Admitting: Endocrinology

## 2018-03-23 ENCOUNTER — Other Ambulatory Visit: Payer: Self-pay

## 2018-03-23 MED ORDER — INSULIN LISPRO 100 UNIT/ML ~~LOC~~ SOLN: SUBCUTANEOUS | 11 refills | Status: DC

## 2018-03-23 NOTE — Telephone Encounter (Signed)
Rx sent and called pt and left detailed voicemail explaining to pt that a refill request for Humalog was never received by this office but a refill has been sent in for her.

## 2018-03-23 NOTE — Telephone Encounter (Signed)
insulin lispro (HUMALOG) 100 UNIT/ML injection    Patient stated that pharmacy stated that Dr Dwyane Dee Denied refills for patient and she would like to know why She is almost out of her insulin. Please advise

## 2018-04-05 ENCOUNTER — Other Ambulatory Visit: Payer: Self-pay | Admitting: Endocrinology

## 2018-04-19 ENCOUNTER — Other Ambulatory Visit: Payer: Self-pay | Admitting: Endocrinology

## 2018-04-19 ENCOUNTER — Other Ambulatory Visit: Payer: Self-pay | Admitting: Cardiology

## 2018-04-20 NOTE — Telephone Encounter (Signed)
Outpatient Medication Detail    Disp Refills Start End   ezetimibe-simvastatin (VYTORIN) 10-40 MG tablet 90 tablet 2 01/07/2018    Sig - Route: Take 1 tablet by mouth daily. - Oral   Sent to pharmacy as: ezetimibe-simvastatin (VYTORIN) 10-40 MG tablet   E-Prescribing Status: Receipt confirmed by pharmacy (01/07/2018 11:54 AM EDT)   [id number] - [place], [address] AT [address]

## 2018-04-26 ENCOUNTER — Other Ambulatory Visit: Payer: Medicare Other

## 2018-04-29 ENCOUNTER — Ambulatory Visit: Payer: Medicare Other | Admitting: Endocrinology

## 2018-05-17 ENCOUNTER — Other Ambulatory Visit: Payer: Self-pay | Admitting: Endocrinology

## 2018-05-26 ENCOUNTER — Other Ambulatory Visit (INDEPENDENT_AMBULATORY_CARE_PROVIDER_SITE_OTHER): Payer: Medicare Other

## 2018-05-26 DIAGNOSIS — E1165 Type 2 diabetes mellitus with hyperglycemia: Secondary | ICD-10-CM

## 2018-05-26 DIAGNOSIS — Z794 Long term (current) use of insulin: Secondary | ICD-10-CM | POA: Diagnosis not present

## 2018-05-26 DIAGNOSIS — E063 Autoimmune thyroiditis: Secondary | ICD-10-CM

## 2018-05-26 DIAGNOSIS — E782 Mixed hyperlipidemia: Secondary | ICD-10-CM | POA: Diagnosis not present

## 2018-05-26 LAB — COMPREHENSIVE METABOLIC PANEL
ALT: 26 U/L (ref 0–35)
AST: 19 U/L (ref 0–37)
Albumin: 4 g/dL (ref 3.5–5.2)
Alkaline Phosphatase: 74 U/L (ref 39–117)
BUN: 24 mg/dL — AB (ref 6–23)
CALCIUM: 9.8 mg/dL (ref 8.4–10.5)
CHLORIDE: 110 meq/L (ref 96–112)
CO2: 26 meq/L (ref 19–32)
CREATININE: 1.17 mg/dL (ref 0.40–1.20)
GFR: 48.67 mL/min — ABNORMAL LOW (ref >60.00)
Glucose, Bld: 109 mg/dL — ABNORMAL HIGH (ref 70–99)
Potassium: 4.5 mEq/L (ref 3.5–5.1)
SODIUM: 142 meq/L (ref 135–145)
Total Bilirubin: 0.5 mg/dL (ref 0.2–1.2)
Total Protein: 7 g/dL (ref 6.0–8.3)

## 2018-05-26 LAB — T4, FREE: FREE T4: 0.65 ng/dL (ref 0.60–1.60)

## 2018-05-26 LAB — LIPID PANEL
CHOL/HDL RATIO: 4
Cholesterol: 162 mg/dL (ref 0–200)
HDL: 37.7 mg/dL — ABNORMAL LOW (ref >39.00)
LDL CALC: 89 mg/dL (ref 0–99)
NonHDL: 124.35
Triglycerides: 178 mg/dL — ABNORMAL HIGH (ref 0.0–149.0)
VLDL: 35.6 mg/dL (ref 0.0–40.0)

## 2018-05-26 LAB — MICROALBUMIN / CREATININE URINE RATIO
Creatinine,U: 140.1 mg/dL
MICROALB UR: 43.3 mg/dL — AB (ref 0.0–1.9)
Microalb Creat Ratio: 30.9 mg/g — ABNORMAL HIGH (ref 0.0–30.0)

## 2018-05-26 LAB — HEMOGLOBIN A1C: HEMOGLOBIN A1C: 7.1 % — AB (ref 4.6–6.5)

## 2018-05-26 LAB — TSH: TSH: 6.55 u[IU]/mL — ABNORMAL HIGH (ref 0.35–4.50)

## 2018-05-31 NOTE — Progress Notes (Signed)
Patient ID: Crystal Hobbs, female   DOB: 11/11/1950, 67 y.o.   MRN: [id number]   Reason for Appointment: Diabetes follow-up   History of Present Illness   Diagnosis: Type 2 DIABETES MELITUS, date of diagnosis:  2011   Past history: Her initial presentation with diabetes was with significant symptoms and glucose of 947 Initially was treated with insulin and subsequently on metformin which she could not tolerate  Since she was not responding to Victoza and had significant hyperglycemia she was started on basal bolus insulin regimen instead  However had required relatively large doses of insulin with inadequate control and blood sugars subsequently improved significantly with adding Victoza in 10/13  Her  A1c was 6.4 in 4/14 and she had overall good control with minimal hypoglycemia She was taken off  Afrezza because of her high out-of-pocket expense and she was tried on V-go pump which she had some difficulties getting this through the pharmacy  RECENT history:    Insulin regimen: V-go 30 units Humalog boluses 6 units, 3 times a day Non-insulin hypoglycemic drugs: Invokana 300 mg daily   She is on a regimen of basal bolus insulin regimen and Invokana, using the V-go pump since 04/08/16  Her A1c is 7.1, previously 6.9    Current blood sugar patterns and problems identified:  She does not have a functioning glucose meter since she lost it on vacation but appears to be having falsely high readings with her old meter that she is using compared to the lab glucose  Has been gaining weight  She thinks this is from inconsistent exercise  Also possibly not consistent with diet when she was on vacation end of last month  Asking about her Humalog not working since it got very cold in the refrigerator when she was traveling  Probably not checking sugars consistently after meals times although she does not think they are higher  More recently has started back on some exercise with  walking  Side effects from medications:  diarrhea from metformin,? Hair loss from Actos Proper timing of medications in relation to meals: Yes         Monitors blood glucose: Recently 1-2 times  a day.    Glucometer: One Touch ultra mini .          Blood Glucose readings by recall:   PRE-MEAL Fasting Lunch Dinner Bedtime Overall  Glucose range:  160-170      Mean/median:        POST-MEAL PC Breakfast PC Lunch PC Dinner  Glucose range:   150  Mean/median:       Previously  PRE-MEAL Fasting Lunch Dinner Bedtime Overall  Glucose range:  132-184  141  118, 156  130, 144   Mean/median:  145     143+/-17   POST-MEAL PC Breakfast PC Lunch PC Dinner  Glucose range:   ?  Mean/median:       Meals: 3 meals per day.  She is trying to limit her calories and carbohydrate  Breakfast: may have half a peanut better sandwich or a granola bar.     Dinner at 6-8 pm        Physical activity: exercise: Irregularly doing walking or going to play tennis 1-3/7 days a week            Diabetes education visit: Most recent: 05/2012      CDE visit 10/17         Wt Readings from Last 3 Encounters:  06/01/18 195 lb (88.5 kg)  01/20/18 192 lb 12.8 oz (87.5 kg)  11/01/17 189 lb 6.4 oz (85.9 kg)    Lab Results  Component Value Date   HGBA1C 7.1 (H) 05/26/2018   HGBA1C 6.9 (H) 01/18/2018   HGBA1C 6.9 (H) 10/19/2017   Lab Results  Component Value Date   MICROALBUR 43.3 (H) 05/26/2018   LDLCALC 89 05/26/2018   CREATININE 1.17 05/26/2018    Other problems discussed today: See review of systems   Lab on 05/26/2018  Component Date Value Ref Range Status  . Free T4 05/26/2018 0.65  0.60 - 1.60 ng/dL Final   Comment: Specimens from patients who are undergoing biotin therapy and /or ingesting biotin supplements may contain high levels of biotin.  The higher biotin concentration in these specimens interferes with this Free T4 assay.  Specimens that contain high levels  of biotin may cause false  high results for this Free T4 assay.  Please interpret results in light of the total clinical presentation of the patient.    Marland Kitchen TSH 05/26/2018 6.55* 0.35 - 4.50 uIU/mL Final  . Microalb, Ur 05/26/2018 43.3* 0.0 - 1.9 mg/dL Final  . Creatinine,U 05/26/2018 140.1  mg/dL Final  . Microalb Creat Ratio 05/26/2018 30.9* 0.0 - 30.0 mg/g Final  . Cholesterol 05/26/2018 162  0 - 200 mg/dL Final   ATP III Classification       Desirable:  < 200 mg/dL               Borderline High:  200 - 239 mg/dL          High:  > = 240 mg/dL  . Triglycerides 05/26/2018 178.0* 0.0 - 149.0 mg/dL Final   Normal:  <150 mg/dLBorderline High:  150 - 199 mg/dL  . HDL 05/26/2018 37.70* >39.00 mg/dL Final  . VLDL 05/26/2018 35.6  0.0 - 40.0 mg/dL Final  . LDL Cholesterol 05/26/2018 89  0 - 99 mg/dL Final  . Total CHOL/HDL Ratio 05/26/2018 4   Final                  Men          Women1/2 Average Risk     3.4          3.3Average Risk          5.0          4.42X Average Risk          9.6          7.13X Average Risk          15.0          11.0                      . NonHDL 05/26/2018 124.35   Final   NOTE:  Non-HDL goal should be 30 mg/dL higher than patient's LDL goal (i.e. LDL goal of < 70 mg/dL, would have non-HDL goal of < 100 mg/dL)  . Sodium 05/26/2018 142  135 - 145 mEq/L Final  . Potassium 05/26/2018 4.5  3.5 - 5.1 mEq/L Final  . Chloride 05/26/2018 110  96 - 112 mEq/L Final  . CO2 05/26/2018 26  19 - 32 mEq/L Final  . Glucose, Bld 05/26/2018 109* 70 - 99 mg/dL Final  . BUN 05/26/2018 24* 6 - 23 mg/dL Final  . Creatinine, Ser 05/26/2018 1.17  0.40 - 1.20 mg/dL Final  . Total Bilirubin 05/26/2018 0.5  0.2 -  1.2 mg/dL Final  . Alkaline Phosphatase 05/26/2018 74  39 - 117 U/L Final  . AST 05/26/2018 19  0 - 37 U/L Final  . ALT 05/26/2018 26  0 - 35 U/L Final  . Total Protein 05/26/2018 7.0  6.0 - 8.3 g/dL Final  . Albumin 05/26/2018 4.0  3.5 - 5.2 g/dL Final  . Calcium 05/26/2018 9.8  8.4 - 10.5 mg/dL Final  . GFR  05/26/2018 48.67* >60.00 mL/min Final  . Hgb A1c MFr Bld 05/26/2018 7.1* 4.6 - 6.5 % Final   Glycemic Control Guidelines for People with Diabetes:Non Diabetic:  <6%Goal of Therapy: <7%Additional Action Suggested:  >8%     Allergies as of 06/01/2018      Reactions   Actos [pioglitazone]    hairloss   Metformin And Related Diarrhea   Novocain [procaine]    syncopy   Sulfa Antibiotics Itching      Medication List        Accurate as of 06/01/18 11:05 AM. Always use your most recent med list.          aspirin EC 81 MG tablet Take 1 tablet (81 mg total) by mouth daily.   Biotin 5000 MCG Caps Take 1 capsule by mouth 2 (two) times daily.   canagliflozin 300 MG Tabs tablet Commonly known as:  INVOKANA Take 1 tablet (300 mg total) by mouth daily before breakfast.   ezetimibe-simvastatin 10-40 MG tablet Commonly known as:  VYTORIN Take 1 tablet by mouth daily.   glucose blood test strip USE TO CHECK BLOOD SUGAR THREE TIMES DAILY AS DIRECTED   insulin lispro 100 UNIT/ML injection Commonly known as:  HUMALOG Use 74 UNITS IN V-GO PUMP DAILY   levothyroxine 112 MCG tablet Commonly known as:  SYNTHROID, LEVOTHROID Take 1 tablet (112 mcg total) by mouth daily.   nitroGLYCERIN 0.4 MG SL tablet Commonly known as:  NITROSTAT Place 1 tablet (0.4 mg total) under the tongue every 5 (five) minutes as needed for chest pain.   olmesartan 40 MG tablet Commonly known as:  BENICAR TAKE 1 TABLET(40 MG) BY MOUTH DAILY   omega-3 acid ethyl esters 1 g capsule Commonly known as:  LOVAZA Take 2 g by mouth 2 (two) times daily.   SUPER B COMPLEX/C Caps Take 1 tablet by mouth 2 (two) times daily.   V-GO 30 Kit INJECT ONCE DAILY AS DIRECTED       Allergies:  Allergies  Allergen Reactions  . Actos [Pioglitazone]     hairloss  . Metformin And Related Diarrhea  . Novocain [Procaine]     syncopy  . Sulfa Antibiotics Itching    Past Medical History:  Diagnosis Date  . Arthritis     . Complication of anesthesia    takes along time to wake up  . Contact lens/glasses fitting    wears glasses or contacts  . Coronary artery disease 01/2007   s/p PCI of diagonal   . Diabetes mellitus without complication ([place])   . Dyslipidemia   . Hyperlipidemia   . Hypertension   . Hypothyroidism   . Snores   . Vertigo     Past Surgical History:  Procedure Laterality Date  . CARDIAC CATHETERIZATION  2008   stent  . SHOULDER ARTHROSCOPY WITH OPEN ROTATOR CUFF REPAIR Right 02/28/2013   Procedure: right shoulder arthroscopy, distal clavicle sculpting, open rotator cuff repair;  Surgeon: Cammie Sickle., MD;  Location: [facility];  Service: Orthopedics;  Laterality: Right;  Family History  Problem Relation Age of Onset  . Hypertension Father   . Heart attack Father     Social History:  reports that she has never smoked. She has never used smokeless tobacco. She reports that she drinks alcohol. She reports that she does not use drugs.  Review of Systems:  HYPERTENSION:  followed by PCP/cardiologist, blood pressure   Now on Benicar instead of losartan, taking 40 mg with better control Also urine microalbumin is improved  BP Readings from Last 3 Encounters:  06/01/18 136/78  01/20/18 (!) 142/80  11/23/17 122/74    HYPERLIPIDEMIA: The lipid abnormality consists of elevated LDL, taking Vytorin, followed by cardiologist.   She was tried on Crestor but she thinks this caused leg pain  Her cholesterol previously was markedly increased even with reported good compliance with the medication but now it is back to excellent levels Has minimal increase in triglycerides and low HDL  Lab Results  Component Value Date   CHOL 162 05/26/2018   HDL 37.70 (L) 05/26/2018   LDLCALC 89 05/26/2018   LDLDIRECT 130.0 01/18/2018   TRIG 178.0 (H) 05/26/2018   CHOLHDL 4 05/26/2018    History of mild hypothyroidism, treated for several years Her TSH was high in  5/19 Now taking 1 5 mcg instead of 100s, not complaining of fatigue However she was without her medication for 10 days on vacation and her lab was done only about a week after restarting  TSH still high  Lab Results  Component Value Date   TSH 6.55 (H) 05/26/2018   TSH 4.89 (H) 01/18/2018   TSH 2.38 07/20/2017        Examination:   BP 136/78 (BP Location: Left Arm, Patient Position: Sitting, Cuff Size: Large)   Pulse 76   Resp 20   Ht 5' 6"  (1.676 m)   Wt 195 lb (88.5 kg)   SpO2 96%   BMI 31.47 kg/m   Body mass index is 31.47 kg/m.     ASSESSMENT/ PLAN:   Diabetes type 2 With BMI 31  See history of present illness for  discussion of current management, blood sugar patterns and problems identified  Her A1c is 7.1  Unable to get a good idea of her blood sugar control since she does not have her meter and appears to have falsely high readings on her current glucose meter that she did not bring She also has gained some weight again despite increasing her Invokana to 300 mg However she was not able to exercise has consistently  Again discussed the V-go pump compared to the Omnipod and she is liking the idea of doing the Omnipod but needs to know the cost  Recommendations: She will use a new meter Paperwork to be done for starting to get her Omnipod insulin pump To call if fasting readings are consistently high Also adjust boluses based on how her blood sugars are coming out after evening meals consistent exercise Stay on 300 mg Invokana as her renal functions and potassium are stable  LIPIDS: Most likely she has had been taking her Vytorin more regularly and lipids are much better However does need weight loss to improve her triglycerides and HDL  HYPOTHYROIDISM: Her TSH is higher again but this may be related to her being out of medication at the end of August  She will stay with 112 mcg until the next visit   INCREASED microalbumin: Slightly better with  continuing Benicar instead of losartan  Total visit time  for evaluation and management of multiple problems and counseling =25 minutes Influenza vaccine given, information handout vaccine given   There are no Patient Instructions on file for this visit.     Elayne Snare 06/01/2018, 11:05 AM

## 2018-06-01 ENCOUNTER — Ambulatory Visit: Payer: Medicare Other | Admitting: Endocrinology

## 2018-06-01 VITALS — BP 136/78 | HR 76 | Resp 20 | Ht 66.0 in | Wt 195.0 lb

## 2018-06-01 DIAGNOSIS — E1165 Type 2 diabetes mellitus with hyperglycemia: Secondary | ICD-10-CM | POA: Diagnosis not present

## 2018-06-01 DIAGNOSIS — E782 Mixed hyperlipidemia: Secondary | ICD-10-CM

## 2018-06-01 DIAGNOSIS — E063 Autoimmune thyroiditis: Secondary | ICD-10-CM | POA: Diagnosis not present

## 2018-06-01 DIAGNOSIS — Z794 Long term (current) use of insulin: Secondary | ICD-10-CM

## 2018-06-01 DIAGNOSIS — Z23 Encounter for immunization: Secondary | ICD-10-CM | POA: Diagnosis not present

## 2018-06-01 MED ORDER — ONETOUCH ULTRA MINI W/DEVICE KIT: PACK | 0 refills | Status: DC

## 2018-06-18 ENCOUNTER — Other Ambulatory Visit: Payer: Self-pay | Admitting: Endocrinology

## 2018-07-04 ENCOUNTER — Telehealth: Payer: Self-pay | Admitting: Endocrinology

## 2018-07-04 NOTE — Telephone Encounter (Signed)
Notified pt to call back the office  in 2 weeks- to make an appt. For [facility] and Dr. Dwyane Dee

## 2018-07-04 NOTE — Telephone Encounter (Signed)
Patient requests to be called at ph# [phone] re: changing how patient does her insulin. She has received the Omnipod and was told to call when she received it.

## 2018-07-05 ENCOUNTER — Telehealth: Payer: Self-pay | Admitting: Endocrinology

## 2018-07-05 NOTE — Telephone Encounter (Signed)
Received fax from [employer]. Pt was approved for Omnipod Mis 5 pack, PA- [id number] From 06/14/2018-09/14/2019. Approval letter placed in scan bin.

## 2018-07-09 ENCOUNTER — Other Ambulatory Visit: Payer: Self-pay | Admitting: Endocrinology

## 2018-08-09 ENCOUNTER — Encounter: Payer: Medicare Other | Admitting: Nutrition

## 2018-08-10 ENCOUNTER — Other Ambulatory Visit: Payer: Self-pay | Admitting: Endocrinology

## 2018-08-15 ENCOUNTER — Encounter: Payer: Medicare Other | Attending: Endocrinology | Admitting: Nutrition

## 2018-08-15 DIAGNOSIS — E1165 Type 2 diabetes mellitus with hyperglycemia: Secondary | ICD-10-CM | POA: Insufficient documentation

## 2018-08-16 ENCOUNTER — Encounter: Payer: Self-pay | Admitting: Endocrinology

## 2018-08-16 ENCOUNTER — Other Ambulatory Visit: Payer: Self-pay

## 2018-08-16 ENCOUNTER — Encounter: Payer: Medicare Other | Admitting: Nutrition

## 2018-08-16 ENCOUNTER — Ambulatory Visit (INDEPENDENT_AMBULATORY_CARE_PROVIDER_SITE_OTHER): Payer: Medicare Other | Admitting: Endocrinology

## 2018-08-16 ENCOUNTER — Ambulatory Visit: Payer: Medicare Other | Admitting: Internal Medicine

## 2018-08-16 VITALS — BP 136/80 | HR 61 | Ht 66.0 in | Wt 196.6 lb

## 2018-08-16 DIAGNOSIS — Z794 Long term (current) use of insulin: Secondary | ICD-10-CM | POA: Diagnosis not present

## 2018-08-16 DIAGNOSIS — E1165 Type 2 diabetes mellitus with hyperglycemia: Secondary | ICD-10-CM | POA: Diagnosis not present

## 2018-08-16 MED ORDER — GLUCOSE BLOOD VI STRP: 1.0000 | ORAL_STRIP | 3 refills | Status: DC | PRN

## 2018-08-16 NOTE — Progress Notes (Signed)
Patient ID: LAURY HUIZAR, female   DOB: 29-Dec-1950, 67 y.o.   MRN: [id number]   Reason for Appointment: Diabetes follow-up   History of Present Illness   Diagnosis: Type 2 DIABETES MELITUS, date of diagnosis:  2011   Past history: Her initial presentation with diabetes was with significant symptoms and glucose of 947 Initially was treated with insulin and subsequently on metformin which she could not tolerate  Since she was not responding to Victoza and had significant hyperglycemia she was started on basal bolus insulin regimen instead  However had required relatively large doses of insulin with inadequate control and blood sugars subsequently improved significantly with adding Victoza in 10/13  Her  A1c was 6.4 in 4/14 and she had overall good control with minimal hypoglycemia She was taken off  Afrezza because of her high out-of-pocket expense and she was tried on V-go pump which she had some difficulties getting this through the pharmacy  RECENT history:    Insulin regimen: OMNIPOD insulin pump BASAL rates 1.5 midnight-7 AM and also 7 PM-midnight.  Basal rate 1.3 from 7 AM-7 PM Carbohydrate coverage 1: 10.  Preset boluses 3-6 units at breakfast and lunch and 6 units at dinner Correction 1: 50 with target 100-120  Previously using V-go 30 units Humalog boluses 6 units, 3 times a day  Non-insulin hypoglycemic drugs: Invokana 300 mg daily  Her A1c in September is 7.1, previously 6.9    Current blood sugar patterns and problems identified:  She had no difficulty starting the insulin pump yesterday morning  Blood sugar was 150 before lunch yesterday but now 85; blood sugar at dinnertime was 100  She has not done postprandial readings  Bedtime reading last night 108 and this morning fasting reading 105  Side effects from medications:  diarrhea from metformin,? Hair loss from Actos Proper timing of medications in relation to meals: Yes         Monitors blood  glucose: Recently 1-2 times  a day.    Glucometer:  Contour Next .          Blood Glucose readings as above  Meals: 3 meals per day.  She is trying to limit her calories and carbohydrate  Breakfast: may have half a peanut better sandwich or a granola bar.     Dinner at 6-8 pm        Physical activity: exercise: Sometimes doing walking or going to play tennis 1-3/7 days a week            Diabetes education visit: Most recent: 05/2012      CDE visit 12/19        Wt Readings from Last 3 Encounters:  08/16/18 196 lb 9.6 oz (89.2 kg)  06/01/18 195 lb (88.5 kg)  01/20/18 192 lb 12.8 oz (87.5 kg)    Lab Results  Component Value Date   HGBA1C 7.1 (H) 05/26/2018   HGBA1C 6.9 (H) 01/18/2018   HGBA1C 6.9 (H) 10/19/2017   Lab Results  Component Value Date   MICROALBUR 43.3 (H) 05/26/2018   LDLCALC 89 05/26/2018   CREATININE 1.17 05/26/2018    Other problems discussed today: See review of systems   No visits with results within 1 Week(s) from this visit.  Latest known visit with results is:  Lab on 05/26/2018  Component Date Value Ref Range Status  . Free T4 05/26/2018 0.65  0.60 - 1.60 ng/dL Final   Comment: Specimens from patients who are undergoing biotin therapy  and /or ingesting biotin supplements may contain high levels of biotin.  The higher biotin concentration in these specimens interferes with this Free T4 assay.  Specimens that contain high levels  of biotin may cause false high results for this Free T4 assay.  Please interpret results in light of the total clinical presentation of the patient.    Marland Kitchen TSH 05/26/2018 6.55* 0.35 - 4.50 uIU/mL Final  . Microalb, Ur 05/26/2018 43.3* 0.0 - 1.9 mg/dL Final  . Creatinine,U 05/26/2018 140.1  mg/dL Final  . Microalb Creat Ratio 05/26/2018 30.9* 0.0 - 30.0 mg/g Final  . Cholesterol 05/26/2018 162  0 - 200 mg/dL Final   ATP III Classification       Desirable:  < 200 mg/dL               Borderline High:  200 - 239 mg/dL           High:  > = 240 mg/dL  . Triglycerides 05/26/2018 178.0* 0.0 - 149.0 mg/dL Final   Normal:  <150 mg/dLBorderline High:  150 - 199 mg/dL  . HDL 05/26/2018 37.70* >39.00 mg/dL Final  . VLDL 05/26/2018 35.6  0.0 - 40.0 mg/dL Final  . LDL Cholesterol 05/26/2018 89  0 - 99 mg/dL Final  . Total CHOL/HDL Ratio 05/26/2018 4   Final                  Men          Women1/2 Average Risk     3.4          3.3Average Risk          5.0          4.42X Average Risk          9.6          7.13X Average Risk          15.0          11.0                      . NonHDL 05/26/2018 124.35   Final   NOTE:  Non-HDL goal should be 30 mg/dL higher than patient's LDL goal (i.e. LDL goal of < 70 mg/dL, would have non-HDL goal of < 100 mg/dL)  . Sodium 05/26/2018 142  135 - 145 mEq/L Final  . Potassium 05/26/2018 4.5  3.5 - 5.1 mEq/L Final  . Chloride 05/26/2018 110  96 - 112 mEq/L Final  . CO2 05/26/2018 26  19 - 32 mEq/L Final  . Glucose, Bld 05/26/2018 109* 70 - 99 mg/dL Final  . BUN 05/26/2018 24* 6 - 23 mg/dL Final  . Creatinine, Ser 05/26/2018 1.17  0.40 - 1.20 mg/dL Final  . Total Bilirubin 05/26/2018 0.5  0.2 - 1.2 mg/dL Final  . Alkaline Phosphatase 05/26/2018 74  39 - 117 U/L Final  . AST 05/26/2018 19  0 - 37 U/L Final  . ALT 05/26/2018 26  0 - 35 U/L Final  . Total Protein 05/26/2018 7.0  6.0 - 8.3 g/dL Final  . Albumin 05/26/2018 4.0  3.5 - 5.2 g/dL Final  . Calcium 05/26/2018 9.8  8.4 - 10.5 mg/dL Final  . GFR 05/26/2018 48.67* >60.00 mL/min Final  . Hgb A1c MFr Bld 05/26/2018 7.1* 4.6 - 6.5 % Final   Glycemic Control Guidelines for People with Diabetes:Non Diabetic:  <6%Goal of Therapy: <7%Additional Action Suggested:  >8%     Allergies as of  08/16/2018      Reactions   Actos [pioglitazone]    hairloss   Metformin And Related Diarrhea   Novocain [procaine]    syncopy   Sulfa Antibiotics Itching      Medication List        Accurate as of 08/16/18 11:43 AM. Always use your most recent med list.            aspirin EC 81 MG tablet Take 1 tablet (81 mg total) by mouth daily.   Biotin 5000 MCG Caps Take 1 capsule by mouth 2 (two) times daily.   ezetimibe-simvastatin 10-40 MG tablet Commonly known as:  VYTORIN Take 1 tablet by mouth daily.   glucose blood test strip USE TO CHECK BLOOD SUGAR THREE TIMES DAILY AS DIRECTED   insulin lispro 100 UNIT/ML injection Commonly known as:  HUMALOG Use 74 UNITS IN V-GO PUMP DAILY   INVOKANA 300 MG Tabs tablet Generic drug:  canagliflozin TAKE 1 TABLET(300 MG) BY MOUTH DAILY BEFORE BREAKFAST   levothyroxine 112 MCG tablet Commonly known as:  SYNTHROID, LEVOTHROID Take 1 tablet (112 mcg total) by mouth daily.   nitroGLYCERIN 0.4 MG SL tablet Commonly known as:  NITROSTAT Place 1 tablet (0.4 mg total) under the tongue every 5 (five) minutes as needed for chest pain.   olmesartan 40 MG tablet Commonly known as:  BENICAR TAKE 1 TABLET(40 MG) BY MOUTH DAILY   omega-3 acid ethyl esters 1 g capsule Commonly known as:  LOVAZA Take 2 g by mouth 2 (two) times daily.   OMNIPOD DASH SYSTEM Kit 1 each by Does not apply route. USE DAILY TO CONTINUOUSLY MONITOR BLOOD SUGAR.   ONE TOUCH ULTRA MINI w/Device Kit Use to check blood sugar   SUPER B COMPLEX/C Caps Take 1 tablet by mouth 2 (two) times daily.       Allergies:  Allergies  Allergen Reactions  . Actos [Pioglitazone]     hairloss  . Metformin And Related Diarrhea  . Novocain [Procaine]     syncopy  . Sulfa Antibiotics Itching    Past Medical History:  Diagnosis Date  . Arthritis   . Complication of anesthesia    takes along time to wake up  . Contact lens/glasses fitting    wears glasses or contacts  . Coronary artery disease 01/2007   s/p PCI of diagonal   . Diabetes mellitus without complication ([place])   . Dyslipidemia   . Hyperlipidemia   . Hypertension   . Hypothyroidism   . Snores   . Vertigo     Past Surgical History:  Procedure Laterality Date  .  CARDIAC CATHETERIZATION  2008   stent  . SHOULDER ARTHROSCOPY WITH OPEN ROTATOR CUFF REPAIR Right 02/28/2013   Procedure: right shoulder arthroscopy, distal clavicle sculpting, open rotator cuff repair;  Surgeon: Cammie Sickle., MD;  Location: [facility];  Service: Orthopedics;  Laterality: Right;    Family History  Problem Relation Age of Onset  . Hypertension Father   . Heart attack Father     Social History:  reports that she has never smoked. She has never used smokeless tobacco. She reports that she drinks alcohol. She reports that she does not use drugs.  Review of Systems:  The following is a copy of the previous note  HYPERTENSION:  followed by PCP/cardiologist, blood pressure   Now on Benicar instead of losartan, taking 40 mg with better control Also urine microalbumin is improved  BP Readings from  Last 3 Encounters:  08/16/18 136/80  06/01/18 136/78  01/20/18 (!) 142/80    HYPERLIPIDEMIA: The lipid abnormality consists of elevated LDL, taking Vytorin, followed by cardiologist.   She was tried on Crestor but she thinks this caused leg pain  Her cholesterol previously was markedly increased even with reported good compliance with the medication but now it is back to excellent levels Has minimal increase in triglycerides and low HDL  Lab Results  Component Value Date   CHOL 162 05/26/2018   HDL 37.70 (L) 05/26/2018   LDLCALC 89 05/26/2018   LDLDIRECT 130.0 01/18/2018   TRIG 178.0 (H) 05/26/2018   CHOLHDL 4 05/26/2018    History of mild hypothyroidism, treated for several years Her TSH was high in 5/19 Now taking 1 5 mcg instead of 100s, not complaining of fatigue However she was without her medication for 10 days on vacation and her lab was done only about a week after restarting  TSH still high  Lab Results  Component Value Date   TSH 6.55 (H) 05/26/2018   TSH 4.89 (H) 01/18/2018   TSH 2.38 07/20/2017        Examination:   BP  136/80 (BP Location: Left Arm, Patient Position: Sitting, Cuff Size: Normal)   Pulse 61   Ht 5' 6"  (1.676 m)   Wt 196 lb 9.6 oz (89.2 kg)   SpO2 98%   BMI 31.73 kg/m   Body mass index is 31.73 kg/m.     ASSESSMENT/ PLAN:   Diabetes type 2 With BMI 31  See history of present illness for  discussion of current management, blood sugar patterns and problems identified  Her A1c last is 7.1  She is doing very well with near normal blood sugars since starting the pump yesterday evening around midday However has not done readings after meals Blood sugar is 85 at lunch today and likely needs less basal rate during the day  Recommendations: Basal rate 1.2 from 7 AM-7 PM Need to check readings 2 hours after meals to help adjust boluses She will discuss temporary basal or suspending the pump for exercise if needed Reviewed blood sugars again tomorrow She will be instructed on managing her pump and adjusting settings by nurse educator today    There are no Patient Instructions on file for this visit.     Elayne Snare 08/16/2018, 11:43 AM

## 2018-08-16 NOTE — Patient Instructions (Signed)
Test blood sugars before meals and at bedtime Call 800 number if questions on how to use the pump Read over Resource manual and Manual.

## 2018-08-16 NOTE — Patient Instructions (Signed)
Basal 1.2 at 7 am

## 2018-08-16 NOTE — Progress Notes (Addendum)
Patient had charged her PDM and was trained on how to use this.  She filled a pod with Humalog insulin, and applied it to her upper left abdomen.  She took off her V-Go at this time.  Settings were put into PDM per Dr. Ronnie Derby order:  Basal rate: MN: 1.5, 7AM: 1.3, 7PM: 1.5,  I/C: 10, ISF: 50, timing 4 hours, target 110, with correction over 120.   She says she know how to count carbs, but it has been a long time.  We will review this tomorrow.  Today she will give boluses per V=Go settings:  60 grams per meal, and 20 per snack. She re demonstrated how to bolus correctly X2 ,and had no final questions.  I will see her tomorrow when she sees Dr. Dwyane Dee.  She had no final questions.

## 2018-08-17 ENCOUNTER — Ambulatory Visit: Payer: Medicare Other | Admitting: Endocrinology

## 2018-08-17 NOTE — Patient Instructions (Signed)
Call if questions. 

## 2018-08-17 NOTE — Progress Notes (Signed)
She reported no difficulty using the pump yesterday and today.  She was shown how to change the basal rate, and reported good understanding of this.  We reviewed how/ when to use the temp basal rate, extended bolus,and alerts and alarms.  She had no questions. She signed off the checklist as understanding all topics, and had no final questions.

## 2018-08-18 ENCOUNTER — Encounter: Payer: Self-pay | Admitting: Endocrinology

## 2018-08-18 ENCOUNTER — Ambulatory Visit (INDEPENDENT_AMBULATORY_CARE_PROVIDER_SITE_OTHER): Payer: Medicare Other | Admitting: Endocrinology

## 2018-08-18 VITALS — Ht 66.0 in

## 2018-08-18 DIAGNOSIS — E1165 Type 2 diabetes mellitus with hyperglycemia: Secondary | ICD-10-CM | POA: Diagnosis not present

## 2018-08-18 DIAGNOSIS — Z794 Long term (current) use of insulin: Secondary | ICD-10-CM | POA: Diagnosis not present

## 2018-08-18 NOTE — Progress Notes (Signed)
Patient ID: Crystal Hobbs, female   DOB: 01/18/1951, 67 y.o.   MRN: [id number]   Reason for Appointment: Diabetes follow-up   History of Present Illness   Diagnosis: Type 2 DIABETES MELITUS, date of diagnosis:  2011   Past history: Her initial presentation with diabetes was with significant symptoms and glucose of 947 Initially was treated with insulin and subsequently on metformin which she could not tolerate  Since she was not responding to Victoza and had significant hyperglycemia she was started on basal bolus insulin regimen instead  However had required relatively large doses of insulin with inadequate control and blood sugars subsequently improved significantly with adding Victoza in 10/13  Her  A1c was 6.4 in 4/14 and she had overall good control with minimal hypoglycemia She was taken off  Afrezza because of her high out-of-pocket expense and she was tried on V-go pump which she had some difficulties getting this through the pharmacy  RECENT history:    Insulin regimen: OMNIPOD insulin pump BASAL rates 1.5 midnight-7 AM and also 7 PM-midnight.  Basal rate 1.2 from 7 AM-7 PM  Carbohydrate coverage 1: 10.  Preset boluses 3-5 units at breakfast 4-5 at lunch and 6 units at dinner Correction 1: 50 with target 100-120   Non-insulin hypoglycemic drugs: Invokana 300 mg daily  Her A1c in September is 7.1, previously 6.9    Current blood sugar patterns and problems identified:  She now on the OmniPod insulin pump for 4 days  FASTING readings have been fairly consistently good ranging from 104 up to 133  Blood sugars at lunch have been in the 80s consistently  However yesterday even with playing tennis late morning she did not have a low sugar with not changing her basal rate or suspending her pump  Lowest blood sugar was 74 after lunch yesterday evening after a small meal and 3.3 units bolus  Blood sugars at dinnertime were not checked yesterday but previously  98  After dinner blood sugars have been 144 and 165  No hypoglycemia  Has been checking her blood sugars 4-5 times a day   Side effects from medications:  diarrhea from metformin,? Hair loss from Actos Proper timing of medications in relation to meals: Yes         Monitors blood glucose: As above.    Glucometer:  Contour Next .          Blood Glucose readings as above  Meals: 3 meals per day.  She is trying to limit her calories and carbohydrate  Breakfast: may have half a peanut better sandwich or a granola bar.     Dinner at 6-8 pm        Physical activity: exercise: Sometimes doing walking or going to play tennis 1-3/7 days a week            Diabetes education visit: Most recent: 05/2012      CDE visit 12/19        Wt Readings from Last 3 Encounters:  08/16/18 196 lb 9.6 oz (89.2 kg)  06/01/18 195 lb (88.5 kg)  01/20/18 192 lb 12.8 oz (87.5 kg)    Lab Results  Component Value Date   HGBA1C 7.1 (H) 05/26/2018   HGBA1C 6.9 (H) 01/18/2018   HGBA1C 6.9 (H) 10/19/2017   Lab Results  Component Value Date   MICROALBUR 43.3 (H) 05/26/2018   LDLCALC 89 05/26/2018   CREATININE 1.17 05/26/2018    Other problems discussed today: See review  of systems   No visits with results within 1 Week(s) from this visit.  Latest known visit with results is:  Lab on 05/26/2018  Component Date Value Ref Range Status  . Free T4 05/26/2018 0.65  0.60 - 1.60 ng/dL Final   Comment: Specimens from patients who are undergoing biotin therapy and /or ingesting biotin supplements may contain high levels of biotin.  The higher biotin concentration in these specimens interferes with this Free T4 assay.  Specimens that contain high levels  of biotin may cause false high results for this Free T4 assay.  Please interpret results in light of the total clinical presentation of the patient.    Marland Kitchen TSH 05/26/2018 6.55* 0.35 - 4.50 uIU/mL Final  . Microalb, Ur 05/26/2018 43.3* 0.0 - 1.9 mg/dL Final  .  Creatinine,U 05/26/2018 140.1  mg/dL Final  . Microalb Creat Ratio 05/26/2018 30.9* 0.0 - 30.0 mg/g Final  . Cholesterol 05/26/2018 162  0 - 200 mg/dL Final   ATP III Classification       Desirable:  < 200 mg/dL               Borderline High:  200 - 239 mg/dL          High:  > = 240 mg/dL  . Triglycerides 05/26/2018 178.0* 0.0 - 149.0 mg/dL Final   Normal:  <150 mg/dLBorderline High:  150 - 199 mg/dL  . HDL 05/26/2018 37.70* >39.00 mg/dL Final  . VLDL 05/26/2018 35.6  0.0 - 40.0 mg/dL Final  . LDL Cholesterol 05/26/2018 89  0 - 99 mg/dL Final  . Total CHOL/HDL Ratio 05/26/2018 4   Final                  Men          Women1/2 Average Risk     3.4          3.3Average Risk          5.0          4.42X Average Risk          9.6          7.13X Average Risk          15.0          11.0                      . NonHDL 05/26/2018 124.35   Final   NOTE:  Non-HDL goal should be 30 mg/dL higher than patient's LDL goal (i.e. LDL goal of < 70 mg/dL, would have non-HDL goal of < 100 mg/dL)  . Sodium 05/26/2018 142  135 - 145 mEq/L Final  . Potassium 05/26/2018 4.5  3.5 - 5.1 mEq/L Final  . Chloride 05/26/2018 110  96 - 112 mEq/L Final  . CO2 05/26/2018 26  19 - 32 mEq/L Final  . Glucose, Bld 05/26/2018 109* 70 - 99 mg/dL Final  . BUN 05/26/2018 24* 6 - 23 mg/dL Final  . Creatinine, Ser 05/26/2018 1.17  0.40 - 1.20 mg/dL Final  . Total Bilirubin 05/26/2018 0.5  0.2 - 1.2 mg/dL Final  . Alkaline Phosphatase 05/26/2018 74  39 - 117 U/L Final  . AST 05/26/2018 19  0 - 37 U/L Final  . ALT 05/26/2018 26  0 - 35 U/L Final  . Total Protein 05/26/2018 7.0  6.0 - 8.3 g/dL Final  . Albumin 05/26/2018 4.0  3.5 - 5.2 g/dL Final  . Calcium  05/26/2018 9.8  8.4 - 10.5 mg/dL Final  . GFR 05/26/2018 48.67* >60.00 mL/min Final  . Hgb A1c MFr Bld 05/26/2018 7.1* 4.6 - 6.5 % Final   Glycemic Control Guidelines for People with Diabetes:Non Diabetic:  <6%Goal of Therapy: <7%Additional Action Suggested:  >8%     Allergies as  of 08/18/2018      Reactions   Actos [pioglitazone]    hairloss   Metformin And Related Diarrhea   Novocain [procaine]    syncopy   Sulfa Antibiotics Itching      Medication List        Accurate as of 08/18/18  2:59 PM. Always use your most recent med list.          aspirin EC 81 MG tablet Take 1 tablet (81 mg total) by mouth daily.   Biotin 5000 MCG Caps Take 1 capsule by mouth 2 (two) times daily.   ezetimibe-simvastatin 10-40 MG tablet Commonly known as:  VYTORIN Take 1 tablet by mouth daily.   glucose blood test strip 1 each by Other route as needed for other. Check blood sugar before meals, 2 hours after meals, and at HS   insulin lispro 100 UNIT/ML injection Commonly known as:  HUMALOG Use 74 UNITS IN V-GO PUMP DAILY   INVOKANA 300 MG Tabs tablet Generic drug:  canagliflozin TAKE 1 TABLET(300 MG) BY MOUTH DAILY BEFORE BREAKFAST   levothyroxine 112 MCG tablet Commonly known as:  SYNTHROID, LEVOTHROID Take 1 tablet (112 mcg total) by mouth daily.   nitroGLYCERIN 0.4 MG SL tablet Commonly known as:  NITROSTAT Place 1 tablet (0.4 mg total) under the tongue every 5 (five) minutes as needed for chest pain.   olmesartan 40 MG tablet Commonly known as:  BENICAR TAKE 1 TABLET(40 MG) BY MOUTH DAILY   omega-3 acid ethyl esters 1 g capsule Commonly known as:  LOVAZA Take 2 g by mouth 2 (two) times daily.   OMNIPOD DASH SYSTEM Kit 1 each by Does not apply route. USE DAILY TO CONTINUOUSLY MONITOR BLOOD SUGAR.   SUPER B COMPLEX/C Caps Take 1 tablet by mouth daily.       Allergies:  Allergies  Allergen Reactions  . Actos [Pioglitazone]     hairloss  . Metformin And Related Diarrhea  . Novocain [Procaine]     syncopy  . Sulfa Antibiotics Itching    Past Medical History:  Diagnosis Date  . Arthritis   . Complication of anesthesia    takes along time to wake up  . Contact lens/glasses fitting    wears glasses or contacts  . Coronary artery disease  01/2007   s/p PCI of diagonal   . Diabetes mellitus without complication ([place])   . Dyslipidemia   . Hyperlipidemia   . Hypertension   . Hypothyroidism   . Snores   . Vertigo     Past Surgical History:  Procedure Laterality Date  . CARDIAC CATHETERIZATION  2008   stent  . SHOULDER ARTHROSCOPY WITH OPEN ROTATOR CUFF REPAIR Right 02/28/2013   Procedure: right shoulder arthroscopy, distal clavicle sculpting, open rotator cuff repair;  Surgeon: Cammie Sickle., MD;  Location: [facility];  Service: Orthopedics;  Laterality: Right;    Family History  Problem Relation Age of Onset  . Hypertension Father   . Heart attack Father     Social History:  reports that she has never smoked. She has never used smokeless tobacco. She reports that she drinks alcohol. She reports that  she does not use drugs.  Review of Systems:  The following is a copy of the previous note  HYPERTENSION:  followed by PCP/cardiologist, blood pressure   Now on Benicar instead of losartan, taking 40 mg with better control Also urine microalbumin is improved  BP Readings from Last 3 Encounters:  08/16/18 136/80  06/01/18 136/78  01/20/18 (!) 142/80    HYPERLIPIDEMIA: The lipid abnormality consists of elevated LDL, taking Vytorin, followed by cardiologist.   She was tried on Crestor but she thinks this caused leg pain  Her cholesterol previously was markedly increased even with reported good compliance with the medication but now it is back to excellent levels Has minimal increase in triglycerides and low HDL  Lab Results  Component Value Date   CHOL 162 05/26/2018   HDL 37.70 (L) 05/26/2018   LDLCALC 89 05/26/2018   LDLDIRECT 130.0 01/18/2018   TRIG 178.0 (H) 05/26/2018   CHOLHDL 4 05/26/2018    History of mild hypothyroidism, treated for several years Her TSH was high in 5/19 Now taking 1 5 mcg instead of 100s, not complaining of fatigue However she was without her medication for  10 days on vacation and her lab was done only about a week after restarting  TSH still high  Lab Results  Component Value Date   TSH 6.55 (H) 05/26/2018   TSH 4.89 (H) 01/18/2018   TSH 2.38 07/20/2017        Examination:   Ht 5' 6"  (1.676 m)   BMI 31.73 kg/m   Body mass index is 31.73 kg/m.     ASSESSMENT/ PLAN:   Diabetes type 2 With BMI 31  See history of present illness for  discussion of current management, blood sugar patterns and problems identified  Her A1c last is 7.1  She is continuing to do very well with the OmniPod insulin pump started on Monday Blood sugars are near normal most of the time and the highest reading of 155 was right after dinner when she did not bolus As before she is requiring more insulin during the night and appears to be requiring even less insulin midday with lowest blood sugar 74 after exercise yesterday Postprandial readings are well controlled and she is trying to make adjustments in her boluses based on what she is eating   Recommendations: Basal rate 1.1 from 10 AM-2 PM otherwise she will continue the same setting She was shown how to log into her record on the OmniPod website Reminded her to use a temporary basal or suspending the pump with exercise To try and bolus consistently before eating  Follow-up as scheduled    There are no Patient Instructions on file for this visit.     Elayne Snare 08/18/2018, 2:59 PM

## 2018-08-23 ENCOUNTER — Other Ambulatory Visit: Payer: Self-pay

## 2018-08-23 MED ORDER — GLUCOSE BLOOD VI STRP: 1.0000 | ORAL_STRIP | 3 refills | Status: DC | PRN

## 2018-08-29 ENCOUNTER — Other Ambulatory Visit (INDEPENDENT_AMBULATORY_CARE_PROVIDER_SITE_OTHER): Payer: Medicare Other

## 2018-08-29 DIAGNOSIS — E063 Autoimmune thyroiditis: Secondary | ICD-10-CM

## 2018-08-29 DIAGNOSIS — E1165 Type 2 diabetes mellitus with hyperglycemia: Secondary | ICD-10-CM

## 2018-08-29 DIAGNOSIS — Z794 Long term (current) use of insulin: Secondary | ICD-10-CM

## 2018-08-29 LAB — TSH: TSH: 2.12 u[IU]/mL (ref 0.35–4.50)

## 2018-08-29 LAB — COMPREHENSIVE METABOLIC PANEL
ALT: 18 U/L (ref 0–35)
AST: 15 U/L (ref 0–37)
Albumin: 4.3 g/dL (ref 3.5–5.2)
Alkaline Phosphatase: 81 U/L (ref 39–117)
BUN: 26 mg/dL — ABNORMAL HIGH (ref 6–23)
CO2: 25 mEq/L (ref 19–32)
Calcium: 10.2 mg/dL (ref 8.4–10.5)
Chloride: 108 mEq/L (ref 96–112)
Creatinine, Ser: 1.27 mg/dL — ABNORMAL HIGH (ref 0.40–1.20)
GFR: 44.24 mL/min — AB (ref >60.00)
Glucose, Bld: 117 mg/dL — ABNORMAL HIGH (ref 70–99)
Potassium: 4.9 mEq/L (ref 3.5–5.1)
Sodium: 141 mEq/L (ref 135–145)
Total Bilirubin: 0.4 mg/dL (ref 0.2–1.2)
Total Protein: 7.6 g/dL (ref 6.0–8.3)

## 2018-08-29 LAB — HEMOGLOBIN A1C: HEMOGLOBIN A1C: 7.4 % — AB (ref 4.6–6.5)

## 2018-08-29 LAB — T4, FREE: Free T4: 1.24 ng/dL (ref 0.60–1.60)

## 2018-09-01 ENCOUNTER — Ambulatory Visit: Payer: Medicare Other | Admitting: Endocrinology

## 2018-09-05 ENCOUNTER — Ambulatory Visit: Payer: Medicare Other | Admitting: Endocrinology

## 2018-09-05 ENCOUNTER — Encounter: Payer: Self-pay | Admitting: Endocrinology

## 2018-09-05 ENCOUNTER — Telehealth: Payer: Self-pay

## 2018-09-05 VITALS — BP 134/80 | HR 80 | Ht 66.0 in | Wt 196.2 lb

## 2018-09-05 DIAGNOSIS — E1165 Type 2 diabetes mellitus with hyperglycemia: Secondary | ICD-10-CM

## 2018-09-05 DIAGNOSIS — E063 Autoimmune thyroiditis: Secondary | ICD-10-CM

## 2018-09-05 DIAGNOSIS — I10 Essential (primary) hypertension: Secondary | ICD-10-CM

## 2018-09-05 DIAGNOSIS — Z794 Long term (current) use of insulin: Secondary | ICD-10-CM

## 2018-09-05 NOTE — Telephone Encounter (Signed)
Called [employer] and got PA approved for Contour Next test strips. PA approval good until 09/03/2019 PA approval number: [id number]

## 2018-09-05 NOTE — Progress Notes (Signed)
Patient ID: Crystal Hobbs, female   DOB: [birth year], 67 y.o.   MRN: [id number]   Reason for Appointment: Diabetes follow-up   History of Present Illness   Diagnosis: Type 2 DIABETES MELITUS, date of diagnosis:  2011   Past history: Her initial presentation with diabetes was with significant symptoms and glucose of 947 Initially was treated with insulin and subsequently on metformin which she could not tolerate  Since she was not responding to Victoza and had significant hyperglycemia she was started on basal bolus insulin regimen instead  However had required relatively large doses of insulin with inadequate control and blood sugars subsequently improved significantly with adding Victoza in 10/13  Her  A1c was 6.4 in 4/14 and she had overall good control with minimal hypoglycemia She was taken off  Afrezza because of her high out-of-pocket expense and she was tried on V-go pump which she had some difficulties getting this through the pharmacy  RECENT history:    Insulin regimen: OMNIPOD insulin pump started on 08/16/2018  BASAL rates 1.5 midnight-7 AM, 7 AM = 1.2, 10 AM = 1.1, 2 PM = 1.2 and 7 PM = 1.5  Carbohydrate coverage 1: 10.  Preset boluses 3-5 units at breakfast 4-5 at lunch and 6 units at dinner Correction 1: 50 with target 100-120   Non-insulin hypoglycemic drugs: Invokana 300 mg daily  Her A1c is 7.4 compared to 7.1  Current blood sugar patterns and problems identified:  She now on the OmniPod insulin pump for most of this month  FASTING readings have been somewhat variable and appear to be slightly higher earlier in the morning 2 weeks  Blood sugars are not as low before lunch with reducing her midday basal rate  However she has not checked readings AFTER meals  Over the last few days has had sporadic high readings based on her diet  Also has not exercised  The last time she exercised with playing tennis she suspended her pump without any  difficulties  No hypoglycemia at any time  Has been checking her blood sugars 4-5 times a day   Side effects from medications:  diarrhea from metformin,? Hair loss from Actos Proper timing of medications in relation to meals: Yes         Monitors blood glucose: As above.    Glucometer:  Contour Next .          Blood Glucose readings    PRE-MEAL Fasting Lunch Dinner Bedtime Overall  Glucose range:  106-168 9 7-198  80-203 ?   Mean/median:  140     133+/-32   POST-MEAL PC Breakfast PC Lunch PC Dinner  Glucose range:   ?  Mean/median:        Meals: 3 meals per day.  She is trying to limit her calories and carbohydrate  Breakfast: may have half a peanut better sandwich or a granola bar.     Dinner at 6-8 pm        Physical activity: exercise: Sometimes doing walking or going to play tennis 1-3/7 days a week            Diabetes education visit: Most recent: 05/2012      CDE visit 12/19        Wt Readings from Last 3 Encounters:  09/05/18 196 lb 3.2 oz (89 kg)  08/16/18 196 lb 9.6 oz (89.2 kg)  06/01/18 195 lb (88.5 kg)    Lab Results  Component Value Date  HGBA1C 7.4 (H) 08/29/2018   HGBA1C 7.1 (H) 05/26/2018   HGBA1C 6.9 (H) 01/18/2018   Lab Results  Component Value Date   MICROALBUR 43.3 (H) 05/26/2018   LDLCALC 89 05/26/2018   CREATININE 1.27 (H) 08/29/2018    Other problems discussed today: See review of systems   No visits with results within 1 Week(s) from this visit.  Latest known visit with results is:  Lab on 08/29/2018  Component Date Value Ref Range Status  . Free T4 08/29/2018 1.24  0.60 - 1.60 ng/dL Final   Comment: Specimens from patients who are undergoing biotin therapy and /or ingesting biotin supplements may contain high levels of biotin.  The higher biotin concentration in these specimens interferes with this Free T4 assay.  Specimens that contain high levels  of biotin may cause false high results for this Free T4 assay.  Please  interpret results in light of the total clinical presentation of the patient.    Marland Kitchen TSH 08/29/2018 2.12  0.35 - 4.50 uIU/mL Final  . Sodium 08/29/2018 141  135 - 145 mEq/L Final  . Potassium 08/29/2018 4.9  3.5 - 5.1 mEq/L Final  . Chloride 08/29/2018 108  96 - 112 mEq/L Final  . CO2 08/29/2018 25  19 - 32 mEq/L Final  . Glucose, Bld 08/29/2018 117* 70 - 99 mg/dL Final  . BUN 08/29/2018 26* 6 - 23 mg/dL Final  . Creatinine, Ser 08/29/2018 1.27* 0.40 - 1.20 mg/dL Final  . Total Bilirubin 08/29/2018 0.4  0.2 - 1.2 mg/dL Final  . Alkaline Phosphatase 08/29/2018 81  39 - 117 U/L Final  . AST 08/29/2018 15  0 - 37 U/L Final  . ALT 08/29/2018 18  0 - 35 U/L Final  . Total Protein 08/29/2018 7.6  6.0 - 8.3 g/dL Final  . Albumin 08/29/2018 4.3  3.5 - 5.2 g/dL Final  . Calcium 08/29/2018 10.2  8.4 - 10.5 mg/dL Final  . GFR 08/29/2018 44.24* >60.00 mL/min Final  . Hgb A1c MFr Bld 08/29/2018 7.4* 4.6 - 6.5 % Final   Glycemic Control Guidelines for People with Diabetes:Non Diabetic:  <6%Goal of Therapy: <7%Additional Action Suggested:  >8%     Allergies as of 09/05/2018      Reactions   Actos [pioglitazone]    hairloss   Metformin And Related Diarrhea   Novocain [procaine]    syncopy   Sulfa Antibiotics Itching      Medication List       Accurate as of September 05, 2018  2:37 PM. Always use your most recent med list.        aspirin EC 81 MG tablet Take 1 tablet (81 mg total) by mouth daily.   Biotin 5000 MCG Caps Take 1 capsule by mouth daily.   ezetimibe-simvastatin 10-40 MG tablet Commonly known as:  VYTORIN Take 1 tablet by mouth daily.   glucose blood test strip Commonly known as:  CONTOUR NEXT TEST 1 each by Other route as needed for other. Check blood sugar before meals, 2 hours after meals, and at HS   insulin lispro 100 UNIT/ML injection Commonly known as:  HUMALOG Use 74 UNITS IN V-GO PUMP DAILY   INVOKANA 300 MG Tabs tablet Generic drug:  canagliflozin TAKE 1  TABLET(300 MG) BY MOUTH DAILY BEFORE BREAKFAST   levothyroxine 112 MCG tablet Commonly known as:  SYNTHROID, LEVOTHROID Take 1 tablet (112 mcg total) by mouth daily.   nitroGLYCERIN 0.4 MG SL tablet Commonly known as:  NITROSTAT  Place 1 tablet (0.4 mg total) under the tongue every 5 (five) minutes as needed for chest pain.   olmesartan 40 MG tablet Commonly known as:  BENICAR TAKE 1 TABLET(40 MG) BY MOUTH DAILY   omega-3 acid ethyl esters 1 g capsule Commonly known as:  LOVAZA Take 2 g by mouth 2 (two) times daily.   OMNIPOD DASH SYSTEM Kit 1 each by Does not apply route. USE DAILY TO CONTINUOUSLY MONITOR BLOOD SUGAR.   SUPER B COMPLEX/C Caps Take 1 tablet by mouth daily.       Allergies:  Allergies  Allergen Reactions  . Actos [Pioglitazone]     hairloss  . Metformin And Related Diarrhea  . Novocain [Procaine]     syncopy  . Sulfa Antibiotics Itching    Past Medical History:  Diagnosis Date  . Arthritis   . Complication of anesthesia    takes along time to wake up  . Contact lens/glasses fitting    wears glasses or contacts  . Coronary artery disease 01/2007   s/p PCI of diagonal   . Diabetes mellitus without complication ([place])   . Dyslipidemia   . Hyperlipidemia   . Hypertension   . Hypothyroidism   . Snores   . Vertigo     Past Surgical History:  Procedure Laterality Date  . CARDIAC CATHETERIZATION  2008   stent  . SHOULDER ARTHROSCOPY WITH OPEN ROTATOR CUFF REPAIR Right 02/28/2013   Procedure: right shoulder arthroscopy, distal clavicle sculpting, open rotator cuff repair;  Surgeon: Cammie Sickle., MD;  Location: [facility];  Service: Orthopedics;  Laterality: Right;    Family History  Problem Relation Age of Onset  . Hypertension Father   . Heart attack Father     Social History:  reports that she has never smoked. She has never used smokeless tobacco. She reports current alcohol use. She reports that she does not use  drugs.  Review of Systems:  HYPERTENSION:  followed by PCP/cardiologist, blood pressure   Is on Benicar 40 mg instead of losartan  Also urine microalbumin is improved  BP Readings from Last 3 Encounters:  09/05/18 134/80  08/16/18 136/80  06/01/18 136/78    HYPERLIPIDEMIA: The lipid abnormality consists of elevated LDL, taking Vytorin, followed by cardiologist.   She was tried on Crestor but she thinks this caused leg pain  Her cholesterol is back to excellent levels Has minimal increase in triglycerides and low HDL  Lab Results  Component Value Date   CHOL 162 05/26/2018   HDL 37.70 (L) 05/26/2018   LDLCALC 89 05/26/2018   LDLDIRECT 130.0 01/18/2018   TRIG 178.0 (H) 05/26/2018   CHOLHDL 4 05/26/2018    History of mild hypothyroidism, treated for several years Her TSH was high in 5/19  She is now taking 112 mcg levothyroxine consistently  TSH is back to normal  Lab Results  Component Value Date   TSH 2.12 08/29/2018   TSH 6.55 (H) 05/26/2018   TSH 4.89 (H) 01/18/2018        Examination:   BP 134/80 (BP Location: Left Arm, Patient Position: Sitting, Cuff Size: Normal)   Pulse 80   Ht 5' 6"  (1.676 m)   Wt 196 lb 3.2 oz (89 kg)   SpO2 97%   BMI 31.67 kg/m   Body mass index is 31.67 kg/m.     ASSESSMENT/ PLAN:   Diabetes type 2 With BMI 31  See history of present illness for  discussion of  current management, blood sugar patterns and problems identified  Her A1c is relatively higher at 7.4  She is doing better with the OmniPod pump compared to her previous regimen Recent blood sugar average is about 130 pre-meal She had no difficulty using the pump and checking her sugars as well as bolusing Occasionally may have a high reading from inadequate bolusing However recent early morning blood sugars appear to be still higher than target of 130 or less Also on Invokana   Recommendations: Basal rate 1.6 midnight-7 AM To check more readings after  meals She will need to restart exercise Discussed suspending the pump for temporary basal when exercising Showed her how the freestyle libre could be useful and how this works. Since she is checking her sugar 4 times a day she should be able to get this approved this will be helpful in her diabetes management and allow analysis of her blood sugar patterns including overnight  No change in boluses or Invokana  HYPERTENSION blood pressure is controlled  HYPOTHYROIDISM: Currently doing well with 112 Synthroid and TSH is back to normal  Mild increase in creatinine: We will continue to monitor as her Invokana or Benicar have not been changed lately  Counseling time on subjects discussed in assessment and plan sections is over 50% of today's 25 minute visit     There are no Patient Instructions on file for this visit.     Crystal Hobbs 09/05/2018, 2:37 PM

## 2018-09-08 ENCOUNTER — Other Ambulatory Visit: Payer: Self-pay | Admitting: Endocrinology

## 2018-10-02 ENCOUNTER — Other Ambulatory Visit: Payer: Self-pay | Admitting: Cardiology

## 2018-10-08 ENCOUNTER — Other Ambulatory Visit: Payer: Self-pay | Admitting: Endocrinology

## 2018-11-15 ENCOUNTER — Other Ambulatory Visit: Payer: Self-pay | Admitting: Endocrinology

## 2018-12-05 ENCOUNTER — Other Ambulatory Visit: Payer: Self-pay

## 2018-12-05 ENCOUNTER — Other Ambulatory Visit (INDEPENDENT_AMBULATORY_CARE_PROVIDER_SITE_OTHER): Payer: Medicare Other

## 2018-12-05 DIAGNOSIS — E1165 Type 2 diabetes mellitus with hyperglycemia: Secondary | ICD-10-CM | POA: Diagnosis not present

## 2018-12-05 DIAGNOSIS — Z794 Long term (current) use of insulin: Secondary | ICD-10-CM | POA: Diagnosis not present

## 2018-12-05 LAB — BASIC METABOLIC PANEL
BUN: 26 mg/dL — ABNORMAL HIGH (ref 6–23)
CO2: 27 mEq/L (ref 19–32)
Calcium: 10.2 mg/dL (ref 8.4–10.5)
Chloride: 105 mEq/L (ref 96–112)
Creatinine, Ser: 1.18 mg/dL (ref 0.40–1.20)
GFR: 45.27 mL/min — ABNORMAL LOW (ref >60.00)
Glucose, Bld: 117 mg/dL — ABNORMAL HIGH (ref 70–99)
Potassium: 4.9 mEq/L (ref 3.5–5.1)
Sodium: 138 mEq/L (ref 135–145)

## 2018-12-05 LAB — HEMOGLOBIN A1C: Hgb A1c MFr Bld: 7.7 % — ABNORMAL HIGH (ref 4.6–6.5)

## 2018-12-08 ENCOUNTER — Ambulatory Visit: Payer: Medicare Other | Admitting: Endocrinology

## 2018-12-12 ENCOUNTER — Other Ambulatory Visit: Payer: Self-pay

## 2018-12-12 ENCOUNTER — Encounter: Payer: Self-pay | Admitting: Endocrinology

## 2018-12-12 ENCOUNTER — Encounter: Payer: Medicare Other | Admitting: Endocrinology

## 2018-12-18 ENCOUNTER — Other Ambulatory Visit: Payer: Self-pay | Admitting: Endocrinology

## 2018-12-18 NOTE — Progress Notes (Signed)
This encounter was created in error - please disregard.

## 2018-12-19 ENCOUNTER — Telehealth: Payer: Self-pay | Admitting: Nutrition

## 2018-12-19 NOTE — Telephone Encounter (Signed)
Patient reports that she was on the phone with pump company  for 3 hours, and was not able to download her pump.  They said it was her computers, and then the tried to set up her phone to do this, but she did not have there right cord for this.  She has ordered it, but it will not be her for 2 weeks. I offered for her to drive up here tomorrow, and to call me, and I will go out to her car and get the pumps and CGM to download.  She agreed to do this and will be her tomorrow morning for this.

## 2018-12-19 NOTE — Telephone Encounter (Signed)
Thanks.  Need to figure out whether she can do WebEx appointment or my chart

## 2018-12-19 NOTE — Telephone Encounter (Signed)
Could you please see if this pt could do one of these options?

## 2018-12-20 ENCOUNTER — Ambulatory Visit (INDEPENDENT_AMBULATORY_CARE_PROVIDER_SITE_OTHER): Payer: Medicare Other | Admitting: Endocrinology

## 2018-12-20 DIAGNOSIS — E1165 Type 2 diabetes mellitus with hyperglycemia: Secondary | ICD-10-CM

## 2018-12-20 DIAGNOSIS — Z794 Long term (current) use of insulin: Secondary | ICD-10-CM | POA: Diagnosis not present

## 2018-12-20 NOTE — Telephone Encounter (Signed)
I met patient at her car and downloaded her pump.  Given to [employer]

## 2018-12-20 NOTE — Progress Notes (Signed)
Patient ID: MYRAKLE WINGLER, female   DOB: [birth year], 68 y.o.   MRN: [id number]   Reason for Appointment: Diabetes follow-up    Today's office visit was provided via telemedicine using video technique . Consent for the patient has been obtained . Location of the patient: Home . Location of the provider: Office Only the patient and myself were participating in the encounter    History of Present Illness   Diagnosis: Type 2 DIABETES MELITUS, date of diagnosis:  2011   Past history: Her initial presentation with diabetes was with significant symptoms and glucose of 947 Initially was treated with insulin and subsequently on metformin which she could not tolerate  Since she was not responding to Victoza and had significant hyperglycemia she was started on basal bolus insulin regimen instead  However had required relatively large doses of insulin with inadequate control and blood sugars subsequently improved significantly with adding Victoza in 10/13  Her  A1c was 6.4 in 4/14 and she had overall good control with minimal hypoglycemia She was taken off  Afrezza because of her high out-of-pocket expense and she was tried on V-go pump which she had some difficulties getting this through the pharmacy  RECENT history:    Insulin regimen: OMNIPOD insulin pump started on 08/16/2018  BASAL rates 1.6 midnight-7 AM, 7 AM = 1.2, 10 AM = 1.1, 2 PM = 1.2 and 7 PM = 1.5, total basal 33 units  Carbohydrate coverage 1: 10.  Preset boluses 3-5 units at breakfast 4-5 at lunch and 6 units at dinner Correction 1: 50 with target 100-120   Non-insulin hypoglycemic drugs: Invokana 300 mg daily  Her A1c is 7.7 and appears to be progressively getting higher  Current blood sugar patterns and problems identified:  She has continued on the OmniPod insulin pump  However for some reason her download for the last 2 weeks has only data for the first week  Also no data on her blood sugars are  available and she could not accurately review or make out what her blood sugars are the last few days  FASTING readings have been relatively better although occasionally above target and averaging about 140  Although she is trying to do her walking she does not appear to have lost weight  She is not to be bolusing consistently at lunchtime from her download  Most of the readings she has listed after meals are higher than target  She is able to continue the Omni pod without difficulties but is usually not using the Contour meter  No hypoglycemia currently with exercise, has been advised to suspend her pump for significant exercise previously  Has been checking her blood sugars 4-5 times a day but limited information available on home records today  Physical activity: exercise: Mostly walking, previously was trying to play tennis 1-3/7 days a week             Side effects from medications:  diarrhea from metformin,? Hair loss from Actos Proper timing of medications in relation to meals: Yes         Monitors blood glucose: As above.    Glucometer:  Contour Next/One Touch ultra .          Blood Glucose readings from pump download and meter review   PRE-MEAL Fasting Lunch Dinner Bedtime Overall  Glucose range:  110-157  95, 117  90-209   95-209  Mean/median:        POST-MEAL PC Breakfast PC Lunch  PC Dinner  Glucose range:   198  176, 202  Mean/median:      PREVIOUS readings:  PRE-MEAL Fasting Lunch Dinner Bedtime Overall  Glucose range:  106-168 9 7-198  80-203 ?   Mean/median:  140     133+/-32    Meals: 3 meals per day.  She is trying to limit her calories and carbohydrate  Breakfast: may have half a peanut better sandwich or a granola bar.     Dinner at 6-8 pm         Diabetes education visit: Most recent: 05/2012      CDE visit 12/19        Wt Readings from Last 3 Encounters:  09/05/18 196 lb 3.2 oz (89 kg)  08/16/18 196 lb 9.6 oz (89.2 kg)  06/01/18 195 lb (88.5  kg)    Lab Results  Component Value Date   HGBA1C 7.7 (H) 12/05/2018   HGBA1C 7.4 (H) 08/29/2018   HGBA1C 7.1 (H) 05/26/2018   Lab Results  Component Value Date   MICROALBUR 43.3 (H) 05/26/2018   LDLCALC 89 05/26/2018   CREATININE 1.18 12/05/2018    Other problems discussed today: See review of systems   No visits with results within 1 Week(s) from this visit.  Latest known visit with results is:  Lab on 12/05/2018  Component Date Value Ref Range Status  . Sodium 12/05/2018 138  135 - 145 mEq/L Final  . Potassium 12/05/2018 4.9  3.5 - 5.1 mEq/L Final  . Chloride 12/05/2018 105  96 - 112 mEq/L Final  . CO2 12/05/2018 27  19 - 32 mEq/L Final  . Glucose, Bld 12/05/2018 117* 70 - 99 mg/dL Final  . BUN 12/05/2018 26* 6 - 23 mg/dL Final  . Creatinine, Ser 12/05/2018 1.18  0.40 - 1.20 mg/dL Final  . Calcium 12/05/2018 10.2  8.4 - 10.5 mg/dL Final  . GFR 12/05/2018 45.27* >60.00 mL/min Final  . Hgb A1c MFr Bld 12/05/2018 7.7* 4.6 - 6.5 % Final   Glycemic Control Guidelines for People with Diabetes:Non Diabetic:  <6%Goal of Therapy: <7%Additional Action Suggested:  >8%     Allergies as of 12/20/2018      Reactions   Actos [pioglitazone]    hairloss   Metformin And Related Diarrhea   Novocain [procaine]    syncopy   Sulfa Antibiotics Itching      Medication List       Accurate as of December 20, 2018  3:29 PM. Always use your most recent med list.        aspirin EC 81 MG tablet Take 1 tablet (81 mg total) by mouth daily.   Biotin 5000 MCG Caps Take 1 capsule by mouth daily.   ezetimibe-simvastatin 10-40 MG tablet Commonly known as:  VYTORIN Take 1 tablet by mouth daily.   glucose blood test strip Commonly known as:  Contour Next Test 1 each by Other route as needed for other. Check blood sugar before meals, 2 hours after meals, and at HS   insulin lispro 100 UNIT/ML injection Commonly known as:  HumaLOG Use 74 UNITS IN V-GO PUMP DAILY   Invokana 300 MG Tabs  tablet Generic drug:  canagliflozin TAKE 1 TABLET(300 MG) BY MOUTH DAILY BEFORE BREAKFAST   levothyroxine 112 MCG tablet Commonly known as:  SYNTHROID, LEVOTHROID Take 1 tablet (112 mcg total) by mouth daily.   nitroGLYCERIN 0.4 MG SL tablet Commonly known as:  NITROSTAT Place 1 tablet (0.4 mg total) under the tongue  every 5 (five) minutes as needed for chest pain.   olmesartan 40 MG tablet Commonly known as:  BENICAR TAKE 1 TABLET(40 MG) BY MOUTH DAILY. Please make annual appt for future refills. [phone]. Thank you.   omega-3 acid ethyl esters 1 g capsule Commonly known as:  LOVAZA Take 2 g by mouth 2 (two) times daily.   OmniPod Dash System Kit 1 each by Does not apply route. USE DAILY TO CONTINUOUSLY MONITOR BLOOD SUGAR.   Super B Complex/C Caps Take 1 tablet by mouth daily.       Allergies:  Allergies  Allergen Reactions  . Actos [Pioglitazone]     hairloss  . Metformin And Related Diarrhea  . Novocain [Procaine]     syncopy  . Sulfa Antibiotics Itching    Past Medical History:  Diagnosis Date  . Arthritis   . Complication of anesthesia    takes along time to wake up  . Contact lens/glasses fitting    wears glasses or contacts  . Coronary artery disease 01/2007   s/p PCI of diagonal   . Diabetes mellitus without complication ([facility])   . Dyslipidemia   . Hyperlipidemia   . Hypertension   . Hypothyroidism   . Snores   . Vertigo     Past Surgical History:  Procedure Laterality Date  . CARDIAC CATHETERIZATION  2008   stent  . SHOULDER ARTHROSCOPY WITH OPEN ROTATOR CUFF REPAIR Right 02/28/2013   Procedure: right shoulder arthroscopy, distal clavicle sculpting, open rotator cuff repair;  Surgeon: Cammie Sickle., MD;  Location: [facility];  Service: Orthopedics;  Laterality: Right;    Family History  Problem Relation Age of Onset  . Hypertension Father   . Heart attack Father     Social History:  reports that she has never  smoked. She has never used smokeless tobacco. She reports current alcohol use. She reports that she does not use drugs.  Review of Systems:  HYPERTENSION:  followed by PCP/cardiologist, blood pressure not checked today  Is on Benicar 40 mg  Last urine microalbumin is improved  BP Readings from Last 3 Encounters:  09/05/18 134/80  08/16/18 136/80  06/01/18 136/78    HYPERLIPIDEMIA: The lipid abnormality consists of elevated LDL, taking Vytorin, followed by cardiologist.   She was tried on Crestor but she thinks this caused leg pain  Has minimal increase in triglycerides and low HDL Last LDL was below 100  Lab Results  Component Value Date   CHOL 162 05/26/2018   HDL 37.70 (L) 05/26/2018   LDLCALC 89 05/26/2018   LDLDIRECT 130.0 01/18/2018   TRIG 178.0 (H) 05/26/2018   CHOLHDL 4 05/26/2018    History of mild hypothyroidism, treated for several years Her TSH was high in 5/19  She is taking 112 mcg levothyroxine and last TSH was normal  No recent fatigue  Lab Results  Component Value Date   TSH 2.12 08/29/2018   TSH 6.55 (H) 05/26/2018   TSH 4.89 (H) 01/18/2018        Examination:   There were no vitals taken for this visit.  There is no height or weight on file to calculate BMI.     ASSESSMENT/ PLAN:   Diabetes type 2 With BMI 31  See history of present illness for  discussion of current management, blood sugar patterns and problems identified  Her A1c is relatively higher at 7.7  Not clear why her A1c continues to go up Appears to be not  monitoring readings enough after meals With increasing her overnight basal rate on her last visit her fasting readings are relatively better but not consistently This may also depend on her blood sugar the night before  Although she has been recommended the freestyle libre sensor for better analysis of her blood sugar patterns she is trying to use of her One Touch test strips as required by her insurance However unable  to get her to read off her readings and averages from her meter today She is trying to walk for exercise although has done better with exercise regimen previously   Recommendations: Most likely she needs at least 10 to 10 g of carbohydrates more added to her evening meal to control postprandial readings, this can be added manually as currently she is not able to change her 3 sets on her own She needs to check more readings after meals to help adjust her boluses Blood sugar target of under 160 after meals Also discussed how her freestyle libre would be useful for evaluating postprandial and overnight blood sugars She will again adjust her boluses based on amount of carbohydrate per meal Also have discussed how to upload her pump for her next visit at home  Renal function: Stable and improved, can continue Benicar unchanged  Lipids and thyroid levels need to be checked on her next visit  Total visit time for evaluation and management, review of relevant sections of her previous medical records in preparation for the visit, downloading patient's data, getting history of her blood sugars and counseling =25 minutes   There are no Patient Instructions on file for this visit.     Elayne Snare 12/20/2018, 3:29 PM

## 2018-12-21 ENCOUNTER — Other Ambulatory Visit: Payer: Self-pay

## 2018-12-31 ENCOUNTER — Other Ambulatory Visit: Payer: Self-pay | Admitting: Cardiology

## 2018-12-31 ENCOUNTER — Other Ambulatory Visit: Payer: Self-pay | Admitting: Endocrinology

## 2019-01-13 ENCOUNTER — Telehealth: Payer: Self-pay | Admitting: Cardiology

## 2019-01-13 NOTE — Telephone Encounter (Signed)
[email or web address]/smartphone /[phone]/verbal consent 01/13/19/virals  .YOUR CARDIOLOGY TEAM HAS ARRANGED FOR AN E-VISIT FOR YOUR APPOINTMENT - PLEASE REVIEW IMPORTANT INFORMATION BELOW SEVERAL DAYS PRIOR TO YOUR APPOINTMENT  Due to the recent COVID-19 pandemic, we are transitioning in-person office visits to tele-medicine visits in an effort to decrease unnecessary exposure to our patients, their families, and staff. These visits are billed to your insurance just like a normal visit is. We also encourage you to sign up for MyChart if you have not already done so. You will need a smartphone if possible. For patients that do not have this, we can still complete the visit using a regular telephone but do prefer a smartphone to enable video when possible. You may have a family member that lives with you that can help. If possible, we also ask that you have a blood pressure cuff and scale at home to measure your blood pressure, heart rate and weight prior to your scheduled appointment. Patients with clinical needs that need an in-person evaluation and testing will still be able to come to the office if absolutely necessary. If you have any questions, feel free to call our office.     YOUR PROVIDER WILL BE USING THE FOLLOWING PLATFORM TO COMPLETE YOUR VISIT:  [email or web address]   IF USING MYCHART - How to Download the MyChart App to Your SmartPhone   - If Apple, go to [employer] and type in MyChart in the search bar and download the app. If Android, ask patient to go to [employer] and type in [place] in the search bar and download the app. The app is free but as with any other app downloads, your phone may require you to verify saved payment information or Apple/Android password.  - You will need to then log into the app with your MyChart username and password, and select [facility] as your healthcare provider to link the account.  - When it is time for your visit, go to the MyChart app, find appointments,  and click Begin Video Visit. Be sure to Select Allow for your device to access the Microphone and Camera for your visit. You will then be connected, and your provider will be with you shortly.  **If you have any issues connecting or need assistance, please contact MyChart service desk [phone])**  **If using a computer, in order to ensure the best quality for your visit, you will need to use either of the following Internet Browsers: Insurance underwriter or Microsoft Edge**   IF USING DOXIMITY or [email or web address] - The staff will give you instructions on receiving your link to join the meeting the day of your visit.      2-3 DAYS BEFORE YOUR APPOINTMENT  You will receive a telephone call from one of our [facility] team members - your caller ID may say "Unknown caller." If this is a video visit, we will walk you through how to get the video launched on your phone. We will remind you check your blood pressure, heart rate and weight prior to your scheduled appointment. If you have an Apple Watch or Kardia, please upload any pertinent ECG strips the day before or morning of your appointment to [facility]. Our staff will also make sure you have reviewed the consent and agree to move forward with your scheduled tele-health visit.     THE DAY OF YOUR APPOINTMENT  Approximately 15 minutes prior to your scheduled appointment, you will receive a telephone call from one of [facility] team - your  caller ID may say "Unknown caller."  Our staff will confirm medications, vital signs for the day and any symptoms you may be experiencing. Please have this information available prior to the time of visit start. It may also be helpful for you to have a pad of paper and pen handy for any instructions given during your visit. They will also walk you through joining the smartphone meeting if this is a video visit.    CONSENT FOR TELE-HEALTH VISIT - PLEASE REVIEW  I hereby voluntarily request, consent and authorize  [facility] and its employed or contracted physicians, physician assistants, nurse practitioners or other licensed health care professionals (the Practitioner), to provide me with telemedicine health care services (the Services") as deemed necessary by the treating Practitioner. I acknowledge and consent to receive the Services by the Practitioner via telemedicine. I understand that the telemedicine visit will involve communicating with the Practitioner through live audiovisual communication technology and the disclosure of certain medical information by electronic transmission. I acknowledge that I have been given the opportunity to request an in-person assessment or other available alternative prior to the telemedicine visit and am voluntarily participating in the telemedicine visit.  I understand that I have the right to withhold or withdraw my consent to the use of telemedicine in the course of my care at any time, without affecting my right to future care or treatment, and that the Practitioner or I may terminate the telemedicine visit at any time. I understand that I have the right to inspect all information obtained and/or recorded in the course of the telemedicine visit and may receive copies of available information for a reasonable fee.  I understand that some of the potential risks of receiving the Services via telemedicine include:   Delay or interruption in medical evaluation due to technological equipment failure or disruption;  Information transmitted may not be sufficient (e.g. poor resolution of images) to allow for appropriate medical decision making by the Practitioner; and/or   In rare instances, security protocols could fail, causing a breach of personal health information.  Furthermore, I acknowledge that it is my responsibility to provide information about my medical history, conditions and care that is complete and accurate to the best of my ability. I acknowledge that  Practitioner's advice, recommendations, and/or decision may be based on factors not within their control, such as incomplete or inaccurate data provided by me or distortions of diagnostic images or specimens that may result from electronic transmissions. I understand that the practice of medicine is not an exact science and that Practitioner makes no warranties or guarantees regarding treatment outcomes. I acknowledge that I will receive a copy of this consent concurrently upon execution via email to the email address I last provided but may also request a printed copy by calling the office of [facility].    I understand that my insurance will be billed for this visit.   I have read or had this consent read to me.  I understand the contents of this consent, which adequately explains the benefits and risks of the Services being provided via telemedicine.   I have been provided ample opportunity to ask questions regarding this consent and the Services and have had my questions answered to my satisfaction.  I give my informed consent for the services to be provided through the use of telemedicine in my medical care  By participating in this telemedicine visit I agree to the above.

## 2019-01-16 ENCOUNTER — Encounter: Payer: Self-pay | Admitting: Cardiology

## 2019-01-16 ENCOUNTER — Telehealth (INDEPENDENT_AMBULATORY_CARE_PROVIDER_SITE_OTHER): Payer: Medicare Other | Admitting: Cardiology

## 2019-01-16 ENCOUNTER — Other Ambulatory Visit: Payer: Self-pay | Admitting: Endocrinology

## 2019-01-16 ENCOUNTER — Other Ambulatory Visit: Payer: Self-pay

## 2019-01-16 ENCOUNTER — Other Ambulatory Visit: Payer: Self-pay | Admitting: Cardiology

## 2019-01-16 VITALS — BP 146/89 | HR 60 | Ht 66.5 in | Wt 192.0 lb

## 2019-01-16 DIAGNOSIS — I10 Essential (primary) hypertension: Secondary | ICD-10-CM | POA: Diagnosis not present

## 2019-01-16 DIAGNOSIS — I2510 Atherosclerotic heart disease of native coronary artery without angina pectoris: Secondary | ICD-10-CM

## 2019-01-16 DIAGNOSIS — E7800 Pure hypercholesterolemia, unspecified: Secondary | ICD-10-CM

## 2019-01-16 MED ORDER — AMLODIPINE BESYLATE 5 MG PO TABS: 5.0000 mg | ORAL_TABLET | Freq: Every day | ORAL | 3 refills | Status: DC

## 2019-01-16 MED ORDER — OLMESARTAN MEDOXOMIL 40 MG PO TABS: 40.0000 mg | ORAL_TABLET | Freq: Every day | ORAL | 3 refills | Status: DC

## 2019-01-16 MED ORDER — EZETIMIBE-SIMVASTATIN 10-40 MG PO TABS: 1.0000 | ORAL_TABLET | Freq: Every day | ORAL | 3 refills | Status: DC

## 2019-01-16 NOTE — Patient Instructions (Signed)
Medication Instructions:  1) START AMLODIPINE 5 mg daily.  Please check your blood pressure daily (about 2 hours after you take your medications) for a week or two and call with results.  Your refills have been sent.   Labwork: Your provider recommends that you return for FASTING lab work in July.    Testing/Procedures: None  Follow-Up: Your provider wants you to follow-up in: 1 year with Dr. Radford Pax. You will receive a reminder letter in the mail two months in advance. If you don't receive a letter, please call our office to schedule the follow-up appointment.

## 2019-01-16 NOTE — Addendum Note (Signed)
Addended by: Harland German A on: 01/16/2019 02:31 PM   Modules accepted: Orders

## 2019-01-16 NOTE — Progress Notes (Signed)
Virtual Visit via Video Note   This visit type was conducted due to national recommendations for restrictions regarding the COVID-19 Pandemic (e.g. social distancing) in an effort to limit this patient's exposure and mitigate transmission in our community.  Due to her co-morbid illnesses, this patient is at least at moderate risk for complications without adequate follow up.  This format is felt to be most appropriate for this patient at this time.  All issues noted in this document were discussed and addressed.  A limited physical exam was performed with this format.  Please refer to the patient's chart for her consent to telehealth for [facility].  Evaluation Performed:  Follow-up visit  This visit type was conducted due to national recommendations for restrictions regarding the COVID-19 Pandemic (e.g. social distancing).  This format is felt to be most appropriate for this patient at this time.  All issues noted in this document were discussed and addressed.  No physical exam was performed (except for noted visual exam findings with Video Visits).  Please refer to the patient's chart (MyChart message for video visits and phone note for telephone visits) for the patient's consent to telehealth for [facility].  Date:  01/16/2019   ID:  Crystal Hobbs, DOB [birth year], MRN [id number]  Patient Location:  Home  Provider location:   [place]  PCP:  Carol Ada, MD  Cardiologist:  Fransico Him, MD Electrophysiologist:  None   Chief Complaint:  CAD, HTN  History of Present Illness:    Crystal Hobbs is a 68 y.o. female who presents via audio/video conferencing for a telehealth visit today.    Crystal Hobbs is a 68 y.o. 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  She is here today for followup and is doing well.  She denies any chest pain or pressure, SOB, DOE, PND, orthopnea, LE edema, dizziness, palpitations or syncope. She is compliant with her meds and  is tolerating meds with no SE.    The patient does not have symptoms concerning for COVID-19 infection (fever, chills, cough, or new shortness of breath).    Prior CV studies:   The following studies were reviewed today:  None  Past Medical History:  Diagnosis Date  . Arthritis   . Complication of anesthesia    takes along time to wake up  . Contact lens/glasses fitting    wears glasses or contacts  . Coronary artery disease 01/2007   s/p PCI of diagonal   . Diabetes mellitus without complication ([place])   . Dyslipidemia   . Hyperlipidemia   . Hypertension   . Hypothyroidism   . Snores   . Vertigo    Past Surgical History:  Procedure Laterality Date  . CARDIAC CATHETERIZATION  2008   stent  . SHOULDER ARTHROSCOPY WITH OPEN ROTATOR CUFF REPAIR Right 02/28/2013   Procedure: right shoulder arthroscopy, distal clavicle sculpting, open rotator cuff repair;  Surgeon: Cammie Sickle., MD;  Location: [place];  Service: Orthopedics;  Laterality: Right;     Current Meds  Medication Sig  . aspirin EC 81 MG tablet Take 324 mg by mouth daily.  . Biotin 5000 MCG CAPS Take 1 capsule by mouth daily.   Marland Kitchen ezetimibe-simvastatin (VYTORIN) 10-40 MG tablet TAKE 1 TABLET BY MOUTH EVERY DAY.  Marland Kitchen glucose blood (CONTOUR NEXT TEST) test strip 1 each by Other route as needed for other. Check blood sugar before meals, 2 hours  after meals, and at HS  . Insulin Disposable Pump (OMNIPOD DASH SYSTEM) KIT 1 each by Does not apply route. USE DAILY TO CONTINUOUSLY MONITOR BLOOD SUGAR.  Marland Kitchen insulin lispro (HUMALOG) 100 UNIT/ML injection Use 74 UNITS IN V-GO PUMP DAILY  . INVOKANA 300 MG TABS tablet TAKE 1 TABLET(300 MG) BY MOUTH DAILY BEFORE BREAKFAST  . levothyroxine (SYNTHROID) 112 MCG tablet TAKE 1 TABLET(112 MCG) BY MOUTH DAILY  . nitroGLYCERIN (NITROSTAT) 0.4 MG SL tablet Place 1 tablet (0.4 mg total) under the tongue every 5 (five) minutes as needed for chest pain.  Marland Kitchen olmesartan  (BENICAR) 40 MG tablet TAKE 1 TABLET BY MOUTH DAILY, please schedule an appt for further refill, 1st attempt  . omega-3 acid ethyl esters (LOVAZA) 1 G capsule Take 2 g by mouth 2 (two) times daily.  . SUPER B COMPLEX/C CAPS Take 1 tablet by mouth daily.   . Vitamin Mixture (VITAMIN E COMPLETE PO) Take 1 tablet by mouth daily.     Allergies:   Actos [pioglitazone]; Metformin and related; Novocain [procaine]; and Sulfa antibiotics   Social History   Tobacco Use  . Smoking status: Never Smoker  . Smokeless tobacco: Never Used  Substance Use Topics  . Alcohol use: Yes    Comment: occ  . Drug use: No     Family Hx: The patient's family history includes Heart attack in her father; Hypertension in her father.  ROS:   Please see the history of present illness.     All other systems reviewed and are negative.   Labs/Other Tests and Data Reviewed:    Recent Labs: 08/29/2018: ALT 18; TSH 2.12 12/05/2018: BUN 26; Creatinine, Ser 1.18; Potassium 4.9; Sodium 138   Recent Lipid Panel Lab Results  Component Value Date/Time   CHOL 162 05/26/2018 09:05 AM   CHOL 120 11/08/2017 08:51 AM   TRIG 178.0 (H) 05/26/2018 09:05 AM   HDL 37.70 (L) 05/26/2018 09:05 AM   HDL 45 11/08/2017 08:51 AM   CHOLHDL 4 05/26/2018 09:05 AM   LDLCALC 89 05/26/2018 09:05 AM   LDLCALC 55 11/08/2017 08:51 AM   LDLDIRECT 130.0 01/18/2018 08:38 AM    Wt Readings from Last 3 Encounters:  01/16/19 192 lb (87.1 kg)  09/05/18 196 lb 3.2 oz (89 kg)  08/16/18 196 lb 9.6 oz (89.2 kg)     Objective:    Vital Signs:  BP (!) 146/89 (BP Location: Left Arm, Patient Position: Sitting, Cuff Size: Normal)   Pulse 60   Ht 5' 6.5" (1.689 m)   Wt 192 lb (87.1 kg)   BMI 30.53 kg/m    CONSTITUTIONAL:  Well nourished, well developed female in no acute distress.  EYES: anicteric MOUTH: oral mucosa is pink RESPIRATORY: Normal respiratory effort, symmetric expansion CARDIOVASCULAR: No peripheral edema SKIN: No rash,  lesions or ulcers MUSCULOSKELETAL: no digital cyanosis NEURO: Cranial Nerves II-XII grossly intact, moves all extremities PSYCH: Intact judgement and insight.  A&O x 3, Mood/affect appropriate   ASSESSMENT & PLAN:    1.  ASCVD - she is status post bare-metal stent to the D1 in 2008.  Her last stress Myoview was in 2016 showed no ischemia.  She has been and has not had any anginal symptoms since I saw her last.  She will continue on aspirin 81 mg daily and Vytorin 10-40 mg daily.  2.  Hypertension -her blood pressure is borderline elevated on exam today.  She says it has been running high for a while now.  She will continue on Benicar 40 mg daily.  I am going to add amlodipine 74m daily and I have asked her to check her BP daily for a week and call with results.  Her creatinine was normal at 1.18 on 06/03/2018.  I will repeat and bmet in July.    3.  Hyperlipidemia -her LDL goal is less than 70.  She will continue on Vytorin 10-40 mg daily.  Her LDL was elevated to 89 last fall.  I will repeat an FLP and ALT in July.  4.  Type 2 diabetes mellitus -this is followed by her PCP and is controlled on insulin.  5.  COVID-19 Education:The signs and symptoms of COVID-19 were discussed with the patient and how to seek care for testing (follow up with PCP or arrange E-visit).  The importance of social distancing was discussed today.  Patient Risk:   After full review of this patient's clinical status, I feel that they are at least moderate risk at this time.  Time:   Today, I have spent 15 minutes directly with the patient on video discussing medical problems including CAD< HTN and lipids.  We also reviewed the symptoms of COVID 19 and the ways to protect against contracting the virus with telehealth technology.  I spent an additional 5 minutes reviewing patient's chart including labs and prior notes.  Medication Adjustments/Labs and Tests Ordered: Current medicines are reviewed at length with the  patient today.  Concerns regarding medicines are outlined above.  Tests Ordered: No orders of the defined types were placed in this encounter.  Medication Changes: No orders of the defined types were placed in this encounter.   Disposition:  Follow up in 1 year(s)  Signed, TFransico Him MD  01/16/2019 1:00 PM    [place]

## 2019-02-12 ENCOUNTER — Other Ambulatory Visit: Payer: Self-pay | Admitting: Endocrinology

## 2019-02-26 ENCOUNTER — Other Ambulatory Visit: Payer: Self-pay | Admitting: Endocrinology

## 2019-03-16 ENCOUNTER — Other Ambulatory Visit: Payer: Self-pay | Admitting: Endocrinology

## 2019-03-28 ENCOUNTER — Other Ambulatory Visit: Payer: Self-pay | Admitting: Endocrinology

## 2019-03-28 DIAGNOSIS — E063 Autoimmune thyroiditis: Secondary | ICD-10-CM

## 2019-03-28 DIAGNOSIS — Z794 Long term (current) use of insulin: Secondary | ICD-10-CM

## 2019-03-28 DIAGNOSIS — E1165 Type 2 diabetes mellitus with hyperglycemia: Secondary | ICD-10-CM

## 2019-04-02 ENCOUNTER — Other Ambulatory Visit: Payer: Self-pay | Admitting: Endocrinology

## 2019-04-03 ENCOUNTER — Other Ambulatory Visit (INDEPENDENT_AMBULATORY_CARE_PROVIDER_SITE_OTHER): Payer: Medicare Other

## 2019-04-03 ENCOUNTER — Other Ambulatory Visit: Payer: Self-pay

## 2019-04-03 DIAGNOSIS — E1165 Type 2 diabetes mellitus with hyperglycemia: Secondary | ICD-10-CM

## 2019-04-03 DIAGNOSIS — Z794 Long term (current) use of insulin: Secondary | ICD-10-CM | POA: Diagnosis not present

## 2019-04-03 DIAGNOSIS — E063 Autoimmune thyroiditis: Secondary | ICD-10-CM

## 2019-04-03 LAB — COMPREHENSIVE METABOLIC PANEL
ALT: 28 U/L (ref 0–35)
AST: 25 U/L (ref 0–37)
Albumin: 4.4 g/dL (ref 3.5–5.2)
Alkaline Phosphatase: 78 U/L (ref 39–117)
BUN: 23 mg/dL (ref 6–23)
CO2: 25 mEq/L (ref 19–32)
Calcium: 10.2 mg/dL (ref 8.4–10.5)
Chloride: 108 mEq/L (ref 96–112)
Creatinine, Ser: 1.12 mg/dL (ref 0.40–1.20)
GFR: 48.04 mL/min — ABNORMAL LOW (ref >60.00)
Glucose, Bld: 111 mg/dL — ABNORMAL HIGH (ref 70–99)
Potassium: 4.9 mEq/L (ref 3.5–5.1)
Sodium: 140 mEq/L (ref 135–145)
Total Bilirubin: 0.4 mg/dL (ref 0.2–1.2)
Total Protein: 7.3 g/dL (ref 6.0–8.3)

## 2019-04-03 LAB — T4, FREE: Free T4: 2.47 ng/dL — ABNORMAL HIGH (ref 0.60–1.60)

## 2019-04-03 LAB — TSH: TSH: 1.35 u[IU]/mL (ref 0.35–4.50)

## 2019-04-03 LAB — MICROALBUMIN / CREATININE URINE RATIO
Creatinine,U: 46.5 mg/dL
Microalb Creat Ratio: 23.9 mg/g (ref 0.0–30.0)
Microalb, Ur: 11.1 mg/dL — ABNORMAL HIGH (ref 0.0–1.9)

## 2019-04-03 LAB — HEMOGLOBIN A1C: Hgb A1c MFr Bld: 7.6 % — ABNORMAL HIGH (ref 4.6–6.5)

## 2019-04-05 NOTE — Progress Notes (Signed)
Patient ID: BLU MCGLAUN, female   DOB: [birth year], 68 y.o.   MRN: [id number]   Reason for Appointment: Diabetes follow-up    Today's office visit was provided via telemedicine using video technique  Consent for the patient has been obtained  Location of the patient: Home  Location of the provider: Office Only the patient and myself were participating in the encounter    History of Present Illness   Diagnosis: Type 2 DIABETES MELITUS, date of diagnosis:  2011   Past history: Her initial presentation with diabetes was with significant symptoms and glucose of 947 Initially was treated with insulin and subsequently on metformin which she could not tolerate  Since she was not responding to Victoza and had significant hyperglycemia she was started on basal bolus insulin regimen instead  However had required relatively large doses of insulin with inadequate control and blood sugars subsequently improved significantly with adding Victoza in 10/13  Her  A1c was 6.4 in 4/14 and she had overall good control with minimal hypoglycemia She was taken off  Afrezza because of her high out-of-pocket expense and she was tried on V-go pump which she had some difficulties getting this through the pharmacy  RECENT history:    Insulin regimen: OMNIPOD insulin pump started on 08/16/2018  BASAL rates 1.6 midnight-7 AM, 7 AM = 1.2, 10 AM = 1.1, 2 PM = 1.2 and 7 PM = 1.5, total basal 33 units  Carbohydrate coverage 1: 10.  Preset boluses 3-5 units at breakfast 4-5 at lunch and 6 units at dinner Correction 1: 50 with target 100-120  Non-insulin hypoglycemic drugs: Invokana 300 mg daily  Her A1c is 7.6 and consistently over 7 now  Current blood sugar patterns and problems identified:  She has continued using the One Touch meter and occasionally putting the readings in the OmniPod insulin pump  However most of her monitoring is in the morning  She now says that she will check her sugar  sometimes about an hour after eating and then do a bolus  She is rarely checking her blood sugar in the afternoon or evening  However most of her boluses are without entering her blood sugar at the same time  She thinks that her sugars are higher in the morning sometimes because of eating late  He is having irregular eating times and may sometimes skip either breakfast or lunch  Also not clear if she may be sometimes forgetting her boluses for breakfast and lunch  FASTING readings have been variable and mostly high  No hypoglycemia with exercise, does not suspend her pump for playing her tennis   Has been interested in the CGM before but has not pursued this  Physical activity: exercise: Mostly  play tennis 1-3/7 days a week            Dinnertime may be as late as 8-8:30 PM  Side effects from medications:  diarrhea from metformin,? Hair loss from Actos Proper timing of medications in relation to meals: Yes         Monitors blood glucose: As above.    Glucometer:  Contour Next/One Touch ultra .          Blood Glucose readings from pump download and meter review   PRE-MEAL Fasting Lunch Dinner Bedtime Overall  Glucose range:  114-192  168, 164    114-245  Mean/median:  145     158   POST-MEAL PC Breakfast PC Lunch PC Dinner  Glucose range:  245-187  Mean/median:      Previous readings:  PRE-MEAL Fasting Lunch Dinner Bedtime Overall  Glucose range:  110-157  95, 117  90-209   95-209  Mean/median:        POST-MEAL PC Breakfast PC Lunch PC Dinner  Glucose range:   198  176, 202  Mean/median:         Meals:  Usually low carbohydrate  Breakfast: may have half a peanut better sandwich or a granola bar.           Diabetes education visit: Most recent: 05/2012      CDE visit 12/19        Wt Readings from Last 3 Encounters:  04/07/19 194 lb 3.2 oz (88.1 kg)  01/16/19 192 lb (87.1 kg)  09/05/18 196 lb 3.2 oz (89 kg)    Lab Results  Component Value Date   HGBA1C  7.6 (H) 04/03/2019   HGBA1C 7.7 (H) 12/05/2018   HGBA1C 7.4 (H) 08/29/2018   Lab Results  Component Value Date   MICROALBUR 11.1 (H) 04/03/2019   LDLCALC 89 05/26/2018   CREATININE 1.12 04/03/2019    Other problems discussed today: See review of systems   Lab on 04/03/2019  Component Date Value Ref Range Status   Free T4 04/03/2019 2.47* 0.60 - 1.60 ng/dL Final   Comment: Specimens from patients who are undergoing biotin therapy and /or ingesting biotin supplements may contain high levels of biotin.  The higher biotin concentration in these specimens interferes with this Free T4 assay.  Specimens that contain high levels  of biotin may cause false high results for this Free T4 assay.  Please interpret results in light of the total clinical presentation of the patient.     TSH 04/03/2019 1.35  0.35 - 4.50 uIU/mL Final   Microalb, Ur 04/03/2019 11.1* 0.0 - 1.9 mg/dL Final   Creatinine,U 04/03/2019 46.5  mg/dL Final   Microalb Creat Ratio 04/03/2019 23.9  0.0 - 30.0 mg/g Final   Sodium 04/03/2019 140  135 - 145 mEq/L Final   Potassium 04/03/2019 4.9  3.5 - 5.1 mEq/L Final   Chloride 04/03/2019 108  96 - 112 mEq/L Final   CO2 04/03/2019 25  19 - 32 mEq/L Final   Glucose, Bld 04/03/2019 111* 70 - 99 mg/dL Final   BUN 04/03/2019 23  6 - 23 mg/dL Final   Creatinine, Ser 04/03/2019 1.12  0.40 - 1.20 mg/dL Final   Total Bilirubin 04/03/2019 0.4  0.2 - 1.2 mg/dL Final   Alkaline Phosphatase 04/03/2019 78  39 - 117 U/L Final   AST 04/03/2019 25  0 - 37 U/L Final   ALT 04/03/2019 28  0 - 35 U/L Final   Total Protein 04/03/2019 7.3  6.0 - 8.3 g/dL Final   Albumin 04/03/2019 4.4  3.5 - 5.2 g/dL Final   Calcium 04/03/2019 10.2  8.4 - 10.5 mg/dL Final   GFR 04/03/2019 48.04* >60.00 mL/min Final   Hgb A1c MFr Bld 04/03/2019 7.6* 4.6 - 6.5 % Final   Glycemic Control Guidelines for People with Diabetes:Non Diabetic:  <6%Goal of Therapy: <7%Additional Action Suggested:  >8%       Allergies as of 04/07/2019      Reactions   Actos [pioglitazone]    hairloss   Metformin And Related Diarrhea   Novocain [procaine]    syncopy   Sulfa Antibiotics Itching      Medication List       Accurate as of April 07, 2019  1:46 PM. If you have any questions, ask your nurse or doctor.        STOP taking these medications   VITAMIN E COMPLETE PO Stopped by: Elayne Snare, MD     TAKE these medications   amLODipine 5 MG tablet Commonly known as: NORVASC Take 1 tablet (5 mg total) by mouth daily.   aspirin EC 81 MG tablet Take 81 mg by mouth daily.   Biotin 5000 MCG Caps Take 1 capsule by mouth daily.   ezetimibe-simvastatin 10-40 MG tablet Commonly known as: VYTORIN Take 1 tablet by mouth daily.   glucose blood test strip Commonly known as: Contour Next Test 1 each by Other route as needed for other. Check blood sugar before meals, 2 hours after meals, and at HS   insulin lispro 100 UNIT/ML injection Commonly known as: HumaLOG INJECT 74 UNITS IN V-GO PUMP DAILY What changed: additional instructions   Invokana 300 MG Tabs tablet Generic drug: canagliflozin TAKE 1 TABLET(300 MG) BY MOUTH DAILY BEFORE BREAKFAST   levothyroxine 112 MCG tablet Commonly known as: SYNTHROID TAKE 1 TABLET(112 MCG) BY MOUTH DAILY   nitroGLYCERIN 0.4 MG SL tablet Commonly known as: NITROSTAT Place 1 tablet (0.4 mg total) under the tongue every 5 (five) minutes as needed for chest pain.   olmesartan 40 MG tablet Commonly known as: BENICAR Take 1 tablet (40 mg total) by mouth daily.   omega-3 acid ethyl esters 1 g capsule Commonly known as: LOVAZA Take 2 g by mouth 2 (two) times daily.   OmniPod Dash System Kit 1 each by Does not apply route. USE DAILY TO CONTINUOUSLY MONITOR BLOOD SUGAR.   Super B Complex/C Caps Take 1 tablet by mouth daily.       Allergies:  Allergies  Allergen Reactions   Actos [Pioglitazone]     hairloss   Metformin And Related Diarrhea    Novocain [Procaine]     syncopy   Sulfa Antibiotics Itching    Past Medical History:  Diagnosis Date   Arthritis    Complication of anesthesia    takes along time to wake up   Contact lens/glasses fitting    wears glasses or contacts   Coronary artery disease 01/2007   s/p PCI of diagonal    Diabetes mellitus without complication ([place])    Dyslipidemia    Hyperlipidemia    Hypertension    Hypothyroidism    Snores    Vertigo     Past Surgical History:  Procedure Laterality Date   CARDIAC CATHETERIZATION  2008   stent   SHOULDER ARTHROSCOPY WITH OPEN ROTATOR CUFF REPAIR Right 02/28/2013   Procedure: right shoulder arthroscopy, distal clavicle sculpting, open rotator cuff repair;  Surgeon: Cammie Sickle., MD;  Location: [place];  Service: Orthopedics;  Laterality: Right;    Family History  Problem Relation Age of Onset   Hypertension Father    Heart attack Father     Social History:  reports that she has never smoked. She has never used smokeless tobacco. She reports current alcohol use. She reports that she does not use drugs.  Review of Systems:  HYPERTENSION:  followed by PCP/cardiologist, blood pressure relatively better today  Is on Benicar 40 mg  Last urine microalbumin is normal, previously had been high  BP Readings from Last 3 Encounters:  04/07/19 140/70  01/16/19 (!) 146/89  09/05/18 134/80    HYPERLIPIDEMIA: The lipid abnormality consists of elevated LDL, taking Vytorin, followed  by cardiologist.   She was tried on Crestor but she thinks this caused leg pain  Has minimal increase in triglycerides and low HDL Last LDL was below 100  Lab Results  Component Value Date   CHOL 162 05/26/2018   HDL 37.70 (L) 05/26/2018   LDLCALC 89 05/26/2018   LDLDIRECT 130.0 01/18/2018   TRIG 178.0 (H) 05/26/2018   CHOLHDL 4 05/26/2018    History of mild hypothyroidism, treated for several years Her TSH was high in 5/19  and subsequently dosage was increased  She is taking 112 mcg levothyroxine and again TSH was normal She feels fairly good overall  Lab Results  Component Value Date   TSH 1.35 04/03/2019   TSH 2.12 08/29/2018   TSH 6.55 (H) 05/26/2018        Examination:   BP 140/70 (BP Location: Left Arm, Patient Position: Sitting, Cuff Size: Large)    Pulse 69    Ht 5' 6.5" (1.689 m)    Wt 194 lb 3.2 oz (88.1 kg)    SpO2 98%    BMI 30.88 kg/m   Body mass index is 30.88 kg/m.     ASSESSMENT/ PLAN:   Diabetes type 2 With BMI 31  See history of present illness for  discussion of current management, blood sugar patterns and problems identified  Her A1c is still high at 7.6  Without adequate monitoring and proper bolusing her blood sugars are not well controlled  Discussed in detail the need for bolusing before starting to eat especially in the evenings instead of doing it postprandially at times especially in the evenings Also needs to enter her blood sugars every time she is eating to enable correction doses Also try not skip any boluses Discussed using the freestyle libre system and how this would be beneficial and given her the information on where to order this She will also need to adjust her boluses by entering proper amount of carbohydrates based on her meal size and carbohydrate intake Continue to reduce total calories to enable weight loss  Renal function: Creatinine is normal, currently taking both olmesartan and Invokana Microalbuminuria appears improved now  Lipids need to be checked, will defer to cardiologist  Thyroid level is stable with current dose of 112 mcg  Hypertension: Blood pressure is stable, followed by cardiologist  Counseling time on subjects discussed in assessment and plan sections is over 50% of today's 25 minute visit    There are no Patient Instructions on file for this visit.     Elayne Snare 04/07/2019, 1:46 PM

## 2019-04-07 ENCOUNTER — Encounter: Payer: Self-pay | Admitting: Endocrinology

## 2019-04-07 ENCOUNTER — Other Ambulatory Visit: Payer: Self-pay

## 2019-04-07 ENCOUNTER — Ambulatory Visit: Payer: Medicare Other | Admitting: Endocrinology

## 2019-04-07 VITALS — BP 140/70 | HR 69 | Ht 66.5 in | Wt 194.2 lb

## 2019-04-07 DIAGNOSIS — E063 Autoimmune thyroiditis: Secondary | ICD-10-CM

## 2019-04-07 DIAGNOSIS — E1165 Type 2 diabetes mellitus with hyperglycemia: Secondary | ICD-10-CM | POA: Diagnosis not present

## 2019-04-07 DIAGNOSIS — E1129 Type 2 diabetes mellitus with other diabetic kidney complication: Secondary | ICD-10-CM

## 2019-04-07 DIAGNOSIS — Z794 Long term (current) use of insulin: Secondary | ICD-10-CM

## 2019-04-07 DIAGNOSIS — R809 Proteinuria, unspecified: Secondary | ICD-10-CM

## 2019-04-17 ENCOUNTER — Other Ambulatory Visit: Payer: Self-pay | Admitting: Endocrinology

## 2019-04-28 ENCOUNTER — Telehealth: Payer: Self-pay

## 2019-04-28 NOTE — Telephone Encounter (Signed)
Pt left message with after hours answering service yesterday and stated that she was having trouble with her medical device for her insulin. Attempted to call pt and get more information about this, but pt did not answer. Left voicemail requesting a callback.

## 2019-05-01 NOTE — Telephone Encounter (Signed)
Pt returned call and she wanted to see the diabetic educator in order to switch over to her new Omnipod PDM that [facility] sent her.

## 2019-05-03 ENCOUNTER — Encounter: Payer: Medicare Other | Admitting: Nutrition

## 2019-05-08 ENCOUNTER — Encounter: Payer: Medicare Other | Admitting: Nutrition

## 2019-05-08 ENCOUNTER — Other Ambulatory Visit: Payer: Self-pay

## 2019-05-09 ENCOUNTER — Encounter: Payer: Medicare Other | Attending: Family Medicine | Admitting: Nutrition

## 2019-05-09 ENCOUNTER — Other Ambulatory Visit: Payer: Self-pay

## 2019-05-09 DIAGNOSIS — E1165 Type 2 diabetes mellitus with hyperglycemia: Secondary | ICD-10-CM

## 2019-05-09 NOTE — Progress Notes (Addendum)
Her new dash was set up with the settings from her old [place].  She had put in some of the settings correctly.  Basal rate:  MN: 1.6, 7AM: 1.2, 10AM: 1.1, 2PM: 1.2, 7PM: 1.5  I/C 10, ISF: 50, target: 100 with correction over 120.  Timing 4 hours.  Pt. Reports potting 50 carbs for breakfast and lunch and 60 carbs for supper.  We reviewed again how to bolus, making sure that she puts the blood sugar reading in, and we reviwed the need to increase her carbs by 10 or 20 when eating a larger meal, and to reduce by 10 or 20 if meals are smaller in size.  She had no final questions.

## 2019-05-11 LAB — HM DIABETES EYE EXAM

## 2019-05-18 ENCOUNTER — Telehealth: Payer: Self-pay | Admitting: Endocrinology

## 2019-05-18 NOTE — Telephone Encounter (Signed)
Noted  

## 2019-05-18 NOTE — Telephone Encounter (Signed)
Pt calling to let us know that [employer] is in network for her insurance for the [employer]

## 2019-05-22 ENCOUNTER — Other Ambulatory Visit: Payer: Self-pay | Admitting: Endocrinology

## 2019-06-19 ENCOUNTER — Other Ambulatory Visit: Payer: Self-pay | Admitting: Endocrinology

## 2019-07-01 ENCOUNTER — Other Ambulatory Visit: Payer: Self-pay | Admitting: Endocrinology

## 2019-07-06 ENCOUNTER — Other Ambulatory Visit (INDEPENDENT_AMBULATORY_CARE_PROVIDER_SITE_OTHER): Payer: Medicare Other

## 2019-07-06 ENCOUNTER — Other Ambulatory Visit: Payer: Self-pay

## 2019-07-06 DIAGNOSIS — Z794 Long term (current) use of insulin: Secondary | ICD-10-CM | POA: Diagnosis not present

## 2019-07-06 DIAGNOSIS — E1165 Type 2 diabetes mellitus with hyperglycemia: Secondary | ICD-10-CM | POA: Diagnosis not present

## 2019-07-06 LAB — COMPREHENSIVE METABOLIC PANEL
ALT: 20 U/L (ref 0–35)
AST: 20 U/L (ref 0–37)
Albumin: 4.3 g/dL (ref 3.5–5.2)
Alkaline Phosphatase: 97 U/L (ref 39–117)
BUN: 27 mg/dL — ABNORMAL HIGH (ref 6–23)
CO2: 26 mEq/L (ref 19–32)
Calcium: 9.9 mg/dL (ref 8.4–10.5)
Chloride: 107 mEq/L (ref 96–112)
Creatinine, Ser: 1.27 mg/dL — ABNORMAL HIGH (ref 0.40–1.20)
GFR: 41.52 mL/min — ABNORMAL LOW (ref >60.00)
Glucose, Bld: 155 mg/dL — ABNORMAL HIGH (ref 70–99)
Potassium: 4.6 mEq/L (ref 3.5–5.1)
Sodium: 139 mEq/L (ref 135–145)
Total Bilirubin: 0.5 mg/dL (ref 0.2–1.2)
Total Protein: 7.3 g/dL (ref 6.0–8.3)

## 2019-07-06 LAB — HEMOGLOBIN A1C: Hgb A1c MFr Bld: 7.4 % — ABNORMAL HIGH (ref 4.6–6.5)

## 2019-07-09 NOTE — Progress Notes (Deleted)
Patient ID: Crystal Hobbs, female   DOB: 02/17/1951, 68 y.o.   MRN: [id number]   Reason for Appointment: Diabetes follow-up    Today's office visit was provided via telemedicine using video technique . Consent for the patient has been obtained . Location of the patient: Home . Location of the provider: Office Only the patient and myself were participating in the encounter    History of Present Illness   Diagnosis: Type 2 DIABETES MELITUS, date of diagnosis:  2011   Past history: Her initial presentation with diabetes was with significant symptoms and glucose of 947 Initially was treated with insulin and subsequently on metformin which she could not tolerate  Since she was not responding to Victoza and had significant hyperglycemia she was started on basal bolus insulin regimen instead  However had required relatively large doses of insulin with inadequate control and blood sugars subsequently improved significantly with adding Victoza in 10/13  Her  A1c was 6.4 in 4/14 and she had overall good control with minimal hypoglycemia She was taken off  Afrezza because of her high out-of-pocket expense and she was tried on V-go pump which she had some difficulties getting this through the pharmacy  RECENT history:    Insulin regimen: OMNIPOD insulin pump started on 08/16/2018  BASAL rates 1.6 midnight-7 AM, 7 AM = 1.2, 10 AM = 1.1, 2 PM = 1.2 and 7 PM = 1.5, total basal 33 units  Carbohydrate coverage 1: 10.  Preset boluses 3-5 units at breakfast 4-5 at lunch and 6 units at dinner Correction 1: 50 with target 100-120  Non-insulin hypoglycemic drugs: Invokana 300 mg daily  Her A1c is 7.6 and consistently over 7 now  Current blood sugar patterns and problems identified:  She has continued using the One Touch meter and occasionally putting the readings in the OmniPod insulin pump  However most of her monitoring is in the morning  She now says that she will check her sugar  sometimes about an hour after eating and then do a bolus  She is rarely checking her blood sugar in the afternoon or evening  However most of her boluses are without entering her blood sugar at the same time  She thinks that her sugars are higher in the morning sometimes because of eating late  He is having irregular eating times and may sometimes skip either breakfast or lunch  Also not clear if she may be sometimes forgetting her boluses for breakfast and lunch  FASTING readings have been variable and mostly high  No hypoglycemia with exercise, does not suspend her pump for playing her tennis   Has been interested in the CGM before but has not pursued this  Physical activity: exercise: Mostly  play tennis 1-3/7 days a week            Dinnertime may be as late as 8-8:30 PM  Side effects from medications:  diarrhea from metformin,? Hair loss from Actos Proper timing of medications in relation to meals: Yes         Monitors blood glucose: As above.    Glucometer:  Contour Next/One Touch ultra .          Blood Glucose readings from pump download and meter review   PRE-MEAL Fasting Lunch Dinner Bedtime Overall  Glucose range:  114-192  168, 164    114-245  Mean/median:  145     158   POST-MEAL PC Breakfast PC Lunch PC Dinner  Glucose range:  245-187  Mean/median:      Previous readings:  PRE-MEAL Fasting Lunch Dinner Bedtime Overall  Glucose range:  110-157  95, 117  90-209   95-209  Mean/median:        POST-MEAL PC Breakfast PC Lunch PC Dinner  Glucose range:   198  176, 202  Mean/median:         Meals:  Usually low carbohydrate  Breakfast: may have half a peanut better sandwich or a granola bar.           Diabetes education visit: Most recent: 05/2012      CDE visit 12/19        Wt Readings from Last 3 Encounters:  04/07/19 194 lb 3.2 oz (88.1 kg)  01/16/19 192 lb (87.1 kg)  09/05/18 196 lb 3.2 oz (89 kg)    Lab Results  Component Value Date   HGBA1C  7.4 (H) 07/06/2019   HGBA1C 7.6 (H) 04/03/2019   HGBA1C 7.7 (H) 12/05/2018   Lab Results  Component Value Date   MICROALBUR 11.1 (H) 04/03/2019   LDLCALC 89 05/26/2018   CREATININE 1.27 (H) 07/06/2019    Other problems discussed today: See review of systems   Lab on 07/06/2019  Component Date Value Ref Range Status  . Sodium 07/06/2019 139  135 - 145 mEq/L Final  . Potassium 07/06/2019 4.6  3.5 - 5.1 mEq/L Final  . Chloride 07/06/2019 107  96 - 112 mEq/L Final  . CO2 07/06/2019 26  19 - 32 mEq/L Final  . Glucose, Bld 07/06/2019 155* 70 - 99 mg/dL Final  . BUN 07/06/2019 27* 6 - 23 mg/dL Final  . Creatinine, Ser 07/06/2019 1.27* 0.40 - 1.20 mg/dL Final  . Total Bilirubin 07/06/2019 0.5  0.2 - 1.2 mg/dL Final  . Alkaline Phosphatase 07/06/2019 97  39 - 117 U/L Final  . AST 07/06/2019 20  0 - 37 U/L Final  . ALT 07/06/2019 20  0 - 35 U/L Final  . Total Protein 07/06/2019 7.3  6.0 - 8.3 g/dL Final  . Albumin 07/06/2019 4.3  3.5 - 5.2 g/dL Final  . Calcium 07/06/2019 9.9  8.4 - 10.5 mg/dL Final  . GFR 07/06/2019 41.52* >60.00 mL/min Final  . Hgb A1c MFr Bld 07/06/2019 7.4* 4.6 - 6.5 % Final   Glycemic Control Guidelines for People with Diabetes:Non Diabetic:  <6%Goal of Therapy: <7%Additional Action Suggested:  >8%     Allergies as of 07/10/2019      Reactions   Actos [pioglitazone]    hairloss   Metformin And Related Diarrhea   Novocain [procaine]    syncopy   Sulfa Antibiotics Itching      Medication List       Accurate as of July 09, 2019  9:08 PM. If you have any questions, ask your nurse or doctor.        amLODipine 5 MG tablet Commonly known as: NORVASC Take 1 tablet (5 mg total) by mouth daily.   aspirin EC 81 MG tablet Take 81 mg by mouth daily.   Biotin 5000 MCG Caps Take 1 capsule by mouth daily.   ezetimibe-simvastatin 10-40 MG tablet Commonly known as: VYTORIN Take 1 tablet by mouth daily.   glucose blood test strip Commonly known as:  Contour Next Test 1 each by Other route as needed for other. Check blood sugar before meals, 2 hours after meals, and at HS   insulin lispro 100 UNIT/ML injection Commonly known as: HumaLOG INJECT 74 UNITS  IN OMNIPOD PUMP DAILY   Invokana 300 MG Tabs tablet Generic drug: canagliflozin TAKE 1 TABLET(300 MG) BY MOUTH DAILY BEFORE BREAKFAST   levothyroxine 112 MCG tablet Commonly known as: SYNTHROID TAKE 1 TABLET(112 MCG) BY MOUTH DAILY   nitroGLYCERIN 0.4 MG SL tablet Commonly known as: NITROSTAT Place 1 tablet (0.4 mg total) under the tongue every 5 (five) minutes as needed for chest pain.   olmesartan 40 MG tablet Commonly known as: BENICAR Take 1 tablet (40 mg total) by mouth daily.   omega-3 acid ethyl esters 1 g capsule Commonly known as: LOVAZA Take 2 g by mouth 2 (two) times daily.   OmniPod Dash System Kit 1 each by Does not apply route. USE DAILY TO CONTINUOUSLY MONITOR BLOOD SUGAR.   Super B Complex/C Caps Take 1 tablet by mouth daily.       Allergies:  Allergies  Allergen Reactions  . Actos [Pioglitazone]     hairloss  . Metformin And Related Diarrhea  . Novocain [Procaine]     syncopy  . Sulfa Antibiotics Itching    Past Medical History:  Diagnosis Date  . Arthritis   . Complication of anesthesia    takes along time to wake up  . Contact lens/glasses fitting    wears glasses or contacts  . Coronary artery disease 01/2007   s/p PCI of diagonal   . Diabetes mellitus without complication ([place])   . Dyslipidemia   . Hyperlipidemia   . Hypertension   . Hypothyroidism   . Snores   . Vertigo     Past Surgical History:  Procedure Laterality Date  . CARDIAC CATHETERIZATION  2008   stent  . SHOULDER ARTHROSCOPY WITH OPEN ROTATOR CUFF REPAIR Right 02/28/2013   Procedure: right shoulder arthroscopy, distal clavicle sculpting, open rotator cuff repair;  Surgeon: Cammie Sickle., MD;  Location: [place];  Service: Orthopedics;   Laterality: Right;    Family History  Problem Relation Age of Onset  . Hypertension Father   . Heart attack Father     Social History:  reports that she has never smoked. She has never used smokeless tobacco. She reports current alcohol use. She reports that she does not use drugs.  Review of Systems:  HYPERTENSION:  followed by PCP/cardiologist, blood pressure relatively better today  Is on Benicar 40 mg  Last urine microalbumin is normal, previously had been high  BP Readings from Last 3 Encounters:  04/07/19 140/70  01/16/19 (!) 146/89  09/05/18 134/80    HYPERLIPIDEMIA: The lipid abnormality consists of elevated LDL, taking Vytorin, followed by cardiologist.   She was tried on Crestor but she thinks this caused leg pain  Has minimal increase in triglycerides and low HDL Last LDL was below 100  Lab Results  Component Value Date   CHOL 162 05/26/2018   HDL 37.70 (L) 05/26/2018   LDLCALC 89 05/26/2018   LDLDIRECT 130.0 01/18/2018   TRIG 178.0 (H) 05/26/2018   CHOLHDL 4 05/26/2018    History of mild hypothyroidism, treated for several years Her TSH was high in 5/19 and subsequently dosage was increased  She is taking 112 mcg levothyroxine and again TSH was normal She feels fairly good overall  Lab Results  Component Value Date   TSH 1.35 04/03/2019   TSH 2.12 08/29/2018   TSH 6.55 (H) 05/26/2018        Examination:   There were no vitals taken for this visit.  There is no height or  weight on file to calculate BMI.     ASSESSMENT/ PLAN:   Diabetes type 2 With BMI 31  See history of present illness for  discussion of current management, blood sugar patterns and problems identified  Her A1c is still high at 7.6  Without adequate monitoring and proper bolusing her blood sugars are not well controlled  Discussed in detail the need for bolusing before starting to eat especially in the evenings instead of doing it postprandially at times especially in  the evenings Also needs to enter her blood sugars every time she is eating to enable correction doses Also try not skip any boluses Discussed using the freestyle libre system and how this would be beneficial and given her the information on where to order this She will also need to adjust her boluses by entering proper amount of carbohydrates based on her meal size and carbohydrate intake Continue to reduce total calories to enable weight loss  Renal function: Creatinine is normal, currently taking both olmesartan and Invokana Microalbuminuria appears improved now  Lipids need to be checked, will defer to cardiologist  Thyroid level is stable with current dose of 112 mcg  Hypertension: Blood pressure is stable, followed by cardiologist  Counseling time on subjects discussed in assessment and plan sections is over 50% of today's 25 minute visit    There are no Patient Instructions on file for this visit.     Elayne Snare 07/09/2019, 9:08 PM

## 2019-07-10 ENCOUNTER — Ambulatory Visit: Payer: Medicare Other | Admitting: Endocrinology

## 2019-07-14 ENCOUNTER — Other Ambulatory Visit: Payer: Self-pay

## 2019-07-14 ENCOUNTER — Ambulatory Visit: Payer: Medicare Other | Admitting: Endocrinology

## 2019-07-19 ENCOUNTER — Other Ambulatory Visit: Payer: Self-pay | Admitting: Endocrinology

## 2019-07-24 ENCOUNTER — Other Ambulatory Visit: Payer: Self-pay

## 2019-07-24 MED ORDER — INSULIN LISPRO 100 UNIT/ML ~~LOC~~ SOLN: SUBCUTANEOUS | 2 refills | Status: DC

## 2019-07-25 ENCOUNTER — Telehealth: Payer: Self-pay

## 2019-07-25 NOTE — Telephone Encounter (Signed)
pstiernt called in about her amdpoid( not sure on spelling) would like if someone will call her and let know what she needs to do to get this situated    Please call and advise

## 2019-07-28 ENCOUNTER — Other Ambulatory Visit: Payer: Self-pay

## 2019-07-28 ENCOUNTER — Other Ambulatory Visit: Payer: Self-pay | Admitting: Endocrinology

## 2019-07-28 MED ORDER — INSULIN GLARGINE 100 UNIT/ML ~~LOC~~ SOLN: 34.0000 [IU] | Freq: Every day | SUBCUTANEOUS | 0 refills | Status: DC

## 2019-07-28 MED ORDER — OMNIPOD DASH PODS (GEN 4) MISC: 1.0000 | 1 refills | Status: DC

## 2019-07-28 NOTE — Telephone Encounter (Signed)
Pt is out of her omnipods and currently waiting on a new shipment from her mail order pharmacy.  Can you please advise on insulin doses until she gets her pods?

## 2019-07-28 NOTE — Telephone Encounter (Signed)
Called pt and left detailed voicemail with MD message. Pt urged to call back if she has any questions.

## 2019-07-28 NOTE — Telephone Encounter (Signed)
Prescription has been sent for Lantus insulin with a vial, she will draw up inject 34 units at bedtime until she gets the pump.  Also take Humalog before each meal similar to what she was doing with the pump.  Please let her know.  If Crystal Hobbs is not in the office somebody else can call her

## 2019-08-09 ENCOUNTER — Other Ambulatory Visit: Payer: Self-pay

## 2019-08-14 ENCOUNTER — Other Ambulatory Visit: Payer: Self-pay

## 2019-08-14 ENCOUNTER — Encounter: Payer: Self-pay | Admitting: Endocrinology

## 2019-08-14 ENCOUNTER — Ambulatory Visit (INDEPENDENT_AMBULATORY_CARE_PROVIDER_SITE_OTHER): Payer: Medicare Other | Admitting: Endocrinology

## 2019-08-14 VITALS — BP 142/72 | HR 69 | Ht 66.5 in | Wt 196.4 lb

## 2019-08-14 DIAGNOSIS — Z794 Long term (current) use of insulin: Secondary | ICD-10-CM

## 2019-08-14 DIAGNOSIS — E063 Autoimmune thyroiditis: Secondary | ICD-10-CM

## 2019-08-14 DIAGNOSIS — E1165 Type 2 diabetes mellitus with hyperglycemia: Secondary | ICD-10-CM

## 2019-08-14 DIAGNOSIS — E782 Mixed hyperlipidemia: Secondary | ICD-10-CM | POA: Diagnosis not present

## 2019-08-14 MED ORDER — INSULIN ASPART 100 UNIT/ML ~~LOC~~ SOLN: 74.0000 [IU] | Freq: Every day | SUBCUTANEOUS | 1 refills | Status: DC

## 2019-08-14 MED ORDER — CONTOUR NEXT TEST VI STRP: ORAL_STRIP | 3 refills | Status: DC

## 2019-08-14 NOTE — Progress Notes (Signed)
Patient ID: Crystal Hobbs, female   DOB: September 10, 1951, 68 y.o.   MRN: [id number]   Reason for Appointment: Endocrinology follow-up    History of Present Illness   Diagnosis: Type 2 DIABETES MELITUS, date of diagnosis:  2011   Past history: Her initial presentation with diabetes was with significant symptoms and glucose of 947 Initially was treated with insulin and subsequently on metformin which she could not tolerate  Since she was not responding to Victoza and had significant hyperglycemia she was started on basal bolus insulin regimen instead  However had required relatively large doses of insulin with inadequate control and blood sugars subsequently improved significantly with adding Victoza in 10/13  Her  A1c was 6.4 in 4/14 and she had overall good control with minimal hypoglycemia She was taken off  Afrezza because of her high out-of-pocket expense and she was tried on V-go pump which she had some difficulties getting this through the pharmacy  RECENT history:    Insulin regimen: OMNIPOD insulin pump started on 08/16/2018  BASAL rates 1.6 midnight-7 AM, 7 AM = 1.2, 10 AM = 1.1, 2 PM = 1.2 and 7 PM = 1.5, total basal 33 units  Carbohydrate coverage 1: 10.  Preset boluses 3-5 units at breakfast 4-5 at lunch and 6 units at dinner Correction 1: 50 with target 100-120  Non-insulin hypoglycemic drugs: Invokana 300 mg daily  Her A1c is 7.4 and consistently over 7   Current pump management, blood sugar patterns and problems identified:  She has continued using the One Touch meter since she cannot get approval from her insurance for Contour test strips  As before she is forgetting to check readings after meals  However most of her readings after dinner/at bedtime are high except once  She thinks morning sugars may be higher if she is having more carbohydrate like popcorn late in the evening  She is usually trying to bolus before she starts eating  However she is not  entering the blood sugar at the time of her bolus except at breakfast  Overall she has been somewhat less active although is trying to do more walking now  She is adjusting her boluses somewhat based on her portions and carbohydrates  FASTING readings have been variable but not as high as the last time  Her weight is gradually going up   Physical activity: exercise: play tennis 1-3/7 days a week            Dinnertime may be as late as 8-8:30 PM  Side effects from medications:  diarrhea from metformin ? Hair loss from Actos Proper timing of medications in relation to meals: Yes         Monitors blood glucose: As above.    Glucometer:  Contour Next/One Touch ultra .          Blood Glucose readings from pump download and meter review   PRE-MEAL Fasting Lunch Dinner Bedtime Overall  Glucose range:  117-160  107, 207   155-201   Mean/median:  135     144   Previous readings:  PRE-MEAL Fasting Lunch Dinner Bedtime Overall  Glucose range:  114-192  168, 164    114-245  Mean/median:  145     158   POST-MEAL PC Breakfast PC Lunch PC Dinner  Glucose range:    245-187  Mean/median:         Meals:  Usually low carbohydrate  Breakfast: may have half a peanut better sandwich  or a granola bar.           Diabetes education visit: Most recent: 05/2012      CDE visit 12/19        Wt Readings from Last 3 Encounters:  08/14/19 196 lb 6.4 oz (89.1 kg)  04/07/19 194 lb 3.2 oz (88.1 kg)  01/16/19 192 lb (87.1 kg)    Lab Results  Component Value Date   HGBA1C 7.4 (H) 07/06/2019   HGBA1C 7.6 (H) 04/03/2019   HGBA1C 7.7 (H) 12/05/2018   Lab Results  Component Value Date   MICROALBUR 11.1 (H) 04/03/2019   LDLCALC 89 05/26/2018   CREATININE 1.27 (H) 07/06/2019    Other problems discussed today: See review of systems   No visits with results within 1 Week(s) from this visit.  Latest known visit with results is:  Lab on 07/06/2019  Component Date Value Ref Range Status  .  Sodium 07/06/2019 139  135 - 145 mEq/L Final  . Potassium 07/06/2019 4.6  3.5 - 5.1 mEq/L Final  . Chloride 07/06/2019 107  96 - 112 mEq/L Final  . CO2 07/06/2019 26  19 - 32 mEq/L Final  . Glucose, Bld 07/06/2019 155* 70 - 99 mg/dL Final  . BUN 07/06/2019 27* 6 - 23 mg/dL Final  . Creatinine, Ser 07/06/2019 1.27* 0.40 - 1.20 mg/dL Final  . Total Bilirubin 07/06/2019 0.5  0.2 - 1.2 mg/dL Final  . Alkaline Phosphatase 07/06/2019 97  39 - 117 U/L Final  . AST 07/06/2019 20  0 - 37 U/L Final  . ALT 07/06/2019 20  0 - 35 U/L Final  . Total Protein 07/06/2019 7.3  6.0 - 8.3 g/dL Final  . Albumin 07/06/2019 4.3  3.5 - 5.2 g/dL Final  . Calcium 07/06/2019 9.9  8.4 - 10.5 mg/dL Final  . GFR 07/06/2019 41.52* >60.00 mL/min Final  . Hgb A1c MFr Bld 07/06/2019 7.4* 4.6 - 6.5 % Final   Glycemic Control Guidelines for People with Diabetes:Non Diabetic:  <6%Goal of Therapy: <7%Additional Action Suggested:  >8%     Allergies as of 08/14/2019      Reactions   Actos [pioglitazone]    hairloss   Metformin And Related Diarrhea   Novocain [procaine]    syncopy   Sulfa Antibiotics Itching      Medication List       Accurate as of August 14, 2019  9:07 PM. If you have any questions, ask your nurse or doctor.        STOP taking these medications   insulin glargine 100 UNIT/ML injection Commonly known as: Lantus Stopped by: Elayne Snare, MD   insulin lispro 100 UNIT/ML injection Commonly known as: HumaLOG Stopped by: Jayme Cloud, LPN     TAKE these medications   amLODipine 5 MG tablet Commonly known as: NORVASC Take 1 tablet (5 mg total) by mouth daily.   aspirin EC 81 MG tablet Take 81 mg by mouth daily.   Biotin 5000 MCG Caps Take 1 capsule by mouth daily.   Contour Next Test test strip Generic drug: glucose blood Use Bayer Contour test strips to check blood sugar 7 times daily. What changed:   how much to take  how to take this  when to take this  reasons to take this   additional instructions Changed by: Jayme Cloud, LPN   ezetimibe-simvastatin 10-40 MG tablet Commonly known as: VYTORIN Take 1 tablet by mouth daily.   insulin aspart 100 UNIT/ML injection  Commonly known as: NovoLOG Inject 74 Units into the skin daily. Use max of 74 units daily via omnipod insulin pump.   Invokana 300 MG Tabs tablet Generic drug: canagliflozin TAKE 1 TABLET(300 MG) BY MOUTH DAILY BEFORE BREAKFAST   levothyroxine 112 MCG tablet Commonly known as: SYNTHROID TAKE 1 TABLET(112 MCG) BY MOUTH DAILY   nitroGLYCERIN 0.4 MG SL tablet Commonly known as: NITROSTAT Place 1 tablet (0.4 mg total) under the tongue every 5 (five) minutes as needed for chest pain.   olmesartan 40 MG tablet Commonly known as: BENICAR Take 1 tablet (40 mg total) by mouth daily.   omega-3 acid ethyl esters 1 g capsule Commonly known as: LOVAZA Take 2 g by mouth 2 (two) times daily.   OmniPod Dash System Kit 1 each by Does not apply route. USE DAILY TO CONTINUOUSLY MONITOR BLOOD SUGAR.   OmniPod Dash 5 Pack Pods Misc 1 each by Does not apply route every 3 (three) days. Apply 1 pod to body once every 3 days for insulin delivery.   Super B Complex/C Caps Take 1 tablet by mouth daily.       Allergies:  Allergies  Allergen Reactions  . Actos [Pioglitazone]     hairloss  . Metformin And Related Diarrhea  . Novocain [Procaine]     syncopy  . Sulfa Antibiotics Itching    Past Medical History:  Diagnosis Date  . Arthritis   . Complication of anesthesia    takes along time to wake up  . Contact lens/glasses fitting    wears glasses or contacts  . Coronary artery disease 01/2007   s/p PCI of diagonal   . Diabetes mellitus without complication ([place])   . Dyslipidemia   . Hyperlipidemia   . Hypertension   . Hypothyroidism   . Snores   . Vertigo     Past Surgical History:  Procedure Laterality Date  . CARDIAC CATHETERIZATION  2008   stent  . SHOULDER ARTHROSCOPY WITH OPEN  ROTATOR CUFF REPAIR Right 02/28/2013   Procedure: right shoulder arthroscopy, distal clavicle sculpting, open rotator cuff repair;  Surgeon: Cammie Sickle., MD;  Location: [place];  Service: Orthopedics;  Laterality: Right;    Family History  Problem Relation Age of Onset  . Hypertension Father   . Heart attack Father     Social History:  reports that she has never smoked. She has never used smokeless tobacco. She reports current alcohol use. She reports that she does not use drugs.  Review of Systems:  HYPERTENSION:  followed by PCP/cardiologist, Home am  147/72-80  Is on Benicar 40 mg  Last urine microalbumin is normal, previously had been high  BP Readings from Last 3 Encounters:  08/14/19 (!) 142/72  04/07/19 140/70  01/16/19 (!) 146/89   Lab Results  Component Value Date   CREATININE 1.27 (H) 07/06/2019   CREATININE 1.12 04/03/2019   CREATININE 1.18 12/05/2018     HYPERLIPIDEMIA: The lipid abnormality consists of elevated LDL, taking Vytorin, followed by cardiologist.   She was tried on Crestor but she thinks this caused leg pain  Has minimal increase in triglycerides and low HDL Last LDL was below 100  Lab Results  Component Value Date   CHOL 162 05/26/2018   HDL 37.70 (L) 05/26/2018   LDLCALC 89 05/26/2018   LDLDIRECT 130.0 01/18/2018   TRIG 178.0 (H) 05/26/2018   CHOLHDL 4 05/26/2018    History of mild hypothyroidism, treated for several years Her TSH  was high in 5/19 and subsequently dosage was increased  She is taking 112 mcg levothyroxine and again TSH was normal She feels fairly good overall  Lab Results  Component Value Date   TSH 1.35 04/03/2019   TSH 2.12 08/29/2018   TSH 6.55 (H) 05/26/2018        Examination:   BP (!) 142/72 (BP Location: Left Arm, Patient Position: Sitting, Cuff Size: Large)   Pulse 69   Ht 5' 6.5" (1.689 m)   Wt 196 lb 6.4 oz (89.1 kg)   SpO2 95%   BMI 31.22 kg/m   Body mass index is  31.22 kg/m.     ASSESSMENT/ PLAN:   Diabetes type 2 With BMI 31  See history of present illness for  discussion of current management, blood sugar patterns and problems identified  Her A1c is still high at 7.4  She again has difficulty monitoring her blood sugars except in the morning Also not checking her blood sugar in the pump at the time of her boluses at lunch and dinner More recently may be a little irregular and remembering to bolus with every meals However despite using Invokana her weight is gradually going up Exercise level has been somewhat less lately except for walking Discussed importance of glucose monitoring, cutting back on carbohydrates, entering blood sugars at the time of meals and also checking blood sugars postprandially to help assess the adequacy of her boluses  She is again not checking blood sugars enough to qualify for a CGM Discussed options for various CGM's and other kinds of insulin pumps including the T-insulin pump which may be reasonable to consider However she will need to have a C-peptide checked for this which is not available In the meantime we will try to get prior authorization for her Contour test strips to go along with her pump instead of using the One Touch meter  Since her blood sugars are mostly high at bedtime we will increase her evening basal rate to 1.6  Renal function: Creatinine is slightly above normal, currently taking both olmesartan and Invokana Although blood pressure is not low may need to consider reducing Invokana if creatinine continues to go up  Microalbuminuria  will need to be followed up periodically  Lipids need to be checked, not scheduled to see cardiologist for some time  Thyroid level stable with current dose of 112 mcg and will check on the next visit  Hypertension: Blood pressure is under fair control, followed by cardiologist Patient reports high readings in the mornings and she will discuss with  cardiologist  Counseling time on subjects discussed in assessment and plan sections is over 50% of today's 25 minute visit    Patient Instructions  Check blood sugars on waking up 3-4 days a week  Also check blood sugars about 2 hours after meals and do this after different meals by rotation  Recommended blood sugar levels on waking up are 90-130 and about 2 hours after meal is 130-160  Please bring your blood sugar monitor to each visit, thank you  Basal rate 1.6 at 7 pm      Elayne Snare 08/14/2019, 9:07 PM

## 2019-08-14 NOTE — Patient Instructions (Signed)
Check blood sugars on waking up 3-4 days a week  Also check blood sugars about 2 hours after meals and do this after different meals by rotation  Recommended blood sugar levels on waking up are 90-130 and about 2 hours after meal is 130-160  Please bring your blood sugar monitor to each visit, thank you  Basal rate 1.6 at 7 pm

## 2019-08-15 LAB — BASIC METABOLIC PANEL
BUN: 32 mg/dL — ABNORMAL HIGH (ref 6–23)
CO2: 23 mEq/L (ref 19–32)
Calcium: 10.3 mg/dL (ref 8.4–10.5)
Chloride: 107 mEq/L (ref 96–112)
Creatinine, Ser: 1.08 mg/dL (ref 0.40–1.20)
GFR: 50.04 mL/min — ABNORMAL LOW (ref >60.00)
Glucose, Bld: 90 mg/dL (ref 70–99)
Potassium: 4.1 mEq/L (ref 3.5–5.1)
Sodium: 138 mEq/L (ref 135–145)

## 2019-08-15 LAB — LIPID PANEL
Cholesterol: 120 mg/dL (ref 0–200)
HDL: 40.2 mg/dL (ref >39.00)
LDL Cholesterol: 43 mg/dL (ref 0–99)
NonHDL: 79.71
Total CHOL/HDL Ratio: 3
Triglycerides: 186 mg/dL — ABNORMAL HIGH (ref 0.0–149.0)
VLDL: 37.2 mg/dL (ref 0.0–40.0)

## 2019-08-28 ENCOUNTER — Other Ambulatory Visit: Payer: Self-pay | Admitting: Endocrinology

## 2019-09-14 ENCOUNTER — Other Ambulatory Visit: Payer: Self-pay | Admitting: Endocrinology

## 2019-11-10 ENCOUNTER — Other Ambulatory Visit: Payer: Self-pay

## 2019-11-10 ENCOUNTER — Other Ambulatory Visit: Payer: Self-pay | Admitting: Endocrinology

## 2019-11-10 ENCOUNTER — Other Ambulatory Visit (INDEPENDENT_AMBULATORY_CARE_PROVIDER_SITE_OTHER): Payer: Medicare Other

## 2019-11-10 DIAGNOSIS — E063 Autoimmune thyroiditis: Secondary | ICD-10-CM | POA: Diagnosis not present

## 2019-11-10 DIAGNOSIS — E1165 Type 2 diabetes mellitus with hyperglycemia: Secondary | ICD-10-CM | POA: Diagnosis not present

## 2019-11-10 DIAGNOSIS — Z794 Long term (current) use of insulin: Secondary | ICD-10-CM | POA: Diagnosis not present

## 2019-11-10 LAB — COMPREHENSIVE METABOLIC PANEL
ALT: 23 U/L (ref 0–35)
AST: 21 U/L (ref 0–37)
Albumin: 4.2 g/dL (ref 3.5–5.2)
Alkaline Phosphatase: 87 U/L (ref 39–117)
BUN: 30 mg/dL — ABNORMAL HIGH (ref 6–23)
CO2: 25 mEq/L (ref 19–32)
Calcium: 10.1 mg/dL (ref 8.4–10.5)
Chloride: 111 mEq/L (ref 96–112)
Creatinine, Ser: 1.22 mg/dL — ABNORMAL HIGH (ref 0.40–1.20)
GFR: 43.45 mL/min — ABNORMAL LOW (ref >60.00)
Glucose, Bld: 89 mg/dL (ref 70–99)
Potassium: 4.7 mEq/L (ref 3.5–5.1)
Sodium: 141 mEq/L (ref 135–145)
Total Bilirubin: 0.5 mg/dL (ref 0.2–1.2)
Total Protein: 7.4 g/dL (ref 6.0–8.3)

## 2019-11-10 LAB — T4, FREE: Free T4: 1.04 ng/dL (ref 0.60–1.60)

## 2019-11-10 LAB — MICROALBUMIN / CREATININE URINE RATIO
Creatinine,U: 86.1 mg/dL
Microalb Creat Ratio: 15.4 mg/g (ref 0.0–30.0)
Microalb, Ur: 13.3 mg/dL — ABNORMAL HIGH (ref 0.0–1.9)

## 2019-11-10 LAB — TSH: TSH: 2.27 u[IU]/mL (ref 0.35–4.50)

## 2019-11-10 LAB — HEMOGLOBIN A1C: Hgb A1c MFr Bld: 7.3 % — ABNORMAL HIGH (ref 4.6–6.5)

## 2019-11-10 MED ORDER — AMLODIPINE BESYLATE 5 MG PO TABS: 5.0000 mg | ORAL_TABLET | Freq: Every day | ORAL | 3 refills | Status: DC

## 2019-11-13 ENCOUNTER — Telehealth: Payer: Self-pay

## 2019-11-13 ENCOUNTER — Other Ambulatory Visit: Payer: Self-pay

## 2019-11-13 NOTE — Telephone Encounter (Signed)
PA initiated via [email or web address] for [employer] next test strips, checking 7 times daily.  Monico Hoar Key: [id number] - PA Case ID: [id number] Need help? Call us at [phone] Status Sent to [employer] [facility] Contour Next Test strips Form [employer] Medicare Part D Electronic Prior Authorization Form (2017 NCPDP)  [employer] is reviewing your PA request. Typically an electronic response will be received within 72 hours. To check for an update later, open this request from your dashboard.  You may close this dialog and return to your dashboard to perform other tasks.

## 2019-11-14 ENCOUNTER — Other Ambulatory Visit: Payer: Medicare Other

## 2019-11-15 ENCOUNTER — Other Ambulatory Visit: Payer: Self-pay

## 2019-11-17 ENCOUNTER — Other Ambulatory Visit: Payer: Self-pay

## 2019-11-17 ENCOUNTER — Ambulatory Visit: Payer: Medicare Other | Admitting: Endocrinology

## 2019-11-17 ENCOUNTER — Encounter: Payer: Self-pay | Admitting: Endocrinology

## 2019-11-17 VITALS — BP 132/76 | HR 73 | Ht 66.5 in | Wt 193.6 lb

## 2019-11-17 DIAGNOSIS — E1129 Type 2 diabetes mellitus with other diabetic kidney complication: Secondary | ICD-10-CM | POA: Diagnosis not present

## 2019-11-17 DIAGNOSIS — E1165 Type 2 diabetes mellitus with hyperglycemia: Secondary | ICD-10-CM | POA: Diagnosis not present

## 2019-11-17 DIAGNOSIS — I10 Essential (primary) hypertension: Secondary | ICD-10-CM

## 2019-11-17 DIAGNOSIS — E063 Autoimmune thyroiditis: Secondary | ICD-10-CM | POA: Diagnosis not present

## 2019-11-17 DIAGNOSIS — Z794 Long term (current) use of insulin: Secondary | ICD-10-CM

## 2019-11-17 DIAGNOSIS — R809 Proteinuria, unspecified: Secondary | ICD-10-CM

## 2019-11-17 NOTE — Progress Notes (Signed)
Patient ID: Crystal Hobbs, female   DOB: 1951/06/16, 69 y.o.   MRN: [id number]   Reason for Appointment: Endocrinology follow-up    History of Present Illness   Diagnosis: Type 2 DIABETES MELITUS, date of diagnosis:  2011   Past history: Her initial presentation with diabetes was with significant symptoms and glucose of 947 Initially was treated with insulin and subsequently on metformin which she could not tolerate  Since she was not responding to Victoza and had significant hyperglycemia she was started on basal bolus insulin regimen instead  However had required relatively large doses of insulin with inadequate control and blood sugars subsequently improved significantly with adding Victoza in 10/13  Her  A1c was 6.4 in 4/14 and she had overall good control with minimal hypoglycemia She was taken off  Afrezza because of her high out-of-pocket expense and she was tried on V-go pump which she had some difficulties getting this through the pharmacy  RECENT history:    Insulin regimen: OMNIPOD insulin pump started on 08/16/2018  BASAL rates 1.6 midnight-7 AM, 7 AM = 1.2, 10 AM = 1.1, 2 PM = 1.2 and 7 PM = 1.6, total basal 33 units  Carbohydrate coverage 1: 10.  Preset boluses 3-5 units at breakfast 4-5 at lunch and 6 units at dinner Correction 1: 50 with target 100-120  Non-insulin hypoglycemic drugs: Invokana 300 mg daily  Her A1c is 7.4 and consistently over 7   Current pump management, blood sugar patterns and problems identified:  She has been able to get approval from her insurance for Contour test strips after prior authorization done  However recent average blood sugar frequency is only 3.1 times a day, previously CGM was discussed  However again she thinks her insurance wants her to switch to One Touch meter  She does not want to switch to the T-Slim-insulin pump and wants to stay with the OmniPod  On her last visit her 7 PM basal rate was increased to  1.6  Although her A1c is still high he does not have any consistently high blood sugars although occasionally may have higher readings midday; she also said that a couple of high readings in her meter were not hers and were from her family member  She has variable amounts of protein in carbohydrate at breakfast but still entering 70 to 90 g of carbohydrate  On her lab work postprandial reading was 89 with premeal reading 106 taking 7 units bolus for eating eggs and toast that morning.  She has started to lose a little weight  Although she is playing tennis only once a week she is trying to walk at least 1 or 2 times a week and also she thinks she is going up and down stairs regularly during the day  Has minimal monitoring after dinner with occasional readings late at night when she is having a snack and bolusing for this  FASTING readings have been generally fairly well controlled with average 128  No hypoglycemia even with exercise   Physical activity: exercise: play tennis 1-3/7 days a week, some walking on treadmill             Side effects from medications:  diarrhea from metformin ? Hair loss from Actos Proper timing of medications in relation to meals: Yes         Monitors blood glucose: As above.    Glucometer:  Contour Next/One Touch ultra .  Blood Glucose readings from pump download and meter review   PRE-MEAL Fasting Lunch Dinner Bedtime Overall  Glucose range: 106-155  88-196  82-205  111-197   Mean/median:  128    167   Usually no postprandial readings available   Previous readings:   PRE-MEAL Fasting Lunch Dinner Bedtime Overall  Glucose range:  117-160  107, 207   155-201   Mean/median:  135     144     Meals:  Usually low carbohydrate  Breakfast: may have half a peanut better sandwich with banana or egg/sausage and toast         Diabetes education visit: Most recent: 05/2012      CDE visit 12/19        Wt Readings from Last 3 Encounters:   11/17/19 193 lb 9.6 oz (87.8 kg)  08/14/19 196 lb 6.4 oz (89.1 kg)  04/07/19 194 lb 3.2 oz (88.1 kg)    Lab Results  Component Value Date   HGBA1C 7.3 (H) 11/10/2019   HGBA1C 7.4 (H) 07/06/2019   HGBA1C 7.6 (H) 04/03/2019   Lab Results  Component Value Date   MICROALBUR 13.3 (H) 11/10/2019   LDLCALC 43 08/14/2019   CREATININE 1.22 (H) 11/10/2019    Other problems discussed today: See review of systems   No visits with results within 1 Week(s) from this visit.  Latest known visit with results is:  Lab on 11/10/2019  Component Date Value Ref Range Status  . Free T4 11/10/2019 1.04  0.60 - 1.60 ng/dL Final   Comment: Specimens from patients who are undergoing biotin therapy and /or ingesting biotin supplements may contain high levels of biotin.  The higher biotin concentration in these specimens interferes with this Free T4 assay.  Specimens that contain high levels  of biotin may cause false high results for this Free T4 assay.  Please interpret results in light of the total clinical presentation of the patient.    Marland Kitchen TSH 11/10/2019 2.27  0.35 - 4.50 uIU/mL Final  . Microalb, Ur 11/10/2019 13.3* 0.0 - 1.9 mg/dL Final  . Creatinine,U 11/10/2019 86.1  mg/dL Final  . Microalb Creat Ratio 11/10/2019 15.4  0.0 - 30.0 mg/g Final  . Sodium 11/10/2019 141  135 - 145 mEq/L Final  . Potassium 11/10/2019 4.7  3.5 - 5.1 mEq/L Final  . Chloride 11/10/2019 111  96 - 112 mEq/L Final  . CO2 11/10/2019 25  19 - 32 mEq/L Final  . Glucose, Bld 11/10/2019 89  70 - 99 mg/dL Final  . BUN 11/10/2019 30* 6 - 23 mg/dL Final  . Creatinine, Ser 11/10/2019 1.22* 0.40 - 1.20 mg/dL Final  . Total Bilirubin 11/10/2019 0.5  0.2 - 1.2 mg/dL Final  . Alkaline Phosphatase 11/10/2019 87  39 - 117 U/L Final  . AST 11/10/2019 21  0 - 37 U/L Final  . ALT 11/10/2019 23  0 - 35 U/L Final  . Total Protein 11/10/2019 7.4  6.0 - 8.3 g/dL Final  . Albumin 11/10/2019 4.2  3.5 - 5.2 g/dL Final  . GFR 11/10/2019 43.45*  >60.00 mL/min Final  . Calcium 11/10/2019 10.1  8.4 - 10.5 mg/dL Final  . Hgb A1c MFr Bld 11/10/2019 7.3* 4.6 - 6.5 % Final   Glycemic Control Guidelines for People with Diabetes:Non Diabetic:  <6%Goal of Therapy: <7%Additional Action Suggested:  >8%     Allergies as of 11/17/2019      Reactions   Actos [pioglitazone]    hairloss  Metformin And Related Diarrhea   Novocain [procaine]    syncopy   Sulfa Antibiotics Itching      Medication List       Accurate as of November 17, 2019  9:29 AM. If you have any questions, ask your nurse or doctor.        STOP taking these medications   insulin aspart 100 UNIT/ML injection Commonly known as: NovoLOG Stopped by: Elayne Snare, MD     TAKE these medications   amLODipine 5 MG tablet Commonly known as: NORVASC Take 1 tablet (5 mg total) by mouth daily.   aspirin EC 81 MG tablet Take 81 mg by mouth daily.   Biotin 5000 MCG Caps Take 1 capsule by mouth daily.   Contour Next Test test strip Generic drug: glucose blood Use Bayer Contour test strips to check blood sugar 7 times daily.   ezetimibe-simvastatin 10-40 MG tablet Commonly known as: VYTORIN Take 1 tablet by mouth daily.   HumaLOG 100 UNIT/ML injection Generic drug: insulin lispro Inject 74 Units into the skin daily. Use max of 74 units under the skin daily via insulin pump.   Invokana 300 MG Tabs tablet Generic drug: canagliflozin TAKE 1 TABLET(300 MG) BY MOUTH DAILY BEFORE BREAKFAST   levothyroxine 112 MCG tablet Commonly known as: SYNTHROID TAKE 1 TABLET(112 MCG) BY MOUTH DAILY   nitroGLYCERIN 0.4 MG SL tablet Commonly known as: NITROSTAT Place 1 tablet (0.4 mg total) under the tongue every 5 (five) minutes as needed for chest pain.   olmesartan 40 MG tablet Commonly known as: BENICAR Take 1 tablet (40 mg total) by mouth daily.   omega-3 acid ethyl esters 1 g capsule Commonly known as: LOVAZA Take 2 g by mouth 2 (two) times daily.   OmniPod Dash System  Kit 1 each by Does not apply route. USE DAILY TO CONTINUOUSLY MONITOR BLOOD SUGAR.   OmniPod Dash 5 Pack Pods Misc 1 each by Does not apply route every 3 (three) days. Apply 1 pod to body once every 3 days for insulin delivery.   Super B Complex/C Caps Take 1 tablet by mouth daily.       Allergies:  Allergies  Allergen Reactions  . Actos [Pioglitazone]     hairloss  . Metformin And Related Diarrhea  . Novocain [Procaine]     syncopy  . Sulfa Antibiotics Itching    Past Medical History:  Diagnosis Date  . Arthritis   . Complication of anesthesia    takes along time to wake up  . Contact lens/glasses fitting    wears glasses or contacts  . Coronary artery disease 01/2007   s/p PCI of diagonal   . Diabetes mellitus without complication ([place])   . Dyslipidemia   . Hyperlipidemia   . Hypertension   . Hypothyroidism   . Snores   . Vertigo     Past Surgical History:  Procedure Laterality Date  . CARDIAC CATHETERIZATION  2008   stent  . SHOULDER ARTHROSCOPY WITH OPEN ROTATOR CUFF REPAIR Right 02/28/2013   Procedure: right shoulder arthroscopy, distal clavicle sculpting, open rotator cuff repair;  Surgeon: Cammie Sickle., MD;  Location: [facility];  Service: Orthopedics;  Laterality: Right;    Family History  Problem Relation Age of Onset  . Hypertension Father   . Heart attack Father     Social History:  reports that she has never smoked. She has never used smokeless tobacco. She reports current alcohol use. She reports that  she does not use drugs.  Review of Systems:  HYPERTENSION:  followed by PCP/cardiologist, Also checks blood pressure at home  Is on Benicar 40 mg with amlodipine 5 mg, also on Invokana  Last urine microalbumin is normal, previously had been high  BP Readings from Last 3 Encounters:  11/17/19 132/76  08/14/19 (!) 142/72  04/07/19 140/70   Lab Results  Component Value Date   CREATININE 1.22 (H) 11/10/2019    CREATININE 1.08 08/14/2019   CREATININE 1.27 (H) 07/06/2019     HYPERLIPIDEMIA: The lipid abnormality consists of elevated LDL and triglycerides, taking Vytorin and Lovaza, followed by cardiologist.   She was tried on Crestor but she thinks this caused leg pain  Has mild increase in triglycerides and low normal HDL Last LDL was below 100  Lab Results  Component Value Date   CHOL 120 08/14/2019   HDL 40.20 08/14/2019   LDLCALC 43 08/14/2019   LDLDIRECT 130.0 01/18/2018   TRIG 186.0 (H) 08/14/2019   CHOLHDL 3 08/14/2019    History of mild hypothyroidism, treated for several years Her TSH was high in 5/19 when she had her last dosage increase  She is taking 112 mcg levothyroxine and her TSH is consistently normal She feels fairly good   Lab Results  Component Value Date   TSH 2.27 11/10/2019   TSH 1.35 04/03/2019   TSH 2.12 08/29/2018        Examination:   BP 132/76 (BP Location: Left Arm, Patient Position: Sitting, Cuff Size: Normal)   Pulse 73   Ht 5' 6.5" (1.689 m)   Wt 193 lb 9.6 oz (87.8 kg)   SpO2 97%   BMI 30.78 kg/m   Body mass index is 30.78 kg/m.     ASSESSMENT/ PLAN:   Diabetes type 2 With BMI 31  See history of present illness for  discussion of current management, blood sugar patterns and problems identified  Her A1c is slightly better at 7.3 compared to 7.4  Blood sugars are averaging about 160 at home but practically all her readings are before meals and not after  She is generally doing better with trying to bolus for all the meals although occasionally in the evenings may be late for boluses and blood sugar may be higher No consistent pattern is seen but will occasionally have higher readings around lunchtime also More recently is trying to increase her exercise and is starting to lose weight Likely not remembering to bolus with every meal and may do some readings postprandially Although lunchtime boluses are sometimes missing she thinks it  may be from meals that are not containing carbohydrate  Recommendations:  Consistent bolusing before every meal based on carbohydrates  Reduce amount of bolus by 2 units when eating less carbohydrate in the morning such as toast and eggs  No change in basal rates  More blood sugars after dinner  Again if checking 4 times a day may be able to get the CGM  Watch for low blood sugars after exercise    Renal function: Creatinine upper normal and has been fluctuating  Microalbuminuria appears to be resolved and will need to be followed up periodically  Lipids show good control in 11/20 with slight increase in triglycerides, continue follow-up with cardiologist  Thyroid level stable with current dose of 112 mcg and she will continue the same dose  Hypertension: Appears well controlled   There are no Patient Instructions on file for this visit.     Elayne Snare  11/17/2019, 9:29 AM

## 2019-11-23 ENCOUNTER — Other Ambulatory Visit: Payer: Self-pay

## 2019-11-23 ENCOUNTER — Telehealth: Payer: Self-pay

## 2019-11-23 MED ORDER — JARDIANCE 25 MG PO TABS: 25.0000 mg | ORAL_TABLET | Freq: Every day | ORAL | 2 refills | Status: DC

## 2019-11-23 NOTE — Telephone Encounter (Signed)
Change to Jardiance 25 mg daily

## 2019-11-23 NOTE — Telephone Encounter (Signed)
Received fax from pt's pharmacy stating that a PA is required for Invokana 300mg  tablets.  Would you like to do PA or change medication?

## 2019-11-23 NOTE — Telephone Encounter (Signed)
Changed Rx to jardiance 25mg  and D/c'd Invokana. Called pt and made her aware. Pt verbalized understanding.

## 2019-12-25 ENCOUNTER — Other Ambulatory Visit: Payer: Self-pay

## 2019-12-25 MED ORDER — INSULIN LISPRO 100 UNIT/ML ~~LOC~~ SOLN: SUBCUTANEOUS | 2 refills | Status: DC

## 2019-12-28 ENCOUNTER — Other Ambulatory Visit: Payer: Self-pay | Admitting: Endocrinology

## 2020-01-09 ENCOUNTER — Other Ambulatory Visit: Payer: Self-pay

## 2020-01-09 MED ORDER — EZETIMIBE-SIMVASTATIN 10-40 MG PO TABS: 1.0000 | ORAL_TABLET | Freq: Every day | ORAL | 0 refills | Status: DC

## 2020-02-23 ENCOUNTER — Other Ambulatory Visit: Payer: Self-pay | Admitting: Endocrinology

## 2020-02-23 ENCOUNTER — Other Ambulatory Visit: Payer: Self-pay | Admitting: Cardiology

## 2020-02-26 ENCOUNTER — Other Ambulatory Visit: Payer: Self-pay | Admitting: Endocrinology

## 2020-03-21 ENCOUNTER — Other Ambulatory Visit: Payer: Medicare Other

## 2020-03-24 ENCOUNTER — Other Ambulatory Visit: Payer: Self-pay | Admitting: Cardiology

## 2020-03-26 ENCOUNTER — Other Ambulatory Visit: Payer: Self-pay | Admitting: Cardiology

## 2020-03-27 ENCOUNTER — Ambulatory Visit: Payer: Medicare Other | Admitting: Endocrinology

## 2020-04-22 ENCOUNTER — Other Ambulatory Visit: Payer: Self-pay | Admitting: Cardiology

## 2020-04-24 LAB — HM DIABETES EYE EXAM

## 2020-04-28 ENCOUNTER — Other Ambulatory Visit: Payer: Self-pay | Admitting: Endocrinology

## 2020-04-29 ENCOUNTER — Encounter: Payer: Self-pay | Admitting: *Deleted

## 2020-05-16 ENCOUNTER — Other Ambulatory Visit: Payer: Self-pay | Admitting: *Deleted

## 2020-05-16 ENCOUNTER — Other Ambulatory Visit: Payer: Self-pay | Admitting: Endocrinology

## 2020-05-16 NOTE — Telephone Encounter (Signed)
Patient requests RX for Levothyroxine. LOV  11/17/19. Patient advised to follow up in 4 months. To date, no follow up has been scheduled. Please advise.

## 2020-05-16 NOTE — Telephone Encounter (Signed)
She can have 15 tablets with note to make appointment with labs

## 2020-05-16 NOTE — Telephone Encounter (Signed)
Refill sent.

## 2020-05-26 ENCOUNTER — Other Ambulatory Visit: Payer: Self-pay | Admitting: Cardiology

## 2020-05-28 MED ORDER — OLMESARTAN MEDOXOMIL 40 MG PO TABS: 40.0000 mg | ORAL_TABLET | Freq: Every day | ORAL | 0 refills | Status: DC

## 2020-06-27 ENCOUNTER — Other Ambulatory Visit: Payer: Self-pay | Admitting: Endocrinology

## 2020-06-27 ENCOUNTER — Other Ambulatory Visit: Payer: Self-pay | Admitting: Cardiology

## 2020-07-11 ENCOUNTER — Other Ambulatory Visit (INDEPENDENT_AMBULATORY_CARE_PROVIDER_SITE_OTHER): Payer: Medicare Other

## 2020-07-11 ENCOUNTER — Other Ambulatory Visit: Payer: Self-pay

## 2020-07-11 DIAGNOSIS — Z794 Long term (current) use of insulin: Secondary | ICD-10-CM

## 2020-07-11 DIAGNOSIS — E1165 Type 2 diabetes mellitus with hyperglycemia: Secondary | ICD-10-CM | POA: Diagnosis not present

## 2020-07-11 LAB — BASIC METABOLIC PANEL
BUN: 31 mg/dL — ABNORMAL HIGH (ref 6–23)
CO2: 27 mEq/L (ref 19–32)
Calcium: 10.2 mg/dL (ref 8.4–10.5)
Chloride: 104 mEq/L (ref 96–112)
Creatinine, Ser: 1.67 mg/dL — ABNORMAL HIGH (ref 0.40–1.20)
GFR: 30.59 mL/min — ABNORMAL LOW (ref >60.00)
Glucose, Bld: 104 mg/dL — ABNORMAL HIGH (ref 70–99)
Potassium: 4 mEq/L (ref 3.5–5.1)
Sodium: 139 mEq/L (ref 135–145)

## 2020-07-11 LAB — HEMOGLOBIN A1C: Hgb A1c MFr Bld: 7.2 % — ABNORMAL HIGH (ref 4.6–6.5)

## 2020-07-15 ENCOUNTER — Ambulatory Visit: Payer: Medicare Other | Admitting: Endocrinology

## 2020-07-15 ENCOUNTER — Other Ambulatory Visit: Payer: Self-pay

## 2020-07-15 ENCOUNTER — Encounter: Payer: Self-pay | Admitting: Endocrinology

## 2020-07-15 VITALS — BP 120/68 | HR 86 | Ht 66.0 in | Wt 177.8 lb

## 2020-07-15 DIAGNOSIS — Z23 Encounter for immunization: Secondary | ICD-10-CM

## 2020-07-15 DIAGNOSIS — N289 Disorder of kidney and ureter, unspecified: Secondary | ICD-10-CM | POA: Diagnosis not present

## 2020-07-15 DIAGNOSIS — E1169 Type 2 diabetes mellitus with other specified complication: Secondary | ICD-10-CM

## 2020-07-15 DIAGNOSIS — I10 Essential (primary) hypertension: Secondary | ICD-10-CM | POA: Diagnosis not present

## 2020-07-15 DIAGNOSIS — E669 Obesity, unspecified: Secondary | ICD-10-CM

## 2020-07-15 DIAGNOSIS — E063 Autoimmune thyroiditis: Secondary | ICD-10-CM

## 2020-07-15 NOTE — Patient Instructions (Addendum)
Cut Olmesartan in 1/2   Check blood sugars on waking up 3-4  days a week  Also check blood sugars about 2 hours after meals and do this after different meals by rotation  Recommended blood sugar levels on waking up are 90-130 and about 2 hours after meal is 130-160  Please bring your blood sugar monitor to each visit, thank you

## 2020-07-15 NOTE — Progress Notes (Signed)
Patient ID: Crystal Hobbs, female   DOB: November 04, 1950, 69 y.o.   MRN: [id number]   Reason for Appointment: Endocrinology follow-up    History of Present Illness   Diagnosis: Type 2 DIABETES MELITUS, date of diagnosis:  2011   Past history: Her initial presentation with diabetes was with significant symptoms and glucose of 947 Initially was treated with insulin and subsequently on metformin which she could not tolerate  Since she was not responding to Victoza and had significant hyperglycemia she was started on basal bolus insulin regimen instead  However had required relatively large doses of insulin with inadequate control and blood sugars subsequently improved significantly with adding Victoza in 10/13  Her  A1c was 6.4 in 4/14 and she had overall good control with minimal hypoglycemia She was taken off  Afrezza because of her high out-of-pocket expense and she was tried on V-go pump which she had some difficulties getting this through the pharmacy  Previous insulin settings: BASAL rates 1.6 midnight-7 AM, 7 AM = 1.2, 10 AM = 1.1, 2 PM = 1.2 and 7 PM = 1.6, total basal 33 units Carbohydrate coverage 1: 10.  Preset boluses 3-5 units at breakfast 4-5 at lunch and 6 units at dinner Correction 1: 50 with target 100-120  RECENT history:    Insulin regimen: None, previously on OMNIPOD insulin pump started on 08/16/2018  Non-insulin hypoglycemic drugs: Jardiance 25 mg daily  Her A1c is 7.2, previously 7.3  Current pump management, blood sugar patterns and problems identified:  About 6 weeks or so ago she removed on her own stopped her insulin pump  For at least a couple of months she has been trying to significantly change her diet with cutting back on portions and carbohydrates and has lost nearly 20 pounds  When her sugar was 59 in the morning she stopped her insulin pump on her own and is only on Jardiance  Previously on Invokana  She has also been able to increase her  walking especially since her back pain is better  She is using her Contour meter and checking blood sugars very regularly up to 4 times a day  Does not appear to have any significantly high readings after her meals also  Fasting readings are in the 120s average   Physical activity: exercise: Walking up to 3 miles a day, also plays tennis 1-3/7 days a week,             Side effects from medications:  diarrhea from metformin ? Hair loss from Actos Proper timing of medications in relation to meals: Yes         Monitors blood glucose: As above.    Glucometer:  Contour Next/One  .          Blood Glucose readings from download and meter review   PRE-MEAL Fasting Lunch  afternoon  6 PM + Overall  Glucose range:  110-146   69-245  94-167   Mean/median:  123  122  110  126 123   Previously:  PRE-MEAL Fasting Lunch Dinner Bedtime Overall  Glucose range: 106-155  88-196  82-205  111-197   Mean/median:  128    167     Meals:  Usually low carbohydrate         Diabetes education visit: Most recent: 05/2012      CDE visit 12/19        Wt Readings from Last 3 Encounters:  07/15/20 177 lb 12.8 oz (80.6 kg)  11/17/19 193 lb 9.6 oz (87.8 kg)  08/14/19 196 lb 6.4 oz (89.1 kg)    Lab Results  Component Value Date   HGBA1C 7.2 (H) 07/11/2020   HGBA1C 7.3 (H) 11/10/2019   HGBA1C 7.4 (H) 07/06/2019   Lab Results  Component Value Date   MICROALBUR 13.3 (H) 11/10/2019   LDLCALC 43 08/14/2019   CREATININE 1.67 (H) 07/11/2020    Other problems discussed today: See review of systems   Lab on 07/11/2020  Component Date Value Ref Range Status   Sodium 07/11/2020 139  135 - 145 mEq/L Final   Potassium 07/11/2020 4.0  3.5 - 5.1 mEq/L Final   Chloride 07/11/2020 104  96 - 112 mEq/L Final   CO2 07/11/2020 27  19 - 32 mEq/L Final   Glucose, Bld 07/11/2020 104* 70 - 99 mg/dL Final   BUN 07/11/2020 31* 6 - 23 mg/dL Final   Creatinine, Ser 07/11/2020 1.67* 0.40 - 1.20 mg/dL Final    GFR 07/11/2020 30.59* >60.00 mL/min Final   Calculated using the CKD-EPI Creatinine Equation (2021)   Calcium 07/11/2020 10.2  8.4 - 10.5 mg/dL Final   Hgb A1c MFr Bld 07/11/2020 7.2* 4.6 - 6.5 % Final   Glycemic Control Guidelines for People with Diabetes:Non Diabetic:  <6%Goal of Therapy: <7%Additional Action Suggested:  >8%     Allergies as of 07/15/2020      Reactions   Actos [pioglitazone]    hairloss   Metformin And Related Diarrhea   Novocain [procaine]    syncopy   Sulfa Antibiotics Itching      Medication List       Accurate as of July 15, 2020  1:30 PM. If you have any questions, ask your nurse or doctor.        amLODipine 5 MG tablet Commonly known as: NORVASC Take 1 tablet (5 mg total) by mouth daily.   aspirin EC 81 MG tablet Take 81 mg by mouth daily.   Biotin 5000 MCG Caps Take 1 capsule by mouth daily.   Contour Next Test test strip Generic drug: glucose blood Use Bayer Contour test strips to check blood sugar 7 times daily.   ezetimibe-simvastatin 10-40 MG tablet Commonly known as: VYTORIN TAKE 1 TABLET BY MOUTH DAILY AT 6 PM   insulin lispro 100 UNIT/ML injection Commonly known as: HumaLOG Use max of 74 units under the skin daily via insulin pump.   Jardiance 25 MG Tabs tablet Generic drug: empagliflozin TAKE 1 TABLET BY MOUTH EVERY DAY   levothyroxine 112 MCG tablet Commonly known as: SYNTHROID Take one tablet daily. No refills without follow up appointment w/labs.   nitroGLYCERIN 0.4 MG SL tablet Commonly known as: NITROSTAT Place 1 tablet (0.4 mg total) under the tongue every 5 (five) minutes as needed for chest pain.   olmesartan 40 MG tablet Commonly known as: BENICAR TAKE 1 TABLET BY MOUTH EVERY DAY   omega-3 acid ethyl esters 1 g capsule Commonly known as: LOVAZA Take 2 g by mouth 2 (two) times daily.   OmniPod Dash System Kit 1 each by Does not apply route. USE DAILY TO CONTINUOUSLY MONITOR BLOOD SUGAR.   OmniPod  Dash 5 Pack Pods Misc CHANGE DASH PODS EVERY 3 DAYS AS DIRECTED FOR INSULIN DELIVERY   Super B Complex/C Caps Take 1 tablet by mouth daily.       Allergies:  Allergies  Allergen Reactions   Actos [Pioglitazone]     hairloss   Metformin And Related Diarrhea  Novocain [Procaine]     syncopy   Sulfa Antibiotics Itching    Past Medical History:  Diagnosis Date   Arthritis    Complication of anesthesia    takes along time to wake up   Contact lens/glasses fitting    wears glasses or contacts   Coronary artery disease 01/2007   s/p PCI of diagonal    Diabetes mellitus without complication ([place])    Dyslipidemia    Hyperlipidemia    Hypertension    Hypothyroidism    Snores    Vertigo     Past Surgical History:  Procedure Laterality Date   CARDIAC CATHETERIZATION  2008   stent   SHOULDER ARTHROSCOPY WITH OPEN ROTATOR CUFF REPAIR Right 02/28/2013   Procedure: right shoulder arthroscopy, distal clavicle sculpting, open rotator cuff repair;  Surgeon: Cammie Sickle., MD;  Location: [facility];  Service: Orthopedics;  Laterality: Right;    Family History  Problem Relation Age of Onset   Hypertension Father    Heart attack Father     Social History:  reports that she has never smoked. She has never used smokeless tobacco. She reports current alcohol use. She reports that she does not use drugs.  Review of Systems:  HYPERTENSION:  followed by PCP/cardiologist, Also checks blood pressure at home 120-130/70-75  Is on Benicar 40 mg with amlodipine 5 mg, also on Invokana  Last urine microalbumin is normal, previously had been high  BP Readings from Last 3 Encounters:  07/15/20 128/80  11/17/19 132/76  08/14/19 (!) 142/72   RENAL function: She has not taken any Aleve or ibuprofen type products but her renal function is worse Also is on Jardiance  Lab Results  Component Value Date   CREATININE 1.67 (H) 07/11/2020   CREATININE  1.22 (H) 11/10/2019   CREATININE 1.08 08/14/2019     HYPERLIPIDEMIA: The lipid abnormality consists of elevated LDL and triglycerides, taking Vytorin and Lovaza, followed by cardiologist.   She was tried on Crestor but she thinks this caused leg pain  Has mild increase in triglycerides and low normal HDL Last LDL was below 100  Lab Results  Component Value Date   CHOL 120 08/14/2019   HDL 40.20 08/14/2019   LDLCALC 43 08/14/2019   LDLDIRECT 130.0 01/18/2018   TRIG 186.0 (H) 08/14/2019   CHOLHDL 3 08/14/2019    History of mild hypothyroidism, treated for several years Her TSH was high in 5/19 when she had her last dosage increase  She is taking 112 mcg levothyroxine and her TSH is consistently normal No fatigue   Lab Results  Component Value Date   TSH 2.27 11/10/2019   TSH 1.35 04/03/2019   TSH 2.12 08/29/2018        Examination:   BP 128/80    Pulse 86    Ht _0  (1.676 m)    Wt 177 lb 12.8 oz (80.6 kg)    SpO2 95%    BMI 28.70 kg/m   Body mass index is 28.7 kg/m.     ASSESSMENT/ PLAN:   Diabetes type 2 With BMI 31  See history of present illness for  discussion of current management, blood sugar patterns and problems identified  Her A1c is slightly better at 7.2  Also previously insulin-dependent for last few years he has been able to stop her insulin with radically changing her diet, cutting back on carbohydrates and portions and losing about 16 pounds  Currently blood sugars are averaging only  about 125 with Jardiance alone No consistent pattern of high readings seen and she has average blood sugar around 120 throughout the day   Renal function: Creatinine has gone up possibly from overall reduction in blood pressure and combination of bruising Jardiance and ARB drug For now she can try reducing her Benicar in half and have renal function recheck with PCP at upcoming visit  Microalbuminuria needs to be rechecked on the next visit  Thyroid level  needs to be rechecked by her PCP on upcoming visit; current dose of 112 mcg and subjectively doing well  Hypertension: Well-controlled, blood pressure slightly better than usual for weight loss  Influenza vaccine given  There are no Patient Instructions on file for this visit.     Elayne Snare 07/15/2020, 1:30 PM

## 2020-09-16 ENCOUNTER — Other Ambulatory Visit: Payer: Self-pay

## 2020-09-16 ENCOUNTER — Other Ambulatory Visit (INDEPENDENT_AMBULATORY_CARE_PROVIDER_SITE_OTHER): Payer: Medicare Other

## 2020-09-16 DIAGNOSIS — E1169 Type 2 diabetes mellitus with other specified complication: Secondary | ICD-10-CM

## 2020-09-16 DIAGNOSIS — E063 Autoimmune thyroiditis: Secondary | ICD-10-CM

## 2020-09-16 DIAGNOSIS — E669 Obesity, unspecified: Secondary | ICD-10-CM | POA: Diagnosis not present

## 2020-09-16 LAB — BASIC METABOLIC PANEL
BUN: 33 mg/dL — ABNORMAL HIGH (ref 6–23)
CO2: 27 mEq/L (ref 19–32)
Calcium: 10.1 mg/dL (ref 8.4–10.5)
Chloride: 105 mEq/L (ref 96–112)
Creatinine, Ser: 1.16 mg/dL (ref 0.40–1.20)
GFR: 47.31 mL/min — ABNORMAL LOW (ref >60.00)
Glucose, Bld: 135 mg/dL — ABNORMAL HIGH (ref 70–99)
Potassium: 4.3 mEq/L (ref 3.5–5.1)
Sodium: 139 mEq/L (ref 135–145)

## 2020-09-16 LAB — TSH: TSH: 3.78 u[IU]/mL (ref 0.35–4.50)

## 2020-09-16 LAB — HEMOGLOBIN A1C: Hgb A1c MFr Bld: 6.9 % — ABNORMAL HIGH (ref 4.6–6.5)

## 2020-09-19 ENCOUNTER — Other Ambulatory Visit: Payer: Self-pay | Admitting: Endocrinology

## 2020-09-19 ENCOUNTER — Encounter: Payer: Self-pay | Admitting: Endocrinology

## 2020-09-19 ENCOUNTER — Other Ambulatory Visit: Payer: Self-pay

## 2020-09-19 ENCOUNTER — Ambulatory Visit: Payer: Medicare Other | Admitting: Endocrinology

## 2020-09-19 VITALS — BP 148/80 | HR 71 | Ht 67.0 in | Wt 172.4 lb

## 2020-09-19 DIAGNOSIS — E1169 Type 2 diabetes mellitus with other specified complication: Secondary | ICD-10-CM | POA: Diagnosis not present

## 2020-09-19 DIAGNOSIS — E063 Autoimmune thyroiditis: Secondary | ICD-10-CM

## 2020-09-19 DIAGNOSIS — I10 Essential (primary) hypertension: Secondary | ICD-10-CM

## 2020-09-19 DIAGNOSIS — E669 Obesity, unspecified: Secondary | ICD-10-CM | POA: Diagnosis not present

## 2020-09-19 DIAGNOSIS — N289 Disorder of kidney and ureter, unspecified: Secondary | ICD-10-CM

## 2020-09-19 LAB — GLUCOSE, POCT (MANUAL RESULT ENTRY): POC Glucose: 161 mg/dl — AB (ref 70–99)

## 2020-09-19 LAB — MICROALBUMIN / CREATININE URINE RATIO
Creatinine,U: 37.2 mg/dL
Microalb Creat Ratio: 45.7 mg/g — ABNORMAL HIGH (ref 0.0–30.0)
Microalb, Ur: 17 mg/dL — ABNORMAL HIGH (ref 0.0–1.9)

## 2020-09-19 MED ORDER — OLMESARTAN MEDOXOMIL 20 MG PO TABS: 20.0000 mg | ORAL_TABLET | Freq: Every day | ORAL | 1 refills | Status: DC

## 2020-09-19 NOTE — Progress Notes (Signed)
Patient ID: Crystal Hobbs, female   DOB: 1950-12-02, 70 y.o.   MRN: [id number]   Reason for Appointment: Endocrinology follow-up    History of Present Illness   Diagnosis: Type 2 DIABETES MELITUS, date of diagnosis:  2011   Past history: Her initial presentation with diabetes was with significant symptoms and glucose of 947 Initially was treated with insulin and subsequently on metformin which she could not tolerate  Since she was not responding to Victoza and had significant hyperglycemia she was started on basal bolus insulin regimen instead  However had required relatively large doses of insulin with inadequate control and blood sugars subsequently improved significantly with adding Victoza in 10/13  Her  A1c was 6.4 in 4/14 and she had overall good control with minimal hypoglycemia She was taken off  Afrezza because of her high out-of-pocket expense and she was tried on V-go pump which she had some difficulties getting this through the pharmacy  Previous insulin settings: BASAL rates 1.6 midnight-7 AM, 7 AM = 1.2, 10 AM = 1.1, 2 PM = 1.2 and 7 PM = 1.6, total basal 33 units Carbohydrate coverage 1: 10.  Preset boluses 3-5 units at breakfast 4-5 at lunch and 6 units at dinner Correction 1: 50 with target 100-120  RECENT history:    Insulin regimen: None, previously on OMNIPOD insulin pump started on 08/16/2018  Non-insulin hypoglycemic drugs: Jardiance 25 mg daily  Her A1c is 6.9 compared to 7.2  Current pump management, blood sugar patterns and problems identified:  She has been off insulin since about 05/2020  Overall still able to continue losing weight with her low carbohydrate and decreased portions in her diet  Also generally trying to be active with exercise although this may have been variable last month  Although she was checking her sugars several times a day she did not bring her meter and not clear what her average blood sugars are compared to the last  visit  She thinks that her blood sugars are not higher than about 165 after meals 161 today about 2 hours after eating  She said that sometimes will have a higher reading in the morning she has a snack like peanuts before bedtime, usually under 140  Otherwise fasting blood sugars are reportedly between 110-115  Weight is down 5 pounds   Physical activity: exercise: Walking up to 3 miles a day, also plays tennis 0-3/7 days a week,             Side effects from medications:  diarrhea from metformin ? Hair loss from Actos Proper timing of medications in relation to meals: Yes         Monitors blood glucose: As above.    Glucometer:  Contour Next/One  .          Blood Glucose readings as above Previous data:   PRE-MEAL Fasting Lunch  afternoon  6 PM + Overall  Glucose range:  110-146   69-245  94-167   Mean/median:  123  122  110  126 123     Meals:  Usually low carbohydrate         Diabetes education visit: Most recent: 05/2012      CDE visit 12/19        Wt Readings from Last 3 Encounters:  09/19/20 172 lb 6.4 oz (78.2 kg)  07/15/20 177 lb 12.8 oz (80.6 kg)  11/17/19 193 lb 9.6 oz (87.8 kg)    Lab Results  Component  Value Date   HGBA1C 6.9 (H) 09/16/2020   HGBA1C 7.2 (H) 07/11/2020   HGBA1C 7.3 (H) 11/10/2019   Lab Results  Component Value Date   MICROALBUR 13.3 (H) 11/10/2019   LDLCALC 43 08/14/2019   CREATININE 1.16 09/16/2020    Other problems discussed today: See review of systems   Office Visit on 09/19/2020  Component Date Value Ref Range Status  . POC Glucose 09/19/2020 161* 70 - 99 mg/dl Final  Lab on 09/16/2020  Component Date Value Ref Range Status  . TSH 09/16/2020 3.78  0.35 - 4.50 uIU/mL Final  . Sodium 09/16/2020 139  135 - 145 mEq/L Final  . Potassium 09/16/2020 4.3  3.5 - 5.1 mEq/L Final  . Chloride 09/16/2020 105  96 - 112 mEq/L Final  . CO2 09/16/2020 27  19 - 32 mEq/L Final  . Glucose, Bld 09/16/2020 135* 70 - 99 mg/dL Final  . BUN  09/16/2020 33* 6 - 23 mg/dL Final  . Creatinine, Ser 09/16/2020 1.16  0.40 - 1.20 mg/dL Final  . GFR 09/16/2020 47.31* >60.00 mL/min Final   Calculated using the CKD-EPI Creatinine Equation (2021)  . Calcium 09/16/2020 10.1  8.4 - 10.5 mg/dL Final  . Hgb A1c MFr Bld 09/16/2020 6.9* 4.6 - 6.5 % Final   Glycemic Control Guidelines for People with Diabetes:Non Diabetic:  <6%Goal of Therapy: <7%Additional Action Suggested:  >8%     Allergies as of 09/19/2020      Reactions   Actos [pioglitazone]    hairloss   Metformin And Related Diarrhea   Novocain [procaine]    syncopy   Sulfa Antibiotics Itching      Medication List       Accurate as of September 19, 2020  1:43 PM. If you have any questions, ask your nurse or doctor.        amLODipine 5 MG tablet Commonly known as: NORVASC Take 1 tablet (5 mg total) by mouth daily.   aspirin EC 81 MG tablet Take 81 mg by mouth daily.   Biotin 5000 MCG Caps Take 1 capsule by mouth daily.   Contour Next Test test strip Generic drug: glucose blood Use Bayer Contour test strips to check blood sugar 7 times daily.   ezetimibe-simvastatin 10-40 MG tablet Commonly known as: VYTORIN TAKE 1 TABLET BY MOUTH DAILY AT 6 PM   insulin lispro 100 UNIT/ML injection Commonly known as: HumaLOG Use max of 74 units under the skin daily via insulin pump.   Jardiance 25 MG Tabs tablet Generic drug: empagliflozin TAKE 1 TABLET BY MOUTH EVERY DAY   levothyroxine 112 MCG tablet Commonly known as: SYNTHROID Take one tablet daily. No refills without follow up appointment w/labs.   nitroGLYCERIN 0.4 MG SL tablet Commonly known as: NITROSTAT Place 1 tablet (0.4 mg total) under the tongue every 5 (five) minutes as needed for chest pain.   olmesartan 40 MG tablet Commonly known as: BENICAR TAKE 1 TABLET BY MOUTH EVERY DAY   omega-3 acid ethyl esters 1 g capsule Commonly known as: LOVAZA Take 2 g by mouth 2 (two) times daily.   OmniPod Dash System  Kit 1 each by Does not apply route. USE DAILY TO CONTINUOUSLY MONITOR BLOOD SUGAR.   OmniPod Dash 5 Pack Pods Misc CHANGE DASH PODS EVERY 3 DAYS AS DIRECTED FOR INSULIN DELIVERY   Super B Complex/C Caps Take 1 tablet by mouth daily.       Allergies:  Allergies  Allergen Reactions  . Actos [Pioglitazone]  hairloss  . Metformin And Related Diarrhea  . Novocain [Procaine]     syncopy  . Sulfa Antibiotics Itching    Past Medical History:  Diagnosis Date  . Arthritis   . Complication of anesthesia    takes along time to wake up  . Contact lens/glasses fitting    wears glasses or contacts  . Coronary artery disease 01/2007   s/p PCI of diagonal   . Diabetes mellitus without complication ([place])   . Dyslipidemia   . Hyperlipidemia   . Hypertension   . Hypothyroidism   . Snores   . Vertigo     Past Surgical History:  Procedure Laterality Date  . CARDIAC CATHETERIZATION  2008   stent  . SHOULDER ARTHROSCOPY WITH OPEN ROTATOR CUFF REPAIR Right 02/28/2013   Procedure: right shoulder arthroscopy, distal clavicle sculpting, open rotator cuff repair;  Surgeon: Cammie Sickle., MD;  Location: [facility];  Service: Orthopedics;  Laterality: Right;    Family History  Problem Relation Age of Onset  . Hypertension Father   . Heart attack Father     Social History:  reports that she has never smoked. She has never used smokeless tobacco. She reports current alcohol use. She reports that she does not use drugs.  Review of Systems:  HYPERTENSION:  followed by PCP/cardiologist, Also checks blood pressure at home, recently about 135/70-78 Her drugs include Benicar 40 mg with amlodipine 5 mg She was told to take half tablet on her last visit of Benicar but recently has been out of her medication as she is waiting for refill from cardiologist  Last urine microalbumin is normal, previously had been high  BP Readings from Last 3 Encounters:  09/19/20 (!)  148/80  07/15/20 120/68  11/17/19 132/76   RENAL function: She had a high creatinine in October but this appears to be better, recently out of her Benicar Previously was told to take only half a tablet Her PCP told her to increase her fluid intake Also is on Jardiance  Lab Results  Component Value Date   CREATININE 1.16 09/16/2020   CREATININE 1.67 (H) 07/11/2020   CREATININE 1.22 (H) 11/10/2019     HYPERLIPIDEMIA: The lipid abnormality consists of elevated LDL and triglycerides, taking Vytorin and Lovaza, followed by cardiologist.   She was tried on Crestor but she thinks this caused leg pain  Has mild increase in triglycerides and low normal HDL Last LDL was below 100  Due for follow-up with cardiologist in 2/22  Lab Results  Component Value Date   CHOL 120 08/14/2019   HDL 40.20 08/14/2019   LDLCALC 43 08/14/2019   LDLDIRECT 130.0 01/18/2018   TRIG 186.0 (H) 08/14/2019   CHOLHDL 3 08/14/2019    History of mild hypothyroidism, treated for several years Her TSH was high in 5/19 when she had her last dosage increase  She is taking 112 mcg levothyroxine and her TSH is consistently normal She feels good   Lab Results  Component Value Date   TSH 3.78 09/16/2020   TSH 2.27 11/10/2019   TSH 1.35 04/03/2019        Examination:   BP (!) 148/80   Pulse 71   Ht 5' 7" (1.702 m)   Wt 172 lb 6.4 oz (78.2 kg)   SpO2 95%   BMI 27.00 kg/m   Body mass index is 27 kg/m.     ASSESSMENT/ PLAN:   Diabetes type 2   See history of present  illness for  discussion of current management, blood sugar patterns and problems identified  Her A1c is slightly better at 6.9 compared to 7.2  She has lost over 20 pounds since she significantly changed her diet She is trying to do regular walking and sometimes also tennis for exercise  Continues to have good control with Jardiance 25 mg alone Occasionally has high readings but likely not consistently since A1c is improved,  unable to download her meter today since she forgot it  Renal function: Creatinine had gone up possibly from previous reduction in blood pressure With reducing the dose or not taking her Benicar her creatinine is back to normal  Microalbuminuria needs to be rechecked today  Hypothyroidism: Well-controlled  Hypertension: Well-controlled, blood pressure slightly higher especially at the office today Since blood pressure is trending higher than usual she will go back to at least 20 mg of Benicar for now and follow-up with cardiologist next month Will also need repeat creatinine levels  Follow-up in 4 months unless she has high sugars  Needs follow-up lipids, is followed by cardiologist  There are no Patient Instructions on file for this visit.     Elayne Snare 09/19/2020, 1:43 PM

## 2020-10-16 ENCOUNTER — Other Ambulatory Visit: Payer: Self-pay | Admitting: Endocrinology

## 2020-12-17 ENCOUNTER — Other Ambulatory Visit: Payer: Self-pay | Admitting: Endocrinology

## 2021-01-09 ENCOUNTER — Other Ambulatory Visit: Payer: Self-pay | Admitting: Cardiology

## 2021-01-21 ENCOUNTER — Other Ambulatory Visit: Payer: Self-pay

## 2021-01-21 ENCOUNTER — Other Ambulatory Visit (INDEPENDENT_AMBULATORY_CARE_PROVIDER_SITE_OTHER): Payer: Medicare Other

## 2021-01-21 DIAGNOSIS — E1169 Type 2 diabetes mellitus with other specified complication: Secondary | ICD-10-CM

## 2021-01-21 DIAGNOSIS — E063 Autoimmune thyroiditis: Secondary | ICD-10-CM

## 2021-01-21 DIAGNOSIS — E669 Obesity, unspecified: Secondary | ICD-10-CM | POA: Diagnosis not present

## 2021-01-21 LAB — TSH: TSH: 2.17 u[IU]/mL (ref 0.35–4.50)

## 2021-01-21 LAB — HEMOGLOBIN A1C: Hgb A1c MFr Bld: 7.8 % — ABNORMAL HIGH (ref 4.6–6.5)

## 2021-01-21 LAB — BASIC METABOLIC PANEL
BUN: 27 mg/dL — ABNORMAL HIGH (ref 6–23)
CO2: 28 mEq/L (ref 19–32)
Calcium: 10.4 mg/dL (ref 8.4–10.5)
Chloride: 106 mEq/L (ref 96–112)
Creatinine, Ser: 1.16 mg/dL (ref 0.40–1.20)
GFR: 47.2 mL/min — ABNORMAL LOW (ref >60.00)
Glucose, Bld: 145 mg/dL — ABNORMAL HIGH (ref 70–99)
Potassium: 4.6 mEq/L (ref 3.5–5.1)
Sodium: 141 mEq/L (ref 135–145)

## 2021-01-23 ENCOUNTER — Other Ambulatory Visit: Payer: Self-pay | Admitting: *Deleted

## 2021-01-23 ENCOUNTER — Other Ambulatory Visit: Payer: Self-pay

## 2021-01-23 ENCOUNTER — Encounter: Payer: Self-pay | Admitting: Endocrinology

## 2021-01-23 ENCOUNTER — Other Ambulatory Visit: Payer: Self-pay | Admitting: Endocrinology

## 2021-01-23 ENCOUNTER — Ambulatory Visit: Payer: Medicare Other | Admitting: Endocrinology

## 2021-01-23 VITALS — BP 132/64 | HR 65 | Ht 67.0 in | Wt 172.8 lb

## 2021-01-23 DIAGNOSIS — E1165 Type 2 diabetes mellitus with hyperglycemia: Secondary | ICD-10-CM | POA: Diagnosis not present

## 2021-01-23 DIAGNOSIS — E063 Autoimmune thyroiditis: Secondary | ICD-10-CM

## 2021-01-23 DIAGNOSIS — E782 Mixed hyperlipidemia: Secondary | ICD-10-CM

## 2021-01-23 DIAGNOSIS — I10 Essential (primary) hypertension: Secondary | ICD-10-CM | POA: Diagnosis not present

## 2021-01-23 MED ORDER — OZEMPIC (0.25 OR 0.5 MG/DOSE) 2 MG/1.5ML ~~LOC~~ SOPN: 0.2500 mg | PEN_INJECTOR | SUBCUTANEOUS | 0 refills | Status: DC

## 2021-01-23 NOTE — Progress Notes (Signed)
Patient ID: Crystal Hobbs, female   DOB: 12/07/1950, 70 y.o.   MRN: [id number]   Reason for Appointment: Endocrinology follow-up    History of Present Illness   Diagnosis: Type 2 DIABETES MELITUS, date of diagnosis:  2011   Past history: Her initial presentation with diabetes was with significant symptoms and glucose of 947 Initially was treated with insulin and subsequently on metformin which she could not tolerate  Since she was not responding to Victoza and had significant hyperglycemia she was started on basal bolus insulin regimen instead  However had required relatively large doses of insulin with inadequate control and blood sugars subsequently improved significantly with adding Victoza in 10/13  Her  A1c was 6.4 in 4/14 and she had overall good control with minimal hypoglycemia She was taken off  Afrezza because of her high out-of-pocket expense and she was tried on V-go pump which she had some difficulties getting this through the pharmacy  Previous insulin settings: BASAL rates 1.6 midnight-7 AM, 7 AM = 1.2, 10 AM = 1.1, 2 PM = 1.2 and 7 PM = 1.6, total basal 33 units Carbohydrate coverage 1: 10.  Preset boluses 3-5 units at breakfast 4-5 at lunch and 6 units at dinner Correction 1: 50 with target 100-120  RECENT history:    Insulin regimen: None, previously on OMNIPOD insulin pump started on 08/16/2018  Non-insulin hypoglycemic drugs: Jardiance 25 mg daily  Her A1c is 7.8, previously 6.9   Current pump management, blood sugar patterns and problems identified:  She has been off insulin since about 05/2020  Although her weight is still about the same her A1c has gone up significantly  She thinks she had been inconsistent with her diet in April  However even though her fasting readings are not significantly higher she had better readings previously  Has only 1 recent reading after dinner that was high  She is exercising quite regularly, mostly playing tennis  recently  She has been reluctant to go back on insulin   Physical activity: exercise: Walking up to 3 miles a day or plays tennis 3/7 days a week,             Side effects from medications:  diarrhea from metformin ? Hair loss from Actos Proper timing of medications in relation to meals: Yes         Monitors blood glucose: As above.    Glucometer:  Contour Next/One  .          Blood Glucose readings from download   PRE-MEAL Fasting Lunch Dinner Bedtime Overall  Glucose range:  127-153    122, 177   Mean/median:  138  128   150  38           Diabetes education visit: Most recent: 05/2012      CDE visit 12/19        Wt Readings from Last 3 Encounters:  01/23/21 172 lb 12.8 oz (78.4 kg)  09/19/20 172 lb 6.4 oz (78.2 kg)  07/15/20 177 lb 12.8 oz (80.6 kg)    Lab Results  Component Value Date   HGBA1C 7.8 (H) 01/21/2021   HGBA1C 6.9 (H) 09/16/2020   HGBA1C 7.2 (H) 07/11/2020   Lab Results  Component Value Date   MICROALBUR 17.0 (H) 09/19/2020   LDLCALC 43 08/14/2019   CREATININE 1.16 01/21/2021    Other problems discussed today: See review of systems   Lab on 01/21/2021  Component Date Value Ref Range  Status  . TSH 01/21/2021 2.17  0.35 - 4.50 uIU/mL Final  . Sodium 01/21/2021 141  135 - 145 mEq/L Final  . Potassium 01/21/2021 4.6  3.5 - 5.1 mEq/L Final  . Chloride 01/21/2021 106  96 - 112 mEq/L Final  . CO2 01/21/2021 28  19 - 32 mEq/L Final  . Glucose, Bld 01/21/2021 145* 70 - 99 mg/dL Final  . BUN 01/21/2021 27* 6 - 23 mg/dL Final  . Creatinine, Ser 01/21/2021 1.16  0.40 - 1.20 mg/dL Final  . GFR 01/21/2021 47.20* >60.00 mL/min Final   Calculated using the CKD-EPI Creatinine Equation (2021)  . Calcium 01/21/2021 10.4  8.4 - 10.5 mg/dL Final  . Hgb A1c MFr Bld 01/21/2021 7.8* 4.6 - 6.5 % Final   Glycemic Control Guidelines for People with Diabetes:Non Diabetic:  <6%Goal of Therapy: <7%Additional Action Suggested:  >8%     Allergies as of 01/23/2021       Reactions   Actos [pioglitazone]    hairloss   Metformin And Related Diarrhea   Novocain [procaine]    syncopy   Sulfa Antibiotics Itching      Medication List       Accurate as of Jan 23, 2021  9:07 AM. If you have any questions, ask your nurse or doctor.        amLODipine 5 MG tablet Commonly known as: NORVASC Take 1 tablet (5 mg total) by mouth daily. Please make overdue appt with Dr. Radford Pax before anymore refills. Thank you 2nd attempt   aspirin EC 81 MG tablet Take 81 mg by mouth daily.   Biotin 5000 MCG Caps Take 1 capsule by mouth daily.   calcium carbonate 1250 (500 Ca) MG tablet Commonly known as: OS-CAL - dosed in mg of elemental calcium Take 1 tablet by mouth.   Contour Next Test test strip Generic drug: glucose blood Use Bayer Contour test strips to check blood sugar 7 times daily.   ezetimibe-simvastatin 10-40 MG tablet Commonly known as: VYTORIN TAKE 1 TABLET BY MOUTH DAILY AT 6 PM   insulin lispro 100 UNIT/ML injection Commonly known as: HumaLOG Use max of 74 units under the skin daily via insulin pump.   Jardiance 25 MG Tabs tablet Generic drug: empagliflozin TAKE 1 TABLET BY MOUTH EVERY DAY   levothyroxine 112 MCG tablet Commonly known as: SYNTHROID TAKE 1 TABLET(112 MCG) BY MOUTH DAILY   nitroGLYCERIN 0.4 MG SL tablet Commonly known as: NITROSTAT Place 1 tablet (0.4 mg total) under the tongue every 5 (five) minutes as needed for chest pain.   olmesartan 20 MG tablet Commonly known as: BENICAR TAKE 1 TABLET(20 MG) BY MOUTH DAILY   omega-3 acid ethyl esters 1 g capsule Commonly known as: LOVAZA Take 2 g by mouth 2 (two) times daily.   Omnipod DASH PDM (Gen 4) Kit 1 each by Does not apply route. USE DAILY TO CONTINUOUSLY MONITOR BLOOD SUGAR.   Omnipod DASH Pods (Gen 4) Misc CHANGE DASH PODS EVERY 3 DAYS AS DIRECTED FOR INSULIN DELIVERY   PRESERVISION AREDS 2 PO Take by mouth.   Super B Complex/C Caps Take 1 tablet by mouth  daily.   Vitamin D3 10 MCG (400 UNIT) Caps Take by mouth.       Allergies:  Allergies  Allergen Reactions  . Actos [Pioglitazone]     hairloss  . Metformin And Related Diarrhea  . Novocain [Procaine]     syncopy  . Sulfa Antibiotics Itching    Past Medical History:  Diagnosis Date  . Arthritis   . Complication of anesthesia    takes along time to wake up  . Contact lens/glasses fitting    wears glasses or contacts  . Coronary artery disease 01/2007   s/p PCI of diagonal   . Diabetes mellitus without complication ([place])   . Dyslipidemia   . Hyperlipidemia   . Hypertension   . Hypothyroidism   . Snores   . Vertigo     Past Surgical History:  Procedure Laterality Date  . CARDIAC CATHETERIZATION  2008   stent  . SHOULDER ARTHROSCOPY WITH OPEN ROTATOR CUFF REPAIR Right 02/28/2013   Procedure: right shoulder arthroscopy, distal clavicle sculpting, open rotator cuff repair;  Surgeon: Cammie Sickle., MD;  Location: [facility];  Service: Orthopedics;  Laterality: Right;    Family History  Problem Relation Age of Onset  . Hypertension Father   . Heart attack Father     Social History:  reports that she has never smoked. She has never used smokeless tobacco. She reports current alcohol use. She reports that she does not use drugs.  Review of Systems:  HYPERTENSION:  followed by PCP/cardiologist, Also checks blood pressure at home Her drugs include Benicar 40 mg with amlodipine 5 mg  Last urine microalbumin is mildly increased again  BP Readings from Last 3 Encounters:  01/23/21 132/64  09/19/20 (!) 148/80  07/15/20 120/68   RENAL function: She had a high creatinine in October 2021 but this is now consistently normal  Previously was told to take only half a tablet of Benicar  Also is on Jardiance  Lab Results  Component Value Date   CREATININE 1.16 01/21/2021   CREATININE 1.16 09/16/2020   CREATININE 1.67 (H) 07/11/2020      HYPERLIPIDEMIA: The lipid abnormality consists of elevated LDL and triglycerides, prescribed Vytorin and Lovaza, followed by PCP and cardiologist.   She was tried on Crestor but she thinks this caused leg pain  Has mild increase in triglycerides and low normal HDL Last LDL was 124 as of 11/21 done by PCP    Lab Results  Component Value Date   CHOL 120 08/14/2019   HDL 40.20 08/14/2019   LDLCALC 43 08/14/2019   LDLDIRECT 130.0 01/18/2018   TRIG 186.0 (H) 08/14/2019   CHOLHDL 3 08/14/2019    History of mild hypothyroidism, treated for several years Her TSH was high in 5/19 when she had her last dosage increase  She is taking 112 mcg levothyroxine and her TSH is consistently normal No fatigue   Lab Results  Component Value Date   TSH 2.17 01/21/2021   TSH 3.78 09/16/2020   TSH 2.27 11/10/2019        Examination:   BP 132/64   Pulse 65   Ht _0  (1.702 m)   Wt 172 lb 12.8 oz (78.4 kg)   SpO2 98%   BMI 27.06 kg/m   Body mass index is 27.06 kg/m.     ASSESSMENT/ PLAN:   Diabetes type 2   See history of present illness for  discussion of current management, blood sugar patterns and problems identified  Her A1c is higher at 7.8 compared to 6.9  As discussed above she probably has high postprandial readings which she does not monitor Likely is having some inconsistency with her diet with portions or carbohydrates She is able to maintain her weight loss and exercise very regularly  She is reluctant to go back on insulin Also her  blood sugars are not consistently high relatively lower before lunch also Not using continuous monitoring  She will now start a GLP-1 drug, for weekly convenience she will start with a sample of Ozempic 0.25 mg  Explained to the patient what GLP-1 drugs are, the sites of actions and the body, reduction of hunger sensation and improved insulin secretion.  Discussed the benefit of weight loss with these medications. Explained  possible side effects of OZEMPIC, most commonly nausea that usually improves over time.  Also explained safety information associated with the medication Demonstrated the medication injection device to her  To start the injections with the 0.25 mg dosage weekly for the first 4 injections and then go up to 0.5 mg weekly if not achieving target blood sugars  Patient brochure on Ozempic and sample given   Microalbuminuria needs to be followed, currently blood pressure not unusually high  Hypothyroidism: Adequately replaced with 112 mcg levothyroxine  Hypertension: Well-controlled, she will continue to work with her cardiologist for this  History of high lipids: We will recheck on the next visit  There are no Patient Instructions on file for this visit.     Elayne Snare 01/23/2021, 9:07 AM

## 2021-01-23 NOTE — Patient Instructions (Addendum)
Check blood sugars on waking up days a week  Also check blood sugars about 2 hours after meals and do this after different meals by rotation  Recommended blood sugar levels on waking up are 90-130 and about 2 hours after meal is 130-160  Please bring your blood sugar monitor to each visit, thank you  Stay on Jardiance  Start OZEMPIC injections by dialing 0.25 mg on the pen as shown once weekly on the same day of the week.   You may inject in the sides of the stomach, outer thigh or arm as indicated in the brochure given. If you have any difficulties using the pen see the video at [employer].[email or web address]  You will feel fullness of the stomach with starting the medication and should try to keep the portions at meals small.  You may experience nausea in the first few days which usually gets better over time    After 4 weeks increase the dose to 0.5 mg weekly if sugars above targets Call for Rx  If you have any questions or persistent side effects please call the office   You may also talk to a nurse educator with [employer] at [phone] Useful website: [email or web address]

## 2021-03-09 ENCOUNTER — Other Ambulatory Visit: Payer: Self-pay | Admitting: Endocrinology

## 2021-03-27 DIAGNOSIS — M545 Low back pain, unspecified: Secondary | ICD-10-CM | POA: Diagnosis not present

## 2021-03-31 DIAGNOSIS — M545 Low back pain, unspecified: Secondary | ICD-10-CM | POA: Diagnosis not present

## 2021-04-02 DIAGNOSIS — R059 Cough, unspecified: Secondary | ICD-10-CM | POA: Diagnosis not present

## 2021-04-02 DIAGNOSIS — Z20822 Contact with and (suspected) exposure to covid-19: Secondary | ICD-10-CM | POA: Diagnosis not present

## 2021-04-02 DIAGNOSIS — R5383 Other fatigue: Secondary | ICD-10-CM | POA: Diagnosis not present

## 2021-04-02 DIAGNOSIS — R0981 Nasal congestion: Secondary | ICD-10-CM | POA: Diagnosis not present

## 2021-04-02 DIAGNOSIS — U071 COVID-19: Secondary | ICD-10-CM | POA: Diagnosis not present

## 2021-04-03 DIAGNOSIS — M545 Low back pain, unspecified: Secondary | ICD-10-CM | POA: Diagnosis not present

## 2021-04-15 ENCOUNTER — Other Ambulatory Visit (INDEPENDENT_AMBULATORY_CARE_PROVIDER_SITE_OTHER): Payer: Medicare Other

## 2021-04-15 ENCOUNTER — Other Ambulatory Visit: Payer: Self-pay

## 2021-04-15 DIAGNOSIS — E782 Mixed hyperlipidemia: Secondary | ICD-10-CM | POA: Diagnosis not present

## 2021-04-15 DIAGNOSIS — E1165 Type 2 diabetes mellitus with hyperglycemia: Secondary | ICD-10-CM | POA: Diagnosis not present

## 2021-04-15 LAB — LIPID PANEL
Cholesterol: 160 mg/dL (ref 0–200)
HDL: 38 mg/dL — ABNORMAL LOW (ref >39.00)
LDL Cholesterol: 89 mg/dL (ref 0–99)
NonHDL: 122.13
Total CHOL/HDL Ratio: 4
Triglycerides: 167 mg/dL — ABNORMAL HIGH (ref 0.0–149.0)
VLDL: 33.4 mg/dL (ref 0.0–40.0)

## 2021-04-15 LAB — BASIC METABOLIC PANEL
BUN: 24 mg/dL — ABNORMAL HIGH (ref 6–23)
CO2: 27 mEq/L (ref 19–32)
Calcium: 9.9 mg/dL (ref 8.4–10.5)
Chloride: 107 mEq/L (ref 96–112)
Creatinine, Ser: 1.14 mg/dL (ref 0.40–1.20)
GFR: 48.12 mL/min — ABNORMAL LOW (ref >60.00)
Glucose, Bld: 130 mg/dL — ABNORMAL HIGH (ref 70–99)
Potassium: 4.5 mEq/L (ref 3.5–5.1)
Sodium: 142 mEq/L (ref 135–145)

## 2021-04-15 LAB — HEMOGLOBIN A1C: Hgb A1c MFr Bld: 7.6 % — ABNORMAL HIGH (ref 4.6–6.5)

## 2021-04-17 ENCOUNTER — Ambulatory Visit (INDEPENDENT_AMBULATORY_CARE_PROVIDER_SITE_OTHER): Payer: Medicare Other | Admitting: Endocrinology

## 2021-04-17 ENCOUNTER — Other Ambulatory Visit: Payer: Self-pay

## 2021-04-17 ENCOUNTER — Encounter: Payer: Self-pay | Admitting: Endocrinology

## 2021-04-17 VITALS — BP 140/82 | HR 60 | Ht 67.0 in | Wt 173.6 lb

## 2021-04-17 DIAGNOSIS — R809 Proteinuria, unspecified: Secondary | ICD-10-CM

## 2021-04-17 DIAGNOSIS — E782 Mixed hyperlipidemia: Secondary | ICD-10-CM | POA: Diagnosis not present

## 2021-04-17 DIAGNOSIS — E063 Autoimmune thyroiditis: Secondary | ICD-10-CM

## 2021-04-17 DIAGNOSIS — E1129 Type 2 diabetes mellitus with other diabetic kidney complication: Secondary | ICD-10-CM | POA: Diagnosis not present

## 2021-04-17 DIAGNOSIS — E1165 Type 2 diabetes mellitus with hyperglycemia: Secondary | ICD-10-CM | POA: Diagnosis not present

## 2021-04-17 DIAGNOSIS — I10 Essential (primary) hypertension: Secondary | ICD-10-CM

## 2021-04-17 MED ORDER — OZEMPIC (0.25 OR 0.5 MG/DOSE) 2 MG/1.5ML ~~LOC~~ SOPN: 0.5000 mg | PEN_INJECTOR | SUBCUTANEOUS | 1 refills | Status: DC

## 2021-04-17 NOTE — Patient Instructions (Signed)
Check blood sugars on waking up 4  days a week  Also check blood sugars about 2 hours after meals and do this after different meals by rotation  Recommended blood sugar levels on waking up are 90-130 and about 2 hours after meal is 130-160  Please bring your blood sugar monitor to each visit, thank you  

## 2021-04-17 NOTE — Progress Notes (Signed)
Patient ID: Crystal Hobbs, female   DOB: 04-Jul-1951, 70 y.o.   MRN: [id number]   Reason for Appointment: Endocrinology follow-up    History of Present Illness   Diagnosis: Type 2 DIABETES MELITUS, date of diagnosis:  2011   Past history: Her initial presentation with diabetes was with significant symptoms and glucose of 947 Initially was treated with insulin and subsequently on metformin which she could not tolerate  Since she was not responding to Victoza and had significant hyperglycemia she was started on basal bolus insulin regimen instead  However had required relatively large doses of insulin with inadequate control and blood sugars subsequently improved significantly with adding Victoza in 10/13  Her  A1c was 6.4 in 4/14 and she had overall good control with minimal hypoglycemia She was taken off  Afrezza because of her high out-of-pocket expense and she was tried on V-go pump which she had some difficulties getting this through the pharmacy  Previous insulin settings: BASAL rates 1.6 midnight-7 AM, 7 AM = 1.2, 10 AM = 1.1, 2 PM = 1.2 and 7 PM = 1.6, total basal 33 units Carbohydrate coverage 1: 10.  Preset boluses 3-5 units at breakfast 4-5 at lunch and 6 units at dinner Correction 1: 50 with target 100-120  RECENT history:    Insulin regimen: None, previously on OMNIPOD insulin pump started on 08/16/2018  Non-insulin hypoglycemic drugs: Jardiance 25 mg daily  Her A1c is 7.6 compared to 7.8, previously 6.9   Current pump management, blood sugar patterns and problems identified: She has tried Ozempic with a sample on her last visit However because of communication issues she could not get a prescription to continue this and was only taking 0.25 mg weekly She does think that her blood sugar was somewhat better with Ozempic She has somewhat variable readings in the morning but still likely has high readings at night after dinner when she has checked only twice in the  last month  She is still mostly doing fairly well with her diet and weight has been maintained  Has been consistent with Jardiance Has only 1 recent reading after dinner that was high She is exercising quite regularly, walking and playing tennis  She has been reluctant to go back on insulin   Physical activity: exercise: Walking up to 3 miles a day or plays tennis 3/7 days a week,             Side effects from medications:  diarrhea from metformin ? Hair loss from Actos Proper timing of medications in relation to meals: Yes         Monitors blood glucose: Mostly once a day.  Glucometer:  Contour Next/One  .          Blood Glucose readings from download   PRE-MEAL Fasting Lunch Dinner Bedtime Overall  Glucose range: 112-172  90    Mean/median: 134    135   POST-MEAL PC Breakfast PC Lunch PC Dinner  Glucose range:  165 189, 187  Mean/median:     Previously  PRE-MEAL Fasting Lunch Dinner Bedtime Overall  Glucose range:  127-153    122, 177   Mean/median:  138  128   150  138           Diabetes education visit: Most recent: 05/2012      CDE visit 12/19        Wt Readings from Last 3 Encounters:  04/17/21 173 lb 9.6 oz (78.7 kg)  01/23/21 172 lb 12.8 oz (78.4 kg)  09/19/20 172 lb 6.4 oz (78.2 kg)    Lab Results  Component Value Date   HGBA1C 7.6 (H) 04/15/2021   HGBA1C 7.8 (H) 01/21/2021   HGBA1C 6.9 (H) 09/16/2020   Lab Results  Component Value Date   MICROALBUR 17.0 (H) 09/19/2020   LDLCALC 89 04/15/2021   CREATININE 1.14 04/15/2021    Other problems discussed today: See review of systems   Lab on 04/15/2021  Component Date Value Ref Range Status   Cholesterol 04/15/2021 160  0 - 200 mg/dL Final   ATP III Classification       Desirable:  < 200 mg/dL               Borderline High:  200 - 239 mg/dL          High:  > = 240 mg/dL   Triglycerides 04/15/2021 167.0 (A) 0.0 - 149.0 mg/dL Final   Normal:  <150 mg/dLBorderline High:  150 - 199 mg/dL   HDL  04/15/2021 38.00 (A) >39.00 mg/dL Final   VLDL 04/15/2021 33.4  0.0 - 40.0 mg/dL Final   LDL Cholesterol 04/15/2021 89  0 - 99 mg/dL Final   Total CHOL/HDL Ratio 04/15/2021 4   Final                  Men          Women1/2 Average Risk     3.4          3.3Average Risk          5.0          4.42X Average Risk          9.6          7.13X Average Risk          15.0          11.0                       NonHDL 04/15/2021 122.13   Final   NOTE:  Non-HDL goal should be 30 mg/dL higher than patient's LDL goal (i.e. LDL goal of < 70 mg/dL, would have non-HDL goal of < 100 mg/dL)   Sodium 04/15/2021 142  135 - 145 mEq/L Final   Potassium 04/15/2021 4.5  3.5 - 5.1 mEq/L Final   Chloride 04/15/2021 107  96 - 112 mEq/L Final   CO2 04/15/2021 27  19 - 32 mEq/L Final   Glucose, Bld 04/15/2021 130 (A) 70 - 99 mg/dL Final   BUN 04/15/2021 24 (A) 6 - 23 mg/dL Final   Creatinine, Ser 04/15/2021 1.14  0.40 - 1.20 mg/dL Final   GFR 04/15/2021 48.12 (A) >60.00 mL/min Final   Calculated using the CKD-EPI Creatinine Equation (2021)   Calcium 04/15/2021 9.9  8.4 - 10.5 mg/dL Final   Hgb A1c MFr Bld 04/15/2021 7.6 (A) 4.6 - 6.5 % Final   Glycemic Control Guidelines for People with Diabetes:Non Diabetic:  <6%Goal of Therapy: <7%Additional Action Suggested:  >8%     Allergies as of 04/17/2021       Reactions   Actos [pioglitazone]    hairloss   Metformin And Related Diarrhea   Novocain [procaine]    syncopy   Sulfa Antibiotics Itching        Medication List        Accurate as of April 17, 2021 10:46 AM. If you have any questions, ask your  nurse or doctor.          amLODipine 5 MG tablet Commonly known as: NORVASC Take 1 tablet (5 mg total) by mouth daily. Please make overdue appt with Dr. Radford Pax before anymore refills. Thank you 2nd attempt   aspirin EC 81 MG tablet Take 81 mg by mouth daily.   Biotin 5000 MCG Caps Take 1 capsule by mouth daily.   calcium carbonate 1250 (500 Ca) MG  tablet Commonly known as: OS-CAL - dosed in mg of elemental calcium Take 1 tablet by mouth.   Contour Next Test test strip Generic drug: glucose blood Use Bayer Contour test strips to check blood sugar 7 times daily.   ezetimibe-simvastatin 10-40 MG tablet Commonly known as: VYTORIN TAKE 1 TABLET BY MOUTH DAILY AT 6 PM   insulin lispro 100 UNIT/ML injection Commonly known as: HumaLOG Use max of 74 units under the skin daily via insulin pump.   Jardiance 25 MG Tabs tablet Generic drug: empagliflozin TAKE 1 TABLET BY MOUTH EVERY DAY   levothyroxine 112 MCG tablet Commonly known as: SYNTHROID TAKE 1 TABLET(112 MCG) BY MOUTH DAILY   nitroGLYCERIN 0.4 MG SL tablet Commonly known as: NITROSTAT Place 1 tablet (0.4 mg total) under the tongue every 5 (five) minutes as needed for chest pain.   olmesartan 20 MG tablet Commonly known as: BENICAR TAKE 1 TABLET(20 MG) BY MOUTH DAILY   omega-3 acid ethyl esters 1 g capsule Commonly known as: LOVAZA Take 2 g by mouth 2 (two) times daily.   Omnipod DASH PDM (Gen 4) Kit 1 each by Does not apply route. USE DAILY TO CONTINUOUSLY MONITOR BLOOD SUGAR.   Omnipod DASH Pods (Gen 4) Misc CHANGE DASH PODS EVERY 3 DAYS AS DIRECTED FOR INSULIN DELIVERY   Ozempic (0.25 or 0.5 MG/DOSE) 2 MG/1.5ML Sopn Generic drug: Semaglutide(0.25 or 0.5MG/DOS) Inject 0.25 mg into the skin once a week.   PRESERVISION AREDS 2 PO Take by mouth.   Super B Complex/C Caps Take 1 tablet by mouth daily.   Vitamin D3 10 MCG (400 UNIT) Caps Take by mouth.        Allergies:  Allergies  Allergen Reactions   Actos [Pioglitazone]     hairloss   Metformin And Related Diarrhea   Novocain [Procaine]     syncopy   Sulfa Antibiotics Itching    Past Medical History:  Diagnosis Date   Arthritis    Complication of anesthesia    takes along time to wake up   Contact lens/glasses fitting    wears glasses or contacts   Coronary artery disease 01/2007   s/p PCI  of diagonal    Diabetes mellitus without complication ([place])    Dyslipidemia    Hyperlipidemia    Hypertension    Hypothyroidism    Snores    Vertigo     Past Surgical History:  Procedure Laterality Date   CARDIAC CATHETERIZATION  2008   stent   SHOULDER ARTHROSCOPY WITH OPEN ROTATOR CUFF REPAIR Right 02/28/2013   Procedure: right shoulder arthroscopy, distal clavicle sculpting, open rotator cuff repair;  Surgeon: Cammie Sickle., MD;  Location: [facility];  Service: Orthopedics;  Laterality: Right;    Family History  Problem Relation Age of Onset   Hypertension Father    Heart attack Father     Social History:  reports that she has never smoked. She has never used smokeless tobacco. She reports current alcohol use. She reports that she does not use  drugs.  Review of Systems:  HYPERTENSION:  followed by PCP/cardiologist, Also checks blood pressure at home Her drugs include Benicar 40 mg with amlodipine 5 mg  Last urine microalbumin is mildly increased at 46  BP Readings from Last 3 Encounters:  04/17/21 140/82  01/23/21 132/64  09/19/20 (!) 148/80   RENAL function: She had a high creatinine in October 2021 but this is now consistently normal  She is on Jardiance  Lab Results  Component Value Date   CREATININE 1.14 04/15/2021   CREATININE 1.16 01/21/2021   CREATININE 1.16 09/16/2020     HYPERLIPIDEMIA: She has had elevated LDL and triglycerides, prescribed Vytorin and Lovaza, followed by PCP and cardiologist.   She was tried on Crestor but she thinks this caused leg pain  Has mild increase in triglycerides and low normal HDL  Lab Results  Component Value Date   CHOL 160 04/15/2021   CHOL 120 08/14/2019   CHOL 162 05/26/2018   Lab Results  Component Value Date   HDL 38.00 (L) 04/15/2021   HDL 40.20 08/14/2019   HDL 37.70 (L) 05/26/2018   Lab Results  Component Value Date   LDLCALC 89 04/15/2021   LDLCALC 43 08/14/2019   LDLCALC 89  05/26/2018   Lab Results  Component Value Date   TRIG 167.0 (H) 04/15/2021   TRIG 186.0 (H) 08/14/2019   TRIG 178.0 (H) 05/26/2018   Lab Results  Component Value Date   CHOLHDL 4 04/15/2021   CHOLHDL 3 08/14/2019   CHOLHDL 4 05/26/2018   Lab Results  Component Value Date   LDLDIRECT 130.0 01/18/2018   LDLDIRECT 121.2 05/08/2013     History of mild hypothyroidism, treated for several years Her TSH was high in 5/19 when she had her last dosage increase  She is taking 112 mcg levothyroxine and her TSH is consistently normal No fatigue   Lab Results  Component Value Date   TSH 2.17 01/21/2021   TSH 3.78 09/16/2020   TSH 2.27 11/10/2019        Examination:   BP 140/82   Pulse 60   Ht 5' 7"  (1.702 m)   Wt 173 lb 9.6 oz (78.7 kg)   SpO2 98%   BMI 27.19 kg/m   Body mass index is 27.19 kg/m.     ASSESSMENT/ PLAN:   Diabetes type 2   See history of present illness for  discussion of current management, blood sugar patterns and problems identified  Her A1c is still higher than before at 7.6  Although she may benefit from taking Ozempic as discussed above not clear if she will have consistent control of postprandial readings after dinner Hopefully however with increasing the dose progressively she will have better control and also able to keep portions and carbohydrates down consistently  She is able to maintain her weight loss with exercise as before  She will now start back on Ozempic but take 0.25 weekly dose only for 2 weeks and then go to 0.5, new prescription sent If she has any consistent control will go up to 1 mg as long as she does not have any nausea and this was discussed  Lipids: Well-controlled on Vytorin and Lovaza however risk reduction may be better with Vascepa compared to Lovaza However triglycerides and HDL can be still better  Microalbuminuria needs to be rechecked, may prove with better diabetes control  Hypothyroidism: Adequately  replaced with 112 mcg levothyroxine previously and will need follow-up  Hypertension: Managed by cardiologist  There are no Patient Instructions on file for this visit.     Elayne Snare 04/17/2021, 10:46 AM

## 2021-04-28 DIAGNOSIS — H43822 Vitreomacular adhesion, left eye: Secondary | ICD-10-CM | POA: Diagnosis not present

## 2021-04-28 DIAGNOSIS — H02831 Dermatochalasis of right upper eyelid: Secondary | ICD-10-CM | POA: Diagnosis not present

## 2021-04-28 DIAGNOSIS — H02834 Dermatochalasis of left upper eyelid: Secondary | ICD-10-CM | POA: Diagnosis not present

## 2021-04-28 DIAGNOSIS — E119 Type 2 diabetes mellitus without complications: Secondary | ICD-10-CM | POA: Diagnosis not present

## 2021-04-28 DIAGNOSIS — H2513 Age-related nuclear cataract, bilateral: Secondary | ICD-10-CM | POA: Diagnosis not present

## 2021-04-28 DIAGNOSIS — H04123 Dry eye syndrome of bilateral lacrimal glands: Secondary | ICD-10-CM | POA: Diagnosis not present

## 2021-04-28 LAB — HM DIABETES EYE EXAM

## 2021-04-30 DIAGNOSIS — M545 Low back pain, unspecified: Secondary | ICD-10-CM | POA: Diagnosis not present

## 2021-05-05 ENCOUNTER — Encounter: Payer: Self-pay | Admitting: Endocrinology

## 2021-05-12 DIAGNOSIS — M6281 Muscle weakness (generalized): Secondary | ICD-10-CM | POA: Diagnosis not present

## 2021-05-12 DIAGNOSIS — M5432 Sciatica, left side: Secondary | ICD-10-CM | POA: Diagnosis not present

## 2021-05-12 DIAGNOSIS — M5459 Other low back pain: Secondary | ICD-10-CM | POA: Diagnosis not present

## 2021-05-12 DIAGNOSIS — M5186 Other intervertebral disc disorders, lumbar region: Secondary | ICD-10-CM | POA: Diagnosis not present

## 2021-05-20 DIAGNOSIS — M6281 Muscle weakness (generalized): Secondary | ICD-10-CM | POA: Diagnosis not present

## 2021-05-20 DIAGNOSIS — M5432 Sciatica, left side: Secondary | ICD-10-CM | POA: Diagnosis not present

## 2021-05-20 DIAGNOSIS — M5459 Other low back pain: Secondary | ICD-10-CM | POA: Diagnosis not present

## 2021-05-20 DIAGNOSIS — M5186 Other intervertebral disc disorders, lumbar region: Secondary | ICD-10-CM | POA: Diagnosis not present

## 2021-07-08 ENCOUNTER — Other Ambulatory Visit: Payer: Self-pay

## 2021-07-08 ENCOUNTER — Other Ambulatory Visit (INDEPENDENT_AMBULATORY_CARE_PROVIDER_SITE_OTHER): Payer: Medicare Other

## 2021-07-08 ENCOUNTER — Other Ambulatory Visit: Payer: Medicare Other

## 2021-07-08 DIAGNOSIS — E1129 Type 2 diabetes mellitus with other diabetic kidney complication: Secondary | ICD-10-CM | POA: Diagnosis not present

## 2021-07-08 DIAGNOSIS — E063 Autoimmune thyroiditis: Secondary | ICD-10-CM

## 2021-07-08 DIAGNOSIS — E1165 Type 2 diabetes mellitus with hyperglycemia: Secondary | ICD-10-CM

## 2021-07-08 DIAGNOSIS — R809 Proteinuria, unspecified: Secondary | ICD-10-CM | POA: Diagnosis not present

## 2021-07-08 LAB — MICROALBUMIN / CREATININE URINE RATIO
Creatinine,U: 127.5 mg/dL
Microalb Creat Ratio: 50.9 mg/g — ABNORMAL HIGH (ref 0.0–30.0)
Microalb, Ur: 64.9 mg/dL — ABNORMAL HIGH (ref 0.0–1.9)

## 2021-07-08 LAB — TSH: TSH: 2.2 u[IU]/mL (ref 0.35–5.50)

## 2021-07-08 LAB — HEMOGLOBIN A1C: Hgb A1c MFr Bld: 7.5 % — ABNORMAL HIGH (ref 4.6–6.5)

## 2021-07-10 ENCOUNTER — Other Ambulatory Visit: Payer: Medicare Other

## 2021-07-10 ENCOUNTER — Other Ambulatory Visit: Payer: Self-pay

## 2021-07-10 ENCOUNTER — Other Ambulatory Visit: Payer: Self-pay | Admitting: Endocrinology

## 2021-07-10 DIAGNOSIS — E1165 Type 2 diabetes mellitus with hyperglycemia: Secondary | ICD-10-CM

## 2021-07-11 ENCOUNTER — Encounter: Payer: Self-pay | Admitting: Endocrinology

## 2021-07-11 ENCOUNTER — Other Ambulatory Visit: Payer: Self-pay

## 2021-07-11 ENCOUNTER — Ambulatory Visit: Payer: Medicare Other | Admitting: Endocrinology

## 2021-07-11 VITALS — BP 172/92 | HR 61 | Ht 66.5 in | Wt 170.2 lb

## 2021-07-11 DIAGNOSIS — E1165 Type 2 diabetes mellitus with hyperglycemia: Secondary | ICD-10-CM | POA: Diagnosis not present

## 2021-07-11 DIAGNOSIS — I10 Essential (primary) hypertension: Secondary | ICD-10-CM

## 2021-07-11 DIAGNOSIS — E063 Autoimmune thyroiditis: Secondary | ICD-10-CM | POA: Diagnosis not present

## 2021-07-11 DIAGNOSIS — Z23 Encounter for immunization: Secondary | ICD-10-CM

## 2021-07-11 NOTE — Progress Notes (Signed)
Patient ID: Crystal Hobbs, female   DOB: [birth year], 70 y.o.   MRN: [id number]   Reason for Appointment: Endocrinology follow-up    History of Present Illness   Diagnosis: Type 2 DIABETES MELITUS, date of diagnosis:  2011   Past history: Her initial presentation with diabetes was with significant symptoms and glucose of 947 Initially was treated with insulin and subsequently on metformin which she could not tolerate  Since she was not responding to Victoza and had significant hyperglycemia she was started on basal bolus insulin regimen instead  However had required relatively large doses of insulin with inadequate control and blood sugars subsequently improved significantly with adding Victoza in 10/13  Her  A1c was 6.4 in 4/14 and she had overall good control with minimal hypoglycemia She was taken off  Afrezza because of her high out-of-pocket expense and she was tried on V-go pump which she had some difficulties getting this through the pharmacy  Previous insulin settings: BASAL rates 1.6 midnight-7 AM, 7 AM = 1.2, 10 AM = 1.1, 2 PM = 1.2 and 7 PM = 1.6, total basal 33 units Carbohydrate coverage 1: 10.  Preset boluses 3-5 units at breakfast 4-5 at lunch and 6 units at dinner Correction 1: 50 with target 100-120  RECENT history:    Insulin regimen: None, previously on OMNIPOD insulin pump started on 08/16/2018  Non-insulin hypoglycemic drugs: Jardiance 0 mg daily, Ozempic 0.5 mg weekly  Her A1c is about the same at 7.5, previously 7.6, lowest reading previously 6.7  Current pump management, blood sugar patterns and problems identified: She has continued Ozempic although she was not able to get a prescription until early September as her pharmacy could not obtain her supply  She has gone up to 0.5 mg weekly without any side effects  However about 2 to 3 weeks ago she stopped taking the Jardiance because it was more expensive  She is also concerned about the cost of  Ozempic  Her meter is reading about 8 hours behind and difficult to analyze her readings on the monitor download, she also thinks that previously her meter was not reading accurately Has only 1 recent reading after dinner that was high otherwise postprandial readings are appearing to be fairly well controlled She is exercising as before and regularly, walking and playing tennis  She has been reluctant to go back on insulin   Physical activity: exercise: Walking up to 3 miles a day or plays tennis 3/7 days a week,             Side effects from medications:  diarrhea from metformin ? Hair loss from Actos Proper timing of medications in relation to meals: Yes         Monitors blood glucose: Mostly once a day.  Glucometer:  Contour Next-one  .          Blood Glucose readings from download   PRE-MEAL Fasting Lunch Dinner Bedtime Overall  Glucose range: 88-166      Mean/median: 125    123   POST-MEAL PC Breakfast PC Lunch PC Dinner  Glucose range:   80-181  Mean/median:   ?  119   Previously:  PRE-MEAL Fasting Lunch Dinner Bedtime Overall  Glucose range: 112-172  90    Mean/median: 134    135   POST-MEAL PC Breakfast PC Lunch PC Dinner  Glucose range:  165 189, 187  Mean/median:      Diabetes education visit: Most recent: 05/2012  CDE visit 12/19        Wt Readings from Last 3 Encounters:  07/11/21 170 lb 3.2 oz (77.2 kg)  04/17/21 173 lb 9.6 oz (78.7 kg)  01/23/21 172 lb 12.8 oz (78.4 kg)    Lab Results  Component Value Date   HGBA1C 7.5 (H) 07/08/2021   HGBA1C 7.6 (H) 04/15/2021   HGBA1C 7.8 (H) 01/21/2021   Lab Results  Component Value Date   MICROALBUR 64.9 (H) 07/08/2021   LDLCALC 89 04/15/2021   CREATININE 1.14 04/15/2021    Other problems discussed today: See review of systems   Lab on 07/08/2021  Component Date Value Ref Range Status   TSH 07/08/2021 2.20  0.35 - 5.50 uIU/mL Final   Microalb, Ur 07/08/2021 64.9 (A)  0.0 - 1.9 mg/dL Final    Creatinine,U 07/08/2021 127.5  mg/dL Final   Microalb Creat Ratio 07/08/2021 50.9 (A)  0.0 - 30.0 mg/g Final   Hgb A1c MFr Bld 07/08/2021 7.5 (A)  4.6 - 6.5 % Final   Glycemic Control Guidelines for People with Diabetes:Non Diabetic:  <6%Goal of Therapy: <7%Additional Action Suggested:  >8%     Allergies as of 07/11/2021       Reactions   Actos [pioglitazone]    hairloss   Metformin And Related Diarrhea   Novocain [procaine]    syncopy   Sulfa Antibiotics Itching        Medication List        Accurate as of July 11, 2021  9:29 AM. If you have any questions, ask your nurse or doctor.          amLODipine 5 MG tablet Commonly known as: NORVASC Take 1 tablet (5 mg total) by mouth daily. Please make overdue appt with Dr. Radford Pax before anymore refills. Thank you 2nd attempt   aspirin EC 81 MG tablet Take 81 mg by mouth daily. Patient is taking 4 pills daily   Biotin 5000 MCG Caps Take 1 capsule by mouth daily.   calcium carbonate 1250 (500 Ca) MG tablet Commonly known as: OS-CAL - dosed in mg of elemental calcium Take 1 tablet by mouth.   CINNAMON PO Take by mouth.   Contour Next Test test strip Generic drug: glucose blood Use Bayer Contour test strips to check blood sugar 7 times daily.   ezetimibe-simvastatin 10-40 MG tablet Commonly known as: VYTORIN TAKE 1 TABLET BY MOUTH DAILY AT 6 PM   Jardiance 25 MG Tabs tablet Generic drug: empagliflozin TAKE 1 TABLET BY MOUTH EVERY DAY   levothyroxine 112 MCG tablet Commonly known as: SYNTHROID TAKE 1 TABLET(112 MCG) BY MOUTH DAILY   nitroGLYCERIN 0.4 MG SL tablet Commonly known as: NITROSTAT Place 1 tablet (0.4 mg total) under the tongue every 5 (five) minutes as needed for chest pain.   olmesartan 20 MG tablet Commonly known as: BENICAR TAKE 1 TABLET(20 MG) BY MOUTH DAILY   omega-3 acid ethyl esters 1 g capsule Commonly known as: LOVAZA Take 2 g by mouth 2 (two) times daily.   Ozempic (0.25 or 0.5  MG/DOSE) 2 MG/1.5ML Sopn Generic drug: Semaglutide(0.25 or 0.5MG /DOS) Inject 0.5 mg into the skin once a week.   PRESERVISION AREDS 2 PO Take by mouth.   Super B Complex/C Caps Take 1 tablet by mouth daily.   Vitamin D3 10 MCG (400 UNIT) Caps Take by mouth.        Allergies:  Allergies  Allergen Reactions   Actos [Pioglitazone]     hairloss  Metformin And Related Diarrhea   Novocain [Procaine]     syncopy   Sulfa Antibiotics Itching    Past Medical History:  Diagnosis Date   Arthritis    Complication of anesthesia    takes along time to wake up   Contact lens/glasses fitting    wears glasses or contacts   Coronary artery disease 01/2007   s/p PCI of diagonal    Diabetes mellitus without complication ([place])    Dyslipidemia    Hyperlipidemia    Hypertension    Hypothyroidism    Snores    Vertigo     Past Surgical History:  Procedure Laterality Date   CARDIAC CATHETERIZATION  2008   stent   SHOULDER ARTHROSCOPY WITH OPEN ROTATOR CUFF REPAIR Right 02/28/2013   Procedure: right shoulder arthroscopy, distal clavicle sculpting, open rotator cuff repair;  Surgeon: Cammie Sickle., MD;  Location: [facility];  Service: Orthopedics;  Laterality: Right;    Family History  Problem Relation Age of Onset   Hypertension Father    Heart attack Father     Social History:  reports that she has never smoked. She has never used smokeless tobacco. She reports current alcohol use. She reports that she does not use drugs.  Review of Systems:  HYPERTENSION:  followed by PCP/cardiologist, Also checks blood pressure at home Sitting blood pressure is high today from taking Sudafed  Her drugs include Benicar 40 mg with amlodipine 5 mg  Her urine microalbumin is mildly increased at 50 again  BP Readings from Last 3 Encounters:  07/11/21 (!) 172/92  04/17/21 140/82  01/23/21 132/64   RENAL function: She had a high creatinine in October 2021 but this is  now consistently normal   Lab Results  Component Value Date   CREATININE 1.14 04/15/2021   CREATININE 1.16 01/21/2021   CREATININE 1.16 09/16/2020     HYPERLIPIDEMIA: She has had elevated LDL and triglycerides, prescribed Vytorin and Lovaza, followed by PCP and cardiologist.   She was tried on Crestor but she thinks this caused leg pain  Has mild increase in triglycerides and low normal HDL  Lab Results  Component Value Date   CHOL 160 04/15/2021   CHOL 120 08/14/2019   CHOL 162 05/26/2018   Lab Results  Component Value Date   HDL 38.00 (L) 04/15/2021   HDL 40.20 08/14/2019   HDL 37.70 (L) 05/26/2018   Lab Results  Component Value Date   LDLCALC 89 04/15/2021   LDLCALC 43 08/14/2019   LDLCALC 89 05/26/2018   Lab Results  Component Value Date   TRIG 167.0 (H) 04/15/2021   TRIG 186.0 (H) 08/14/2019   TRIG 178.0 (H) 05/26/2018   Lab Results  Component Value Date   CHOLHDL 4 04/15/2021   CHOLHDL 3 08/14/2019   CHOLHDL 4 05/26/2018   Lab Results  Component Value Date   LDLDIRECT 130.0 01/18/2018   LDLDIRECT 121.2 05/08/2013     History of mild hypothyroidism, treated for several years Her TSH was high in 5/19 when she had her last dosage increase  She is taking 112 mcg levothyroxine and her TSH is consistently normal She feels good  Lab Results  Component Value Date   TSH 2.20 07/08/2021   TSH 2.17 01/21/2021   TSH 3.78 09/16/2020        Examination:   BP (!) 172/92   Pulse 61   Ht 5' 6.5" (1.689 m)   Wt 170 lb 3.2 oz (77.2 kg)  SpO2 98%   BMI 27.06 kg/m   Body mass index is 27.06 kg/m.     ASSESSMENT/ PLAN:   Diabetes type 2   See history of present illness for  discussion of current management, blood sugar patterns and problems identified  Her A1c is still higher than before at 7.5  Not clear why she has not benefited from [place] with improved A1c but may not have been on the 0.5 mg long enough to see the benefits Her blood  sugars at home are being checked fairly regularly up to twice a day and has only rare high blood sugars  She is also getting better satiety from [place] Has lost 3 pounds Currently not taking Jardiance because she cannot afford this  Recommended that she at least for now continue the [place] and not stop it empirically even when her blood sugars are better She will try the 1 mg device and try to dial halfway to help her with the cost Hopefully when she is out of the donut hole she can start back on Jardiance Discussed the long-term benefits of Jardiance with reduced renal complications, possibly improving proteinuria which she has and cardiovascular risk reduction She will let us know if she has any consistently high blood sugars   Microalbuminuria is persistent, may benefit from adding Jardiance, currently likely unable to afford [place]  Hypothyroidism: Adequately replaced with 112 mcg levothyroxine  Hypertension: Managed by cardiologist, blood pressure is high from Sudafed and she will report any high readings at home to the cardiologist  Also needs to discuss her prescription for Vytorin with cardiologist who is prescribing this  Flu vaccine given  There are no Patient Instructions on file for this visit.     Elayne Snare 07/11/2021, 9:29 AM

## 2021-07-14 DIAGNOSIS — E1165 Type 2 diabetes mellitus with hyperglycemia: Secondary | ICD-10-CM

## 2021-07-16 MED ORDER — CONTOUR NEXT MONITOR W/DEVICE KIT: PACK | 0 refills | Status: DC

## 2021-07-21 ENCOUNTER — Other Ambulatory Visit: Payer: Self-pay | Admitting: Endocrinology

## 2021-07-22 DIAGNOSIS — Z0000 Encounter for general adult medical examination without abnormal findings: Secondary | ICD-10-CM | POA: Diagnosis not present

## 2021-07-22 DIAGNOSIS — I2510 Atherosclerotic heart disease of native coronary artery without angina pectoris: Secondary | ICD-10-CM | POA: Diagnosis not present

## 2021-07-22 DIAGNOSIS — I10 Essential (primary) hypertension: Secondary | ICD-10-CM | POA: Diagnosis not present

## 2021-07-22 DIAGNOSIS — E7800 Pure hypercholesterolemia, unspecified: Secondary | ICD-10-CM | POA: Diagnosis not present

## 2021-07-22 DIAGNOSIS — Z1211 Encounter for screening for malignant neoplasm of colon: Secondary | ICD-10-CM | POA: Diagnosis not present

## 2021-07-22 DIAGNOSIS — E039 Hypothyroidism, unspecified: Secondary | ICD-10-CM | POA: Diagnosis not present

## 2021-07-22 DIAGNOSIS — Z1389 Encounter for screening for other disorder: Secondary | ICD-10-CM | POA: Diagnosis not present

## 2021-07-22 DIAGNOSIS — R0683 Snoring: Secondary | ICD-10-CM | POA: Diagnosis not present

## 2021-07-22 DIAGNOSIS — E1140 Type 2 diabetes mellitus with diabetic neuropathy, unspecified: Secondary | ICD-10-CM | POA: Diagnosis not present

## 2021-08-04 MED ORDER — OZEMPIC (1 MG/DOSE) 4 MG/3ML ~~LOC~~ SOPN: 1.0000 mg | PEN_INJECTOR | SUBCUTANEOUS | 3 refills | Status: DC

## 2021-08-04 NOTE — Addendum Note (Signed)
Addended by: Cinda Quest on: 08/04/2021 11:35 AM   Modules accepted: Orders

## 2021-09-24 DIAGNOSIS — R0683 Snoring: Secondary | ICD-10-CM | POA: Insufficient documentation

## 2021-09-25 ENCOUNTER — Other Ambulatory Visit: Payer: Self-pay | Admitting: Endocrinology

## 2021-09-26 ENCOUNTER — Other Ambulatory Visit: Payer: Self-pay

## 2021-09-26 DIAGNOSIS — E1165 Type 2 diabetes mellitus with hyperglycemia: Secondary | ICD-10-CM

## 2021-09-26 MED ORDER — EMPAGLIFLOZIN 25 MG PO TABS
25.0000 mg | ORAL_TABLET | Freq: Every day | ORAL | 1 refills | Status: DC
Start: 2021-09-26 — End: 2022-06-15

## 2021-10-03 DIAGNOSIS — N3000 Acute cystitis without hematuria: Secondary | ICD-10-CM | POA: Diagnosis not present

## 2021-10-14 ENCOUNTER — Other Ambulatory Visit (HOSPITAL_BASED_OUTPATIENT_CLINIC_OR_DEPARTMENT_OTHER): Payer: Self-pay

## 2021-10-14 DIAGNOSIS — R0683 Snoring: Secondary | ICD-10-CM

## 2021-10-21 ENCOUNTER — Other Ambulatory Visit: Payer: Self-pay

## 2021-10-21 ENCOUNTER — Other Ambulatory Visit: Payer: Medicare Other

## 2021-10-21 ENCOUNTER — Other Ambulatory Visit (INDEPENDENT_AMBULATORY_CARE_PROVIDER_SITE_OTHER): Payer: Medicare Other

## 2021-10-21 DIAGNOSIS — E1165 Type 2 diabetes mellitus with hyperglycemia: Secondary | ICD-10-CM | POA: Diagnosis not present

## 2021-10-21 LAB — BASIC METABOLIC PANEL
BUN: 21 mg/dL (ref 6–23)
CO2: 32 mEq/L (ref 19–32)
Calcium: 10.4 mg/dL (ref 8.4–10.5)
Chloride: 102 mEq/L (ref 96–112)
Creatinine, Ser: 1.05 mg/dL (ref 0.40–1.20)
GFR: 52.91 mL/min — ABNORMAL LOW (ref >60.00)
Glucose, Bld: 145 mg/dL — ABNORMAL HIGH (ref 70–99)
Potassium: 4.5 mEq/L (ref 3.5–5.1)
Sodium: 139 mEq/L (ref 135–145)

## 2021-10-21 LAB — HEMOGLOBIN A1C: Hgb A1c MFr Bld: 7.7 % — ABNORMAL HIGH (ref 4.6–6.5)

## 2021-10-24 ENCOUNTER — Encounter: Payer: Self-pay | Admitting: Endocrinology

## 2021-10-24 ENCOUNTER — Other Ambulatory Visit: Payer: Self-pay

## 2021-10-24 ENCOUNTER — Ambulatory Visit: Payer: Medicare Other | Admitting: Endocrinology

## 2021-10-24 VITALS — BP 146/72 | HR 71 | Ht 66.5 in | Wt 170.2 lb

## 2021-10-24 DIAGNOSIS — E063 Autoimmune thyroiditis: Secondary | ICD-10-CM | POA: Diagnosis not present

## 2021-10-24 DIAGNOSIS — E1165 Type 2 diabetes mellitus with hyperglycemia: Secondary | ICD-10-CM

## 2021-10-24 MED ORDER — OZEMPIC (0.25 OR 0.5 MG/DOSE) 2 MG/1.5ML ~~LOC~~ SOPN: 0.5000 mg | PEN_INJECTOR | SUBCUTANEOUS | 2 refills | Status: DC

## 2021-10-24 NOTE — Patient Instructions (Addendum)
Check blood sugars on waking up 4 days a week  Also check blood sugars about 2 hours after meals and do this after different meals by rotation  Recommended blood sugar levels on waking up are 90-130 and about 2 hours after meal is 130-160  Please bring your blood sugar monitor to each visit, thank you  Ozempic 0.25mg  dose for 2x then 0.5 weekly

## 2021-10-24 NOTE — Progress Notes (Signed)
Patient ID: Crystal Hobbs, female   DOB: 1951/01/11, 71 y.o.   MRN: [id number]   Reason for Appointment: Endocrinology follow-up    History of Present Illness   Diagnosis: Type 2 DIABETES MELITUS, date of diagnosis:  2011   Past history: Her initial presentation with diabetes was with significant symptoms and glucose of 947 Initially was treated with insulin and subsequently on metformin which she could not tolerate  Since she was not responding to Victoza and had significant hyperglycemia she was started on basal bolus insulin regimen instead  However had required relatively large doses of insulin with inadequate control and blood sugars subsequently improved significantly with adding Victoza in 10/13  Her  A1c was 6.4 in 4/14 and she had overall good control with minimal hypoglycemia She was taken off  Afrezza because of her high out-of-pocket expense and she was tried on V-go pump which she had some difficulties getting this through the pharmacy  Previous insulin settings on OMNIPOD insulin pump started on 08/16/2018: BASAL rates 1.6 midnight-7 AM, 7 AM = 1.2, 10 AM = 1.1, 2 PM = 1.2 and 7 PM = 1.6, total basal 33 units Carbohydrate coverage 1: 10.  Preset boluses 3-5 units at breakfast 4-5 at lunch and 6 units at dinner Correction 1: 50 with target 100-120   RECENT history:    Non-insulin hypoglycemic drugs: Jardiance 25 mg daily, Ozempic 0.5 mg weekly  Her A1c is about the same at 7.7, lowest reading previously 6.7  Current pump management, blood sugar patterns and problems identified: She has not been able to get Ozempic recently for the last few weeks because of supply issues Also she has had some difficulty getting consistent refills on her Jardiance from the pharmacy She thinks her blood sugars are generally better when she is taking both together and currently only on Jardiance As before she has been able to maintain her weight with a generally good  diet POSTPRANDIAL readings recently have been under 180 although on as before checking most readings in the mornings and sporadically later in the day Previously had no side effects with Ozempic She is exercising although not as much as in summer, walking and playing tennis  Fasting readings are mildly increased compared to previous visit   Physical activity: exercise: Walking up to 3 miles a day or plays tennis 1-3/7 days a week,             Side effects from medications:  diarrhea from metformin ? Hair loss from Actos Proper timing of medications in relation to meals: Yes         Monitors blood glucose: Mostly once a day.  Glucometer:  Contour Next-one  .          Blood Glucose readings from download   PRE-MEAL Fasting Lunch Dinner Bedtime Overall  Glucose range: 123-162  102-115    Mean/median: 141    144   POST-MEAL PC Breakfast PC Lunch PC Dinner  Glucose range:  158 147-178  Mean/median:   164   Prior readings:   PRE-MEAL Fasting Lunch Dinner Bedtime Overall  Glucose range: 88-166      Mean/median: 125    123   POST-MEAL PC Breakfast PC Lunch PC Dinner  Glucose range:   80-181  Mean/median:   ?  119    Diabetes education visit: Most recent: 05/2012      CDE visit 12/19        Wt Readings from Last 3  Encounters:  10/24/21 170 lb 3.2 oz (77.2 kg)  07/11/21 170 lb 3.2 oz (77.2 kg)  04/17/21 173 lb 9.6 oz (78.7 kg)    Lab Results  Component Value Date   HGBA1C 7.7 (H) 10/21/2021   HGBA1C 7.5 (H) 07/08/2021   HGBA1C 7.6 (H) 04/15/2021   Lab Results  Component Value Date   MICROALBUR 64.9 (H) 07/08/2021   LDLCALC 89 04/15/2021   CREATININE 1.05 10/21/2021    Other problems discussed today: See review of systems   Lab on 10/21/2021  Component Date Value Ref Range Status   Sodium 10/21/2021 139  135 - 145 mEq/L Final   Potassium 10/21/2021 4.5  3.5 - 5.1 mEq/L Final   Chloride 10/21/2021 102  96 - 112 mEq/L Final   CO2 10/21/2021 32  19 - 32 mEq/L  Final   Glucose, Bld 10/21/2021 145 (H)  70 - 99 mg/dL Final   BUN 10/21/2021 21  6 - 23 mg/dL Final   Creatinine, Ser 10/21/2021 1.05  0.40 - 1.20 mg/dL Final   GFR 10/21/2021 52.91 (L)  >60.00 mL/min Final   Calculated using the CKD-EPI Creatinine Equation (2021)   Calcium 10/21/2021 10.4  8.4 - 10.5 mg/dL Final   Hgb A1c MFr Bld 10/21/2021 7.7 (H)  4.6 - 6.5 % Final   Glycemic Control Guidelines for People with Diabetes:Non Diabetic:  <6%Goal of Therapy: <7%Additional Action Suggested:  >8%     Allergies as of 10/24/2021       Reactions   Actos [pioglitazone]    hairloss   Metformin And Related Diarrhea   Novocain [procaine]    syncopy   Sulfa Antibiotics Itching        Medication List        Accurate as of October 24, 2021  4:14 PM. If you have any questions, ask your nurse or doctor.          amLODipine 5 MG tablet Commonly known as: NORVASC Take 1 tablet (5 mg total) by mouth daily. Please make overdue appt with Dr. Radford Pax before anymore refills. Thank you 2nd attempt   aspirin EC 81 MG tablet Take 81 mg by mouth daily. Patient is taking 4 pills daily   Biotin 5000 MCG Caps Take 1 capsule by mouth daily.   calcium carbonate 1250 (500 Ca) MG tablet Commonly known as: OS-CAL - dosed in mg of elemental calcium Take 1 tablet by mouth.   CINNAMON PO Take by mouth.   Contour Next Monitor w/Device Kit Use to check blood sugar daily.   Contour Next Test test strip Generic drug: glucose blood USE TO TEST BLOOD SUGAR 7 TIMES DAILY   empagliflozin 25 MG Tabs tablet Commonly known as: Jardiance Take 1 tablet (25 mg total) by mouth daily.   ezetimibe-simvastatin 10-40 MG tablet Commonly known as: VYTORIN TAKE 1 TABLET BY MOUTH DAILY AT 6 PM   levothyroxine 112 MCG tablet Commonly known as: SYNTHROID TAKE 1 TABLET(112 MCG) BY MOUTH DAILY   nitroGLYCERIN 0.4 MG SL tablet Commonly known as: NITROSTAT Place 1 tablet (0.4 mg total) under the tongue every 5  (five) minutes as needed for chest pain.   olmesartan 20 MG tablet Commonly known as: BENICAR TAKE 1 TABLET(20 MG) BY MOUTH DAILY   omega-3 acid ethyl esters 1 g capsule Commonly known as: LOVAZA Take 2 g by mouth 2 (two) times daily.   Ozempic (1 MG/DOSE) 4 MG/3ML Sopn Generic drug: Semaglutide (1 MG/DOSE) Inject 1 mg into the skin  once a week.   Super B Complex/C Caps Take 1 tablet by mouth daily.   Vitamin D3 10 MCG (400 UNIT) Caps Take by mouth.        Allergies:  Allergies  Allergen Reactions   Actos [Pioglitazone]     hairloss   Metformin And Related Diarrhea   Novocain [Procaine]     syncopy   Sulfa Antibiotics Itching    Past Medical History:  Diagnosis Date   Arthritis    Complication of anesthesia    takes along time to wake up   Contact lens/glasses fitting    wears glasses or contacts   Coronary artery disease 01/2007   s/p PCI of diagonal    Diabetes mellitus without complication ([place])    Dyslipidemia    Hyperlipidemia    Hypertension    Hypothyroidism    Snores    Vertigo     Past Surgical History:  Procedure Laterality Date   CARDIAC CATHETERIZATION  2008   stent   SHOULDER ARTHROSCOPY WITH OPEN ROTATOR CUFF REPAIR Right 02/28/2013   Procedure: right shoulder arthroscopy, distal clavicle sculpting, open rotator cuff repair;  Surgeon: Cammie Sickle., MD;  Location: [facility];  Service: Orthopedics;  Laterality: Right;    Family History  Problem Relation Age of Onset   Hypertension Father    Heart attack Father     Social History:  reports that she has never smoked. She has never used smokeless tobacco. She reports current alcohol use. She reports that she does not use drugs.  Review of Systems:  HYPERTENSION:  followed by PCP/cardiologist, Also checks blood pressure at home: Recently readings are about 135/75  Her drugs include Benicar 40 mg with amlodipine 5 mg Also prescribed by cardiologist  Her urine  microalbumin is mildly but persistently increased at 50   BP Readings from Last 3 Encounters:  10/24/21 (!) 146/72  07/11/21 (!) 172/92  04/17/21 140/82   RENAL function: Recent history:   Lab Results  Component Value Date   CREATININE 1.05 10/21/2021   CREATININE 1.14 04/15/2021   CREATININE 1.16 01/21/2021     HYPERLIPIDEMIA: She has had elevated LDL and triglycerides, prescribed Vytorin and Lovaza, followed by PCP and cardiologist.   She was tried on Crestor but she thinks this caused leg pain  Has mild increase in triglycerides and low normal HDL  Lab Results  Component Value Date   CHOL 160 04/15/2021   CHOL 120 08/14/2019   CHOL 162 05/26/2018   Lab Results  Component Value Date   HDL 38.00 (L) 04/15/2021   HDL 40.20 08/14/2019   HDL 37.70 (L) 05/26/2018   Lab Results  Component Value Date   LDLCALC 89 04/15/2021   LDLCALC 43 08/14/2019   LDLCALC 89 05/26/2018   Lab Results  Component Value Date   TRIG 167.0 (H) 04/15/2021   TRIG 186.0 (H) 08/14/2019   TRIG 178.0 (H) 05/26/2018   Lab Results  Component Value Date   CHOLHDL 4 04/15/2021   CHOLHDL 3 08/14/2019   CHOLHDL 4 05/26/2018   Lab Results  Component Value Date   LDLDIRECT 130.0 01/18/2018   LDLDIRECT 121.2 05/08/2013     History of mild hypothyroidism, treated for several years Her TSH was high in 5/19 when she had her last dosage increase  She is taking 112 mcg levothyroxine and her TSH is consistently normal She feels good  Lab Results  Component Value Date   TSH 2.20 07/08/2021  TSH 2.17 01/21/2021   TSH 3.78 09/16/2020        Examination:   BP (!) 146/72    Pulse 71    Ht 5' 6.5" (1.689 m)    Wt 170 lb 3.2 oz (77.2 kg)    SpO2 96%    BMI 27.06 kg/m   Body mass index is 27.06 kg/m.     ASSESSMENT/ PLAN:   Diabetes type 2   See history of present illness for  discussion of current management, blood sugar patterns and problems identified  Her A1c is still higher  than before at 7.7  She is supposed to be on Jardiance, Ozempic 0.5 mg  She has had difficulty getting Ozempic consistently and also Jardiance recently She can however afford these currently   She has mild increase in blood sugars at all times except sometimes before dinner Has been able to maintain a fairly good lifestyle and exercise  For now we will restart her Ozempic although will need to use 0.25 mg for the first 2 weeks at least Subsequently can try a half dose on the 1 mg pen   Patient Instructions  Check blood sugars on waking up 4 days a week  Also check blood sugars about 2 hours after meals and do this after different meals by rotation  Recommended blood sugar levels on waking up are 90-130 and about 2 hours after meal is 130-160  Please bring your blood sugar monitor to each visit, thank you  Ozempic 0.68m dose for 2x then 0.5 weekly     AElayne Snare2/06/2022, 4:14 PM

## 2021-11-17 ENCOUNTER — Encounter: Payer: Self-pay | Admitting: *Deleted

## 2021-11-18 ENCOUNTER — Encounter: Payer: Self-pay | Admitting: Neurology

## 2021-11-18 ENCOUNTER — Ambulatory Visit: Payer: Medicare Other | Admitting: Neurology

## 2021-11-18 VITALS — BP 151/82 | HR 69 | Ht 66.5 in | Wt 171.0 lb

## 2021-11-18 DIAGNOSIS — R0683 Snoring: Secondary | ICD-10-CM | POA: Diagnosis not present

## 2021-11-18 DIAGNOSIS — Z820 Family history of epilepsy and other diseases of the nervous system: Secondary | ICD-10-CM | POA: Diagnosis not present

## 2021-11-18 DIAGNOSIS — G4719 Other hypersomnia: Secondary | ICD-10-CM | POA: Diagnosis not present

## 2021-11-18 DIAGNOSIS — Z955 Presence of coronary angioplasty implant and graft: Secondary | ICD-10-CM

## 2021-11-18 DIAGNOSIS — E663 Overweight: Secondary | ICD-10-CM | POA: Diagnosis not present

## 2021-11-18 NOTE — Patient Instructions (Signed)

## 2021-11-18 NOTE — Progress Notes (Signed)
RM 8Subjective:    Patient ID: Crystal Hobbs Hobbs is a 71 y.o. female.  HPI    Crystal Hobbs Age, MD, PhD [facility] [address], [address] [place], [place] [address]  Dear Dr. Redmond Baseman,   I saw your patient, Crystal Hobbs Hobbs, upon your kind request in my sleep clinic today for initial consultation of her sleep disorder, in particular, concern for underlying obstructive sleep apnea.  The patient is unaccompanied today.  As you know, Crystal Hobbs Hobbs is a 71 year old right-handed woman with an underlying medical history of hypertension, hyperlipidemia, hypothyroidism, vertigo, coronary artery disease with status post stent placement in 2008, arthritis, allergies, and mildly overweight state, who reports snoring and some daytime somnolence, as well as a family history of sleep apnea.  Her son has a CPAP machine and her late brother also had sleep apnea and had a CPAP machine.  Her brother died of a heart attack.  I reviewed your office note from 09/24/2021.  Her Epworth sleepiness score is 8 out of 24, fatigue severity score is 34 out of 63.  Her snoring has fluctuated, lately it is a little better.  She was able to lose weight in 2021 in the realm of 23 pounds and has been able to keep it off.  She reports her snoring started becoming worse in 2020.  She denies night to night nocturia or recurrent morning headaches.  Bedtime is generally around midnight and rise time around 7:30 AM.  She has a TV in her bedroom but does not like to watch it in her bedroom, her husband prefers to have the TV on at night and when she wakes up in the middle of the night she will turn it off, it is typically not on the sleep timer.  She has had some trouble with her teeth.  She is working on getting 2 implants, she currently has 2 implants on the top and 2 bridges on the bottom.  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  She is married and lives with her  husband, he has sleep difficulty including sleep disruption and is a Educational psychologist, he is bothered by her snoring.  She drinks caffeine in the form of diet soda, about 2 bottles before lunch and also decaf diet soda later on.  She would be willing to consider a CPAP machine if she has sleep apnea.  She is working on better diabetes control her latest A1c was 7.7.  She is followed by endocrinology.  She used to be on insulin.  Her Past Medical History Is Significant For: Past Medical History:  Diagnosis Date   Allergy    Arthritis    Complication of anesthesia    takes along time to wake up   Contact lens/glasses fitting    wears glasses or contacts   Coronary artery disease 01/13/2007   s/p PCI of diagonal    Diabetes mellitus without complication ([place])    Dyslipidemia    Heart attack ([place])    had a blood clot, got a stent, but pt states she was told it was not from plaque   Hyperlipidemia    Hypertension    Hypothyroidism    Snores    Vertigo     Her Past Surgical History Is Significant For: Past Surgical History:  Procedure Laterality Date   CARDIAC CATHETERIZATION  09/14/2006   stent   COLONOSCOPY  rotator cuff repair Right    twice   SHOULDER ARTHROSCOPY WITH OPEN ROTATOR CUFF REPAIR Right 02/28/2013   Procedure: right shoulder arthroscopy, distal clavicle sculpting, open rotator cuff repair;  Surgeon: Cammie Sickle., MD;  Location: [facility];  Service: Orthopedics;  Laterality: Right;   TUBAL LIGATION      Her Family History Is Significant For: Family History  Problem Relation Hobbs of Onset   Lupus Mother    Hypertension Father    Heart attack Father    Sleep apnea Brother    Heart attack Other    Sleep apnea Son    Leukemia Maternal Aunt    Other Maternal Uncle        blood disorder    Her Social History Is Significant For: Social History   Socioeconomic History   Marital status: Married    Spouse name: Not on file   Number of  children: Not on file   Years of education: Not on file   Highest education level: Not on file  Occupational History   Not on file  Tobacco Use   Smoking status: Never   Smokeless tobacco: Never  Substance and Sexual Activity   Alcohol use: Yes    Comment: occ   Drug use: No   Sexual activity: Not on file  Other Topics Concern   Not on file  Social History Narrative   Caffeine: 2 cups/day (diet soda)   Social Determinants of Health   Financial Resource Strain: Not on file  Food Insecurity: Not on file  Transportation Needs: Not on file  Physical Activity: Not on file  Stress: Not on file  Social Connections: Not on file    Her Allergies Are:  Allergies  Allergen Reactions   Actos [Pioglitazone]     hairloss   Metformin And Related Diarrhea   Novocain [Procaine]     syncopy   Sulfa Antibiotics Itching  :   Her Current Medications Are:  Outpatient Encounter Medications as of 11/18/2021  Medication Sig   amLODipine (NORVASC) 5 MG tablet Take 1 tablet (5 mg total) by mouth daily. Please make overdue appt with Dr. Radford Pax before anymore refills. Thank you 2nd attempt   aspirin EC 81 MG tablet Take 81 mg by mouth daily. Patient is taking 4 pills daily   Biotin 5000 MCG CAPS Take 1 capsule by mouth daily.    Blood Glucose Monitoring Suppl (CONTOUR NEXT MONITOR) w/Device KIT Use to check blood sugar daily.   calcium carbonate (OS-CAL - DOSED IN MG OF ELEMENTAL CALCIUM) 1250 (500 Ca) MG tablet Take 1 tablet by mouth.   Cholecalciferol (VITAMIN D3) 10 MCG (400 UNIT) CAPS Take by mouth.   CINNAMON PO Take by mouth.   empagliflozin (JARDIANCE) 25 MG TABS tablet Take 1 tablet (25 mg total) by mouth daily.   ezetimibe-simvastatin (VYTORIN) 10-40 MG tablet TAKE 1 TABLET BY MOUTH DAILY AT 6 PM   glucose blood (CONTOUR NEXT TEST) test strip USE TO TEST BLOOD SUGAR 7 TIMES DAILY   levothyroxine (SYNTHROID) 112 MCG tablet TAKE 1 TABLET(112 MCG) BY MOUTH DAILY   nitroGLYCERIN  (NITROSTAT) 0.4 MG SL tablet Place 1 tablet (0.4 mg total) under the tongue every 5 (five) minutes as needed for chest pain.   olmesartan (BENICAR) 20 MG tablet TAKE 1 TABLET(20 MG) BY MOUTH DAILY   omega-3 acid ethyl esters (LOVAZA) 1 G capsule Take 2 g by mouth 2 (two) times daily.   Semaglutide,0.25 or 0.5MG/DOS, (  OZEMPIC, 0.25 OR 0.5 MG/DOSE,) 2 MG/1.5ML SOPN Inject 0.5 mg into the skin once a week.   SUPER B COMPLEX/C CAPS Take 1 tablet by mouth daily.    [DISCONTINUED] rosuvastatin (CRESTOR) 10 MG tablet Take 10 mg by mouth daily. (Patient not taking: Reported on 11/18/2021)   No facility-administered encounter medications on file as of 11/18/2021.  :   Review of Systems:  Out of a complete 14 point review of systems, all are reviewed and negative with the exception of these symptoms as listed below:  Review of Systems  Neurological:        Patient is here alone for a sleep consult. She states she snores a lot. She promised her husband she would see if there is anything she can do about it. Denies any daytime sleepiness. Her son has sleep apnea and wears a CPAP. Her brother had sleep apnea and wore a CPAP as well. ESS 8, FSS 34.    Objective:  Neurological Exam  Physical Exam Physical Examination:   Vitals:   11/18/21 1005  BP: (!) 151/82  Pulse: 69    General Examination: The patient is a very pleasant 71 y.o. female in no acute distress. She appears well-developed and well-nourished and well groomed.   HEENT: Normocephalic, atraumatic, pupils are equal, round and reactive to light, extraocular tracking is good without limitation to gaze excursion or nystagmus noted. Hearing is grossly intact. Face is symmetric with normal facial animation. Speech is clear with no dysarthria noted. There is no hypophonia. There is no lip, neck/head, jaw or voice tremor. Neck is supple with full range of passive and active motion. There are no carotid bruits on auscultation. Oropharynx exam reveals:  mild mouth dryness, adequate dental hygiene and mild airway crowding, due to small airway entry, Mallampati class III, tonsils on the smaller side.  She has 2 missing teeth in the lower right front, she reports a fall and about a year after she lost her teeth.  Tongue protrudes centrally and palate elevates symmetrically, neck circumference of 15-1/8 inches.  Nasal inspection reveals narrow nasal passages bilaterally.    Chest: Clear to auscultation without wheezing, rhonchi or crackles noted.  Heart: S1+S2+0, regular and normal without murmurs, rubs or gallops noted.   Abdomen: Soft, non-tender and non-distended.  Extremities: There is no pitting edema in the distal lower extremities bilaterally.   Skin: Warm and dry without trophic changes noted.   Musculoskeletal: exam reveals no obvious joint deformities, tenderness or joint swelling or erythema.   Neurologically:  Mental status: The patient is awake, alert and oriented in all 4 spheres. Her immediate and remote memory, attention, language skills and fund of knowledge are appropriate. There is no evidence of aphasia, agnosia, apraxia or anomia. Speech is clear with normal prosody and enunciation. Thought process is linear. Mood is normal and affect is normal.  Cranial nerves II - XII are as described above under HEENT exam.  Motor exam: Normal bulk, strength and tone is noted. There is no tremor. Fine motor skills and coordination: grossly intact.  Cerebellar testing: No dysmetria or intention tremor. There is no truncal or gait ataxia.  Sensory exam: intact to light touch in the upper and lower extremities.  Gait, station and balance: She stands easily. No veering to one side is noted. No leaning to one side is noted. Posture is Hobbs-appropriate and stance is narrow based. Gait shows normal stride length and normal pace. No problems turning are noted.   Assessment and  Plan:  In summary, QUANIYA DAMAS is a very pleasant 71 y.o.-year old  female with an underlying medical history of hypertension, hyperlipidemia, hypothyroidism, vertigo, coronary artery disease with status post stent placement in 2008, arthritis, allergies, and mildly overweight state, whose history and physical exam are concerning for obstructive sleep apnea (OSA). I had a long chat with the patient about my findings and the diagnosis of OSA, its prognosis and treatment options. We talked about medical treatments, surgical interventions and non-pharmacological approaches. I explained in particular the risks and ramifications of untreated moderate to severe OSA, especially with respect to developing cardiovascular disease down the Road, including congestive heart failure, difficult to treat hypertension, cardiac arrhythmias, or stroke. Even type 2 diabetes has, in part, been linked to untreated OSA. Symptoms of untreated OSA include daytime sleepiness, memory problems, mood irritability and mood disorder such as depression and anxiety, lack of energy, as well as recurrent headaches, especially morning headaches. We talked about trying to maintain a healthy lifestyle in general, as well as the importance of weight control. We also talked about the importance of good sleep hygiene. I recommended the following at this time: sleep study.  I outlined the differences between a laboratory attended sleep study versus home sleep test.  I explained the sleep test procedure to the patient and also outlined possible surgical and non-surgical treatment options of OSA, including the use of a custom-made dental device (which would require a referral to a specialist dentist or oral surgeon), upper airway surgical options, such as traditional UPPP or a novel less invasive surgical option in the form of Inspire hypoglossal nerve stimulation (which would involve a referral to an ENT surgeon). I also explained the CPAP treatment option to the patient, who indicated that she would be willing to try  CPAP if the need arises. I explained the importance of being compliant with PAP treatment, not only for insurance purposes but primarily to improve Her symptoms, and for the patient's long term health benefit, including to reduce Her cardiovascular risks. I answered all her questions today and the patient was in agreement. I plan to see her back after the sleep study is completed and encouraged her to call with any interim questions, concerns, problems or updates.   Thank you very much for allowing me to participate in the care of this nice patient. If I can be of any further assistance to you please do not hesitate to call me at [phone].  Sincerely,   Crystal Hobbs Age, MD, PhD

## 2021-11-19 DIAGNOSIS — Z1231 Encounter for screening mammogram for malignant neoplasm of breast: Secondary | ICD-10-CM | POA: Diagnosis not present

## 2021-11-24 ENCOUNTER — Telehealth: Payer: Self-pay

## 2021-11-24 NOTE — Telephone Encounter (Signed)
LVM for pt to call me back to schedule sleep study  

## 2021-12-07 ENCOUNTER — Telehealth: Payer: Self-pay

## 2021-12-07 NOTE — Telephone Encounter (Signed)
Please call pt on Monday morning to reschedule from 3/26. Tech called out sick. Tuesday is not available for pt.  ?

## 2021-12-15 ENCOUNTER — Telehealth: Payer: Self-pay

## 2021-12-15 NOTE — Telephone Encounter (Signed)
Pt will call back to r/s sleep study ?

## 2021-12-31 ENCOUNTER — Other Ambulatory Visit: Payer: Self-pay

## 2021-12-31 DIAGNOSIS — E1165 Type 2 diabetes mellitus with hyperglycemia: Secondary | ICD-10-CM

## 2021-12-31 MED ORDER — OZEMPIC (0.25 OR 0.5 MG/DOSE) 2 MG/1.5ML ~~LOC~~ SOPN: 0.5000 mg | PEN_INJECTOR | SUBCUTANEOUS | 2 refills | Status: DC

## 2021-12-31 NOTE — Telephone Encounter (Signed)
Wrong patient

## 2022-01-27 ENCOUNTER — Other Ambulatory Visit (INDEPENDENT_AMBULATORY_CARE_PROVIDER_SITE_OTHER): Payer: Medicare Other

## 2022-01-27 DIAGNOSIS — E063 Autoimmune thyroiditis: Secondary | ICD-10-CM | POA: Diagnosis not present

## 2022-01-27 DIAGNOSIS — E1165 Type 2 diabetes mellitus with hyperglycemia: Secondary | ICD-10-CM | POA: Diagnosis not present

## 2022-01-27 LAB — BASIC METABOLIC PANEL
BUN: 24 mg/dL — ABNORMAL HIGH (ref 6–23)
CO2: 25 mEq/L (ref 19–32)
Calcium: 10.5 mg/dL (ref 8.4–10.5)
Chloride: 109 mEq/L (ref 96–112)
Creatinine, Ser: 1.07 mg/dL (ref 0.40–1.20)
GFR: 51.63 mL/min — ABNORMAL LOW (ref >60.00)
Glucose, Bld: 115 mg/dL — ABNORMAL HIGH (ref 70–99)
Potassium: 4.4 mEq/L (ref 3.5–5.1)
Sodium: 142 mEq/L (ref 135–145)

## 2022-01-27 LAB — TSH: TSH: 1.11 u[IU]/mL (ref 0.35–5.50)

## 2022-01-27 LAB — T4, FREE: Free T4: 1.33 ng/dL (ref 0.60–1.60)

## 2022-01-27 LAB — MICROALBUMIN / CREATININE URINE RATIO
Creatinine,U: 58.9 mg/dL
Microalb Creat Ratio: 24.4 mg/g (ref 0.0–30.0)
Microalb, Ur: 14.4 mg/dL — ABNORMAL HIGH (ref 0.0–1.9)

## 2022-01-27 LAB — HEMOGLOBIN A1C: Hgb A1c MFr Bld: 7.1 % — ABNORMAL HIGH (ref 4.6–6.5)

## 2022-01-29 ENCOUNTER — Ambulatory Visit: Payer: Medicare Other | Admitting: Endocrinology

## 2022-01-29 ENCOUNTER — Encounter: Payer: Self-pay | Admitting: Endocrinology

## 2022-01-29 VITALS — BP 140/76 | HR 67 | Ht 66.5 in | Wt 170.2 lb

## 2022-01-29 DIAGNOSIS — E063 Autoimmune thyroiditis: Secondary | ICD-10-CM | POA: Diagnosis not present

## 2022-01-29 DIAGNOSIS — R809 Proteinuria, unspecified: Secondary | ICD-10-CM

## 2022-01-29 DIAGNOSIS — E1129 Type 2 diabetes mellitus with other diabetic kidney complication: Secondary | ICD-10-CM | POA: Diagnosis not present

## 2022-01-29 DIAGNOSIS — E1165 Type 2 diabetes mellitus with hyperglycemia: Secondary | ICD-10-CM | POA: Diagnosis not present

## 2022-01-29 MED ORDER — FLUCONAZOLE 150 MG PO TABS: 150.0000 mg | ORAL_TABLET | Freq: Once | ORAL | 1 refills | Status: AC

## 2022-01-29 NOTE — Progress Notes (Signed)
Patient ID: Crystal Hobbs, female   DOB: [birth year], 71 y.o.   MRN: [id number]   Reason for Appointment: Endocrinology follow-up    History of Present Illness   Diagnosis: Type 2 DIABETES MELITUS, date of diagnosis:  2011   Past history: Her initial presentation with diabetes was with significant symptoms and glucose of 947 Initially was treated with insulin and subsequently on metformin which she could not tolerate  Since she was not responding to Victoza and had significant hyperglycemia she was started on basal bolus insulin regimen instead  However had required relatively large doses of insulin with inadequate control and blood sugars subsequently improved significantly with adding Victoza in 10/13  Her  A1c was 6.4 in 4/14 and she had overall good control with minimal hypoglycemia She was taken off  Afrezza because of her high out-of-pocket expense and she was tried on V-go pump which she had some difficulties getting this through the pharmacy  Previous insulin settings on OMNIPOD insulin pump started on 08/16/2018: BASAL rates 1.6 midnight-7 AM, 7 AM = 1.2, 10 AM = 1.1, 2 PM = 1.2 and 7 PM = 1.6, total basal 33 units Carbohydrate coverage 1: 10.  Preset boluses 3-5 units at breakfast 4-5 at lunch and 6 units at dinner Correction 1: 50 with target 100-120   RECENT history:    Non-insulin hypoglycemic drugs: Jardiance 25 mg daily, Ozempic 0.5 mg weekly  Her A1c is significantly better at 7.1 compared to 7.7  However, lowest reading previously 6.7  Current pump management, blood sugar patterns and problems identified: She has been able to get Ozempic more consistently at the pharmacy  Also she has been taking her Jardiance regularly also  Except for one episode where her blood sugars were over 200 in the afternoon for a couple of days for unknown reason her blood sugars are generally well controlled  Currently she is able to exercise more consistently with tennis and  walking  Weight is about the same She is reporting persistent yeast infections with Jardiance for the last couple of months, previously had not had as much difficulty and is only using topical treatments Generally still trying to control her carbohydrate intake at meals and snacks Her blood sugars are overall better at all times compared to her last visit as below   Physical activity: exercise: Walking up to 3 miles a day or plays tennis 1-3/7 days a week,             Side effects from medications:  diarrhea from metformin ? Hair loss from Actos Proper timing of medications in relation to meals: Yes         Monitors blood glucose: Mostly once a day.  Glucometer:  Contour Next-one  .          Blood Glucose readings from download   PRE-MEAL Fasting Lunch Evening Bedtime Overall  Glucose range:     73-298  Mean/median: 127 136 131     POST-MEAL PC Breakfast PC Lunch PC Dinner  Glucose range:     Mean/median:   127   Previously:  PRE-MEAL Fasting Lunch Dinner Bedtime Overall  Glucose range: 123-162  102-115    Mean/median: 141    144   POST-MEAL PC Breakfast PC Lunch PC Dinner  Glucose range:  158 147-178  Mean/median:   164    Diabetes education visit: Most recent: 05/2012      CDE visit 12/19  Wt Readings from Last 3 Encounters:  01/29/22 170 lb 3.2 oz (77.2 kg)  11/18/21 171 lb (77.6 kg)  10/24/21 170 lb 3.2 oz (77.2 kg)    Lab Results  Component Value Date   HGBA1C 7.1 (H) 01/27/2022   HGBA1C 7.7 (H) 10/21/2021   HGBA1C 7.5 (H) 07/08/2021   Lab Results  Component Value Date   MICROALBUR 14.4 (H) 01/27/2022   LDLCALC 89 04/15/2021   CREATININE 1.07 01/27/2022    Other problems discussed today: See review of systems   Lab on 01/27/2022  Component Date Value Ref Range Status   Free T4 01/27/2022 1.33  0.60 - 1.60 ng/dL Final   Comment: Specimens from patients who are undergoing biotin therapy and /or ingesting biotin supplements may contain high  levels of biotin.  The higher biotin concentration in these specimens interferes with this Free T4 assay.  Specimens that contain high levels  of biotin may cause false high results for this Free T4 assay.  Please interpret results in light of the total clinical presentation of the patient.     TSH 01/27/2022 1.11  0.35 - 5.50 uIU/mL Final   Microalb, Ur 01/27/2022 14.4 (H)  0.0 - 1.9 mg/dL Final   Creatinine,U 01/27/2022 58.9  mg/dL Final   Microalb Creat Ratio 01/27/2022 24.4  0.0 - 30.0 mg/g Final   Sodium 01/27/2022 142  135 - 145 mEq/L Final   Potassium 01/27/2022 4.4  3.5 - 5.1 mEq/L Final   Chloride 01/27/2022 109  96 - 112 mEq/L Final   CO2 01/27/2022 25  19 - 32 mEq/L Final   Glucose, Bld 01/27/2022 115 (H)  70 - 99 mg/dL Final   BUN 01/27/2022 24 (H)  6 - 23 mg/dL Final   Creatinine, Ser 01/27/2022 1.07  0.40 - 1.20 mg/dL Final   GFR 01/27/2022 51.63 (L)  >60.00 mL/min Final   Calculated using the CKD-EPI Creatinine Equation (2021)   Calcium 01/27/2022 10.5  8.4 - 10.5 mg/dL Final   Hgb A1c MFr Bld 01/27/2022 7.1 (H)  4.6 - 6.5 % Final   Glycemic Control Guidelines for People with Diabetes:Non Diabetic:  <6%Goal of Therapy: <7%Additional Action Suggested:  >8%     Allergies as of 01/29/2022       Reactions   Actos [pioglitazone]    hairloss   Metformin And Related Diarrhea   Novocain [procaine]    syncopy   Sulfa Antibiotics Itching        Medication List        Accurate as of Jan 29, 2022 10:33 AM. If you have any questions, ask your nurse or doctor.          amLODipine 5 MG tablet Commonly known as: NORVASC Take 1 tablet (5 mg total) by mouth daily. Please make overdue appt with Dr. Radford Pax before anymore refills. Thank you 2nd attempt   aspirin EC 81 MG tablet Take 81 mg by mouth daily. Patient is taking 4 pills daily   Biotin 5000 MCG Caps Take 1 capsule by mouth daily.   calcium carbonate 1250 (500 Ca) MG tablet Commonly known as: OS-CAL - dosed in  mg of elemental calcium Take 1 tablet by mouth.   CINNAMON PO Take by mouth.   Contour Next Monitor w/Device Kit Use to check blood sugar daily.   Contour Next Test test strip Generic drug: glucose blood USE TO TEST BLOOD SUGAR 7 TIMES DAILY   empagliflozin 25 MG Tabs tablet Commonly known as: Jardiance Take  1 tablet (25 mg total) by mouth daily.   ezetimibe-simvastatin 10-40 MG tablet Commonly known as: VYTORIN TAKE 1 TABLET BY MOUTH DAILY AT 6 PM   levothyroxine 112 MCG tablet Commonly known as: SYNTHROID TAKE 1 TABLET(112 MCG) BY MOUTH DAILY   nitroGLYCERIN 0.4 MG SL tablet Commonly known as: NITROSTAT Place 1 tablet (0.4 mg total) under the tongue every 5 (five) minutes as needed for chest pain.   olmesartan 20 MG tablet Commonly known as: BENICAR TAKE 1 TABLET(20 MG) BY MOUTH DAILY   omega-3 acid ethyl esters 1 g capsule Commonly known as: LOVAZA Take 2 g by mouth 2 (two) times daily.   Ozempic (0.25 or 0.5 MG/DOSE) 2 MG/1.5ML Sopn Generic drug: Semaglutide(0.25 or 0.5MG/DOS) Inject 0.5 mg into the skin once a week.   Super B Complex/C Caps Take 1 tablet by mouth daily.   Vitamin D3 10 MCG (400 UNIT) Caps Take by mouth.        Allergies:  Allergies  Allergen Reactions   Actos [Pioglitazone]     hairloss   Metformin And Related Diarrhea   Novocain [Procaine]     syncopy   Sulfa Antibiotics Itching    Past Medical History:  Diagnosis Date   Allergy    Arthritis    Complication of anesthesia    takes along time to wake up   Contact lens/glasses fitting    wears glasses or contacts   Coronary artery disease 01/13/2007   s/p PCI of diagonal    Diabetes mellitus without complication (HCC)    Dyslipidemia    Heart attack ([place])    had a blood clot, got a stent, but pt states she was told it was not from plaque   Hyperlipidemia    Hypertension    Hypothyroidism    Snores    Vertigo     Past Surgical History:  Procedure Laterality Date    CARDIAC CATHETERIZATION  09/14/2006   stent   COLONOSCOPY     rotator cuff repair Right    twice   SHOULDER ARTHROSCOPY WITH OPEN ROTATOR CUFF REPAIR Right 02/28/2013   Procedure: right shoulder arthroscopy, distal clavicle sculpting, open rotator cuff repair;  Surgeon: Cammie Sickle., MD;  Location: [facility];  Service: Orthopedics;  Laterality: Right;   TUBAL LIGATION      Family History  Problem Relation Age of Onset   Lupus Mother    Hypertension Father    Heart attack Father    Sleep apnea Brother    Heart attack Other    Sleep apnea Son    Leukemia Maternal Aunt    Other Maternal Uncle        blood disorder    Social History:  reports that she has never smoked. She has never used smokeless tobacco. She reports current alcohol use. She reports that she does not use drugs.  Review of Systems:  HYPERTENSION:  followed by PCP/cardiologist, Also checks blood pressure at home: No high readings lately  Her drugs include Benicar 40 mg with amlodipine 5 mg Also prescribed by cardiologist  Her urine microalbumin is back to normal now  BP Readings from Last 3 Encounters:  01/29/22 140/76  11/18/21 (!) 151/82  10/24/21 (!) 146/72   RENAL function: Recent history:   Lab Results  Component Value Date   CREATININE 1.07 01/27/2022   CREATININE 1.05 10/21/2021   CREATININE 1.14 04/15/2021     HYPERLIPIDEMIA: She has had elevated LDL and triglycerides, prescribed  Vytorin and Lovaza, followed by PCP and cardiologist.   She was tried on Crestor but she thinks this caused leg pain  Has mild increase in triglycerides and low normal HDL  Lab Results  Component Value Date   CHOL 160 04/15/2021   CHOL 120 08/14/2019   CHOL 162 05/26/2018   Lab Results  Component Value Date   HDL 38.00 (L) 04/15/2021   HDL 40.20 08/14/2019   HDL 37.70 (L) 05/26/2018   Lab Results  Component Value Date   LDLCALC 89 04/15/2021   LDLCALC 43 08/14/2019   LDLCALC 89  05/26/2018   Lab Results  Component Value Date   TRIG 167.0 (H) 04/15/2021   TRIG 186.0 (H) 08/14/2019   TRIG 178.0 (H) 05/26/2018   Lab Results  Component Value Date   CHOLHDL 4 04/15/2021   CHOLHDL 3 08/14/2019   CHOLHDL 4 05/26/2018   Lab Results  Component Value Date   LDLDIRECT 130.0 01/18/2018   LDLDIRECT 121.2 05/08/2013     History of mild hypothyroidism, treated for several years Her TSH was high in 5/19 when she had her last dosage increase  She is taking 112 mcg levothyroxine and her TSH is consistently normal She feels good  Lab Results  Component Value Date   TSH 1.11 01/27/2022   TSH 2.20 07/08/2021   TSH 2.17 01/21/2021        Examination:   BP 140/76   Pulse 67   Ht 5' 6.5" (1.689 m)   Wt 170 lb 3.2 oz (77.2 kg)   SpO2 98%   BMI 27.06 kg/m   Body mass index is 27.06 kg/m.     ASSESSMENT/ PLAN:   Diabetes type 2   See history of present illness for  discussion of current management, blood sugar patterns and problems identified  Her A1c is back on track at 7.1   She is continuing to be on Jardiance, Ozempic 0.5 mg  With using the 1 mg Ozempic pen and a half if she is able to afford this She has also done well with her exercise regimen and diet Blood sugars are generally fairly evenly controlled throughout the day although still mildly increased on waking up  She will continue the same regimen For her candidiasis she will be given Diflucan If the problem persist may try half tablet of Jardiance  HYPOTHYROIDISM: This is very consistently controlled with 112 mcg of levothyroxine and will continue  MICROALBUMINURIA: This appears to be resolved with consistent diabetes control and continuing Benicar for hypertension   There are no Patient Instructions on file for this visit.     Elayne Snare 01/29/2022, 10:33 AM

## 2022-02-17 DIAGNOSIS — Z86010 Personal history of colonic polyps: Secondary | ICD-10-CM | POA: Diagnosis not present

## 2022-02-17 DIAGNOSIS — Z09 Encounter for follow-up examination after completed treatment for conditions other than malignant neoplasm: Secondary | ICD-10-CM | POA: Diagnosis not present

## 2022-02-17 DIAGNOSIS — K648 Other hemorrhoids: Secondary | ICD-10-CM | POA: Diagnosis not present

## 2022-04-14 ENCOUNTER — Other Ambulatory Visit: Payer: Self-pay

## 2022-04-14 DIAGNOSIS — E1165 Type 2 diabetes mellitus with hyperglycemia: Secondary | ICD-10-CM

## 2022-04-14 MED ORDER — SEMAGLUTIDE(0.25 OR 0.5MG/DOS) 2 MG/3ML ~~LOC~~ SOPN: PEN_INJECTOR | SUBCUTANEOUS | 2 refills | Status: DC

## 2022-04-17 ENCOUNTER — Telehealth: Payer: Self-pay | Admitting: Dietician

## 2022-04-17 NOTE — Telephone Encounter (Signed)
Patient left a message stating the the Ozempic prescription was not available at the pharmacy and was told we may have some here at the office.  Called patient who was not available. Left message for patient to return our call.  Antonieta Iba, RD, LDN, CDCES

## 2022-04-30 DIAGNOSIS — H1045 Other chronic allergic conjunctivitis: Secondary | ICD-10-CM | POA: Diagnosis not present

## 2022-04-30 DIAGNOSIS — H02831 Dermatochalasis of right upper eyelid: Secondary | ICD-10-CM | POA: Diagnosis not present

## 2022-04-30 DIAGNOSIS — H02834 Dermatochalasis of left upper eyelid: Secondary | ICD-10-CM | POA: Diagnosis not present

## 2022-04-30 DIAGNOSIS — H04123 Dry eye syndrome of bilateral lacrimal glands: Secondary | ICD-10-CM | POA: Diagnosis not present

## 2022-04-30 DIAGNOSIS — H02822 Cysts of right lower eyelid: Secondary | ICD-10-CM | POA: Diagnosis not present

## 2022-04-30 DIAGNOSIS — E119 Type 2 diabetes mellitus without complications: Secondary | ICD-10-CM | POA: Diagnosis not present

## 2022-04-30 DIAGNOSIS — H43822 Vitreomacular adhesion, left eye: Secondary | ICD-10-CM | POA: Diagnosis not present

## 2022-04-30 DIAGNOSIS — H2513 Age-related nuclear cataract, bilateral: Secondary | ICD-10-CM | POA: Diagnosis not present

## 2022-06-09 ENCOUNTER — Other Ambulatory Visit (INDEPENDENT_AMBULATORY_CARE_PROVIDER_SITE_OTHER): Payer: Medicare Other

## 2022-06-09 ENCOUNTER — Encounter: Payer: Self-pay | Admitting: Endocrinology

## 2022-06-09 DIAGNOSIS — E1165 Type 2 diabetes mellitus with hyperglycemia: Secondary | ICD-10-CM

## 2022-06-09 LAB — BASIC METABOLIC PANEL
BUN: 22 mg/dL (ref 6–23)
CO2: 29 mEq/L (ref 19–32)
Calcium: 10.7 mg/dL — ABNORMAL HIGH (ref 8.4–10.5)
Chloride: 104 mEq/L (ref 96–112)
Creatinine, Ser: 1.23 mg/dL — ABNORMAL HIGH (ref 0.40–1.20)
GFR: 43.57 mL/min — ABNORMAL LOW (ref >60.00)
Glucose, Bld: 129 mg/dL — ABNORMAL HIGH (ref 70–99)
Potassium: 4.3 mEq/L (ref 3.5–5.1)
Sodium: 140 mEq/L (ref 135–145)

## 2022-06-09 LAB — HEMOGLOBIN A1C: Hgb A1c MFr Bld: 7.3 % — ABNORMAL HIGH (ref 4.6–6.5)

## 2022-06-15 ENCOUNTER — Other Ambulatory Visit: Payer: Self-pay

## 2022-06-15 ENCOUNTER — Ambulatory Visit: Payer: Medicare Other | Admitting: Endocrinology

## 2022-06-15 ENCOUNTER — Encounter: Payer: Self-pay | Admitting: Endocrinology

## 2022-06-15 VITALS — BP 118/78 | HR 67 | Ht 66.5 in | Wt 170.6 lb

## 2022-06-15 DIAGNOSIS — I10 Essential (primary) hypertension: Secondary | ICD-10-CM

## 2022-06-15 DIAGNOSIS — E063 Autoimmune thyroiditis: Secondary | ICD-10-CM

## 2022-06-15 DIAGNOSIS — E782 Mixed hyperlipidemia: Secondary | ICD-10-CM | POA: Diagnosis not present

## 2022-06-15 DIAGNOSIS — Z23 Encounter for immunization: Secondary | ICD-10-CM

## 2022-06-15 DIAGNOSIS — E1165 Type 2 diabetes mellitus with hyperglycemia: Secondary | ICD-10-CM

## 2022-06-15 MED ORDER — OZEMPIC (2 MG/DOSE) 8 MG/3ML ~~LOC~~ SOPN: PEN_INJECTOR | SUBCUTANEOUS | 2 refills | Status: DC

## 2022-06-15 MED ORDER — EMPAGLIFLOZIN 25 MG PO TABS: 25.0000 mg | ORAL_TABLET | Freq: Every day | ORAL | 1 refills | Status: DC

## 2022-06-15 NOTE — Patient Instructions (Addendum)
Check blood sugars on waking up 3  days a week  Also check blood sugars about 2 hours after meals and do this after different meals by rotation  Recommended blood sugar levels on waking up are 90-130 and about 2 hours after meal is 130-160  Please bring your blood sugar monitor to each visit, thank you  Check BP weekly  Use the 2 mg Ozempic pen and dial halfway to get 1 mg dose weekly

## 2022-06-15 NOTE — Progress Notes (Signed)
Patient ID: Crystal Hobbs, female   DOB: [birth year], 71 y.o.   MRN: [id number]   Reason for Appointment: Endocrinology follow-up    History of Present Illness   Diagnosis: Type 2 DIABETES MELITUS, date of diagnosis:  2011   Past history: Her initial presentation with diabetes was with significant symptoms and glucose of 947 Initially was treated with insulin and subsequently on metformin which she could not tolerate  Since she was not responding to Victoza and had significant hyperglycemia she was started on basal bolus insulin regimen instead  However had required relatively large doses of insulin with inadequate control and blood sugars subsequently improved significantly with adding Victoza in 10/13  Her  A1c was 6.4 in 4/14 and she had overall good control with minimal hypoglycemia She was taken off  Afrezza because of her high out-of-pocket expense and she was tried on V-go pump which she had some difficulties getting this through the pharmacy  Previous insulin settings on OMNIPOD insulin pump started on 08/16/2018: BASAL rates 1.6 midnight-7 AM, 7 AM = 1.2, 10 AM = 1.1, 2 PM = 1.2 and 7 PM = 1.6, total basal 33 units Carbohydrate coverage 1: 10.  Preset boluses 3-5 units at breakfast 4-5 at lunch and 6 units at dinner Correction 1: 50 with target 100-120   RECENT history:    Non-insulin hypoglycemic drugs: Jardiance 25 mg daily, Ozempic 0.5 mg weekly  Her A1c is slightly higher at 7.3  However, lowest reading previously 6.7  Current management, blood sugar patterns and problems identified: She has been able to continue Ozempic but still continues to be on 0.5 mg Although she thinks it does help her with satiety she has not lost any weight For a couple of weeks she was without her Ozempic when she went on her cruise and did not watch her diet She is not check her sugars 2 hours after her dinner usually but after lunch highest is 184 Again she is able to exercise very  consistently with tennis and walking  Weight is about the same No recurrence of vaginal candidiasis since her last visit Morning sugars are mildly increased but not consistently Very few readings available nonfasting   Physical activity: exercise: Walking up to 3 miles a day or plays tennis 3/7 days a week,             Side effects from medications:  diarrhea from metformin ? Hair loss from Actos Proper timing of medications in relation to meals: Yes         Monitors blood glucose: Mostly once a day.  Glucometer:  Contour Next .          Blood Glucose readings from download   PRE-MEAL Fasting Lunch Dinner Bedtime Overall  Glucose range: 104-148   111-129  95-185  Mean/median: 124    128   POST-MEAL PC Breakfast PC Lunch PC Dinner  Glucose range:  ?  Up to 184   Mean/median:      Previous download:  PRE-MEAL Fasting Lunch Evening Bedtime Overall  Glucose range:     73-298  Mean/median: 127 136 131     POST-MEAL PC Breakfast PC Lunch PC Dinner  Glucose range:     Mean/median:   127     Diabetes education visit: Most recent: 05/2012      CDE visit 12/19        Wt Readings from Last 3 Encounters:  06/15/22 170 lb 9.6 oz (77.4 kg)  01/29/22 170 lb 3.2 oz (77.2 kg)  11/18/21 171 lb (77.6 kg)    Lab Results  Component Value Date   HGBA1C 7.3 (H) 06/09/2022   HGBA1C 7.1 (H) 01/27/2022   HGBA1C 7.7 (H) 10/21/2021   Lab Results  Component Value Date   MICROALBUR 14.4 (H) 01/27/2022   LDLCALC 89 04/15/2021   CREATININE 1.23 (H) 06/09/2022    Other problems discussed today: See review of systems   Lab on 06/09/2022  Component Date Value Ref Range Status   Sodium 06/09/2022 140  135 - 145 mEq/L Final   Potassium 06/09/2022 4.3  3.5 - 5.1 mEq/L Final   Chloride 06/09/2022 104  96 - 112 mEq/L Final   CO2 06/09/2022 29  19 - 32 mEq/L Final   Glucose, Bld 06/09/2022 129 (H)  70 - 99 mg/dL Final   BUN 06/09/2022 22  6 - 23 mg/dL Final   Creatinine, Ser 06/09/2022  1.23 (H)  0.40 - 1.20 mg/dL Final   GFR 06/09/2022 43.57 (L)  >60.00 mL/min Final   Calculated using the CKD-EPI Creatinine Equation (2021)   Calcium 06/09/2022 10.7 (H)  8.4 - 10.5 mg/dL Final   Hgb A1c MFr Bld 06/09/2022 7.3 (H)  4.6 - 6.5 % Final   Glycemic Control Guidelines for People with Diabetes:Non Diabetic:  <6%Goal of Therapy: <7%Additional Action Suggested:  >8%     Allergies as of 06/15/2022       Reactions   Actos [pioglitazone]    hairloss   Metformin And Related Diarrhea   Novocain [procaine]    syncopy   Sulfa Antibiotics Itching        Medication List        Accurate as of June 15, 2022  1:48 PM. If you have any questions, ask your nurse or doctor.          amLODipine 5 MG tablet Commonly known as: NORVASC Take 1 tablet (5 mg total) by mouth daily. Please make overdue appt with Dr. Radford Pax before anymore refills. Thank you 2nd attempt   aspirin EC 81 MG tablet Take 81 mg by mouth daily. Patient is taking 4 pills daily   Biotin 5000 MCG Caps Take 1 capsule by mouth daily.   calcium carbonate 1250 (500 Ca) MG tablet Commonly known as: OS-CAL - dosed in mg of elemental calcium Take 1 tablet by mouth.   CINNAMON PO Take by mouth.   Contour Next Monitor w/Device Kit Use to check blood sugar daily.   Contour Next Test test strip Generic drug: glucose blood USE TO TEST BLOOD SUGAR 7 TIMES DAILY   empagliflozin 25 MG Tabs tablet Commonly known as: Jardiance Take 1 tablet (25 mg total) by mouth daily.   ezetimibe-simvastatin 10-40 MG tablet Commonly known as: VYTORIN TAKE 1 TABLET BY MOUTH DAILY AT 6 PM   levothyroxine 112 MCG tablet Commonly known as: SYNTHROID TAKE 1 TABLET(112 MCG) BY MOUTH DAILY   nitroGLYCERIN 0.4 MG SL tablet Commonly known as: NITROSTAT Place 1 tablet (0.4 mg total) under the tongue every 5 (five) minutes as needed for chest pain.   olmesartan 20 MG tablet Commonly known as: BENICAR TAKE 1 TABLET(20 MG) BY MOUTH  DAILY   omega-3 acid ethyl esters 1 g capsule Commonly known as: LOVAZA Take 2 g by mouth 2 (two) times daily.   Ozempic (0.25 or 0.5 MG/DOSE) 2 MG/1.5ML Sopn Generic drug: Semaglutide(0.25 or 0.5MG/DOS) Inject 0.5 mg into the skin once a week.   Semaglutide(0.25 or  0.5MG/DOS) 2 MG/3ML Sopn Inject 0.5 mg into the skin once a week   Super B Complex/C Caps Take 1 tablet by mouth daily.   Vitamin D3 10 MCG (400 UNIT) Caps Take by mouth.        Allergies:  Allergies  Allergen Reactions   Actos [Pioglitazone]     hairloss   Metformin And Related Diarrhea   Novocain [Procaine]     syncopy   Sulfa Antibiotics Itching    Past Medical History:  Diagnosis Date   Allergy    Arthritis    Complication of anesthesia    takes along time to wake up   Contact lens/glasses fitting    wears glasses or contacts   Coronary artery disease 01/13/2007   s/p PCI of diagonal    Diabetes mellitus without complication (HCC)    Dyslipidemia    Heart attack ([place])    had a blood clot, got a stent, but pt states she was told it was not from plaque   Hyperlipidemia    Hypertension    Hypothyroidism    Snores    Vertigo     Past Surgical History:  Procedure Laterality Date   CARDIAC CATHETERIZATION  09/14/2006   stent   COLONOSCOPY     rotator cuff repair Right    twice   SHOULDER ARTHROSCOPY WITH OPEN ROTATOR CUFF REPAIR Right 02/28/2013   Procedure: right shoulder arthroscopy, distal clavicle sculpting, open rotator cuff repair;  Surgeon: Cammie Sickle., MD;  Location: [facility];  Service: Orthopedics;  Laterality: Right;   TUBAL LIGATION      Family History  Problem Relation Age of Onset   Lupus Mother    Hypertension Father    Heart attack Father    Sleep apnea Brother    Heart attack Other    Sleep apnea Son    Leukemia Maternal Aunt    Other Maternal Uncle        blood disorder    Social History:  reports that she has never smoked. She has  never used smokeless tobacco. She reports current alcohol use. She reports that she does not use drugs.  Review of Systems:  HYPERTENSION:  followed by PCP/cardiologist, No checks of blood pressure at home:   Her drugs include Benicar 40 mg with amlodipine 5 mg Also prescribed by cardiologist  Her urine microalbumin is back to normal on the last measurement  BP Readings from Last 3 Encounters:  06/15/22 118/78  01/29/22 140/76  11/18/21 (!) 151/82   RENAL function: Recent history:   Lab Results  Component Value Date   CREATININE 1.23 (H) 06/09/2022   CREATININE 1.07 01/27/2022   CREATININE 1.05 10/21/2021     HYPERLIPIDEMIA: She has had elevated LDL and triglycerides, prescribed Vytorin and Lovaza, followed by PCP and cardiologist.   She was tried on Crestor but she thinks this caused leg pain  Has mild increase in triglycerides and low normal HDL  Lab Results  Component Value Date   CHOL 160 04/15/2021   CHOL 120 08/14/2019   CHOL 162 05/26/2018   Lab Results  Component Value Date   HDL 38.00 (L) 04/15/2021   HDL 40.20 08/14/2019   HDL 37.70 (L) 05/26/2018   Lab Results  Component Value Date   LDLCALC 89 04/15/2021   LDLCALC 43 08/14/2019   LDLCALC 89 05/26/2018   Lab Results  Component Value Date   TRIG 167.0 (H) 04/15/2021   TRIG 186.0 (H) 08/14/2019  TRIG 178.0 (H) 05/26/2018   Lab Results  Component Value Date   CHOLHDL 4 04/15/2021   CHOLHDL 3 08/14/2019   CHOLHDL 4 05/26/2018   Lab Results  Component Value Date   LDLDIRECT 130.0 01/18/2018   LDLDIRECT 121.2 05/08/2013     History of mild hypothyroidism, treated for several years Her TSH was high in 5/19 when she had her last dosage increase  She is taking 112 mcg levothyroxine and her TSH is consistently normal She feels good  Lab Results  Component Value Date   TSH 1.11 01/27/2022   TSH 2.20 07/08/2021   TSH 2.17 01/21/2021        Examination:   BP 118/78   Pulse 67   Ht  5' 6.5" (1.689 m)   Wt 170 lb 9.6 oz (77.4 kg)   SpO2 98%   BMI 27.12 kg/m   Body mass index is 27.12 kg/m.   Diabetic Foot Exam - Simple   Simple Foot Form Diabetic Foot exam was performed with the following findings: Yes   Visual Inspection No deformities, no ulcerations, no other skin breakdown bilaterally: Yes See comments: Yes Sensation Testing Intact to touch and monofilament testing bilaterally: Yes See comments: Yes Pulse Check Posterior Tibialis and Dorsalis pulse intact bilaterally: Yes Comments Mild decrease in monofilament sensation on distal plantar surfaces Mild callus formation bases of first toes     No pedal edema  ASSESSMENT/ PLAN:   Diabetes type 2   See history of present illness for  discussion of current management, blood sugar patterns and problems identified  Her A1c is 7.3   She is continuing to be on Jardiance, Ozempic 0.5 mg  Her blood sugars are looking good at home but not clear why her A1c is higher May be having higher postprandial readings which are not checked in the evening However she can do better with 1 mg Ozempic instead of 0.5 with overall better control and some more weight loss  With the 2 mg Ozempic device she can dial halfway and still get 1 mg, this should help her with coping with the donut hole Check more readings after dinner Discussed blood sugar targets  History of hypertension: She needs to check blood pressure weekly and let her cardiologist know if blood pressure is relatively low  HYPOTHYROIDISM: Follow-up labs on the next visit  Flu vaccine given  Patient Instructions  Check blood sugars on waking up 3  days a week  Also check blood sugars about 2 hours after meals and do this after different meals by rotation  Recommended blood sugar levels on waking up are 90-130 and about 2 hours after meal is 130-160  Please bring your blood sugar monitor to each visit, thank you  Check BP weekly     Elayne Snare 06/15/2022, 1:48 PM

## 2022-06-22 ENCOUNTER — Telehealth: Payer: Self-pay

## 2022-06-22 DIAGNOSIS — E1165 Type 2 diabetes mellitus with hyperglycemia: Secondary | ICD-10-CM

## 2022-06-22 NOTE — Telephone Encounter (Signed)
Patient called states that ozempic is on backorder. Please advise

## 2022-06-29 MED ORDER — SEMAGLUTIDE(0.25 OR 0.5MG/DOS) 2 MG/3ML ~~LOC~~ SOPN: PEN_INJECTOR | SUBCUTANEOUS | 0 refills | Status: DC

## 2022-06-29 NOTE — Telephone Encounter (Signed)
Rx sent and detail message left on patient answer machine

## 2022-06-29 NOTE — Addendum Note (Signed)
Addended by: Cinda Quest on: 06/29/2022 04:01 PM   Modules accepted: Orders

## 2022-07-30 DIAGNOSIS — I10 Essential (primary) hypertension: Secondary | ICD-10-CM | POA: Diagnosis not present

## 2022-07-30 DIAGNOSIS — Z0000 Encounter for general adult medical examination without abnormal findings: Secondary | ICD-10-CM | POA: Diagnosis not present

## 2022-07-30 DIAGNOSIS — E039 Hypothyroidism, unspecified: Secondary | ICD-10-CM | POA: Diagnosis not present

## 2022-07-30 DIAGNOSIS — I2510 Atherosclerotic heart disease of native coronary artery without angina pectoris: Secondary | ICD-10-CM | POA: Diagnosis not present

## 2022-07-30 DIAGNOSIS — E1169 Type 2 diabetes mellitus with other specified complication: Secondary | ICD-10-CM | POA: Diagnosis not present

## 2022-07-30 DIAGNOSIS — E7800 Pure hypercholesterolemia, unspecified: Secondary | ICD-10-CM | POA: Diagnosis not present

## 2022-08-20 ENCOUNTER — Other Ambulatory Visit: Payer: Self-pay | Admitting: Endocrinology

## 2022-08-20 DIAGNOSIS — E1165 Type 2 diabetes mellitus with hyperglycemia: Secondary | ICD-10-CM

## 2022-09-22 DIAGNOSIS — H01136 Eczematous dermatitis of left eye, unspecified eyelid: Secondary | ICD-10-CM | POA: Diagnosis not present

## 2022-09-22 DIAGNOSIS — H04123 Dry eye syndrome of bilateral lacrimal glands: Secondary | ICD-10-CM | POA: Diagnosis not present

## 2022-09-22 DIAGNOSIS — H01133 Eczematous dermatitis of right eye, unspecified eyelid: Secondary | ICD-10-CM | POA: Diagnosis not present

## 2022-09-28 DIAGNOSIS — Z1283 Encounter for screening for malignant neoplasm of skin: Secondary | ICD-10-CM | POA: Diagnosis not present

## 2022-09-28 DIAGNOSIS — L908 Other atrophic disorders of skin: Secondary | ICD-10-CM | POA: Diagnosis not present

## 2022-09-28 DIAGNOSIS — D225 Melanocytic nevi of trunk: Secondary | ICD-10-CM | POA: Diagnosis not present

## 2022-10-15 ENCOUNTER — Other Ambulatory Visit (INDEPENDENT_AMBULATORY_CARE_PROVIDER_SITE_OTHER): Payer: Medicare Other

## 2022-10-15 DIAGNOSIS — E782 Mixed hyperlipidemia: Secondary | ICD-10-CM

## 2022-10-15 DIAGNOSIS — E1165 Type 2 diabetes mellitus with hyperglycemia: Secondary | ICD-10-CM | POA: Diagnosis not present

## 2022-10-15 DIAGNOSIS — E063 Autoimmune thyroiditis: Secondary | ICD-10-CM | POA: Diagnosis not present

## 2022-10-15 LAB — COMPREHENSIVE METABOLIC PANEL
ALT: 21 U/L (ref 0–35)
AST: 22 U/L (ref 0–37)
Albumin: 4.4 g/dL (ref 3.5–5.2)
Alkaline Phosphatase: 71 U/L (ref 39–117)
BUN: 24 mg/dL — ABNORMAL HIGH (ref 6–23)
CO2: 30 mEq/L (ref 19–32)
Calcium: 10.1 mg/dL (ref 8.4–10.5)
Chloride: 104 mEq/L (ref 96–112)
Creatinine, Ser: 1.16 mg/dL (ref 0.40–1.20)
GFR: 46.63 mL/min — ABNORMAL LOW (ref >60.00)
Glucose, Bld: 145 mg/dL — ABNORMAL HIGH (ref 70–99)
Potassium: 4.1 mEq/L (ref 3.5–5.1)
Sodium: 141 mEq/L (ref 135–145)
Total Bilirubin: 0.5 mg/dL (ref 0.2–1.2)
Total Protein: 7.4 g/dL (ref 6.0–8.3)

## 2022-10-15 LAB — LDL CHOLESTEROL, DIRECT: Direct LDL: 61 mg/dL

## 2022-10-15 LAB — HEMOGLOBIN A1C: Hgb A1c MFr Bld: 7.5 % — ABNORMAL HIGH (ref 4.6–6.5)

## 2022-10-15 LAB — T4, FREE: Free T4: 0.99 ng/dL (ref 0.60–1.60)

## 2022-10-15 LAB — TSH: TSH: 3.57 u[IU]/mL (ref 0.35–5.50)

## 2022-10-20 ENCOUNTER — Ambulatory Visit: Payer: Medicare Other | Admitting: Endocrinology

## 2022-10-20 ENCOUNTER — Encounter: Payer: Self-pay | Admitting: Endocrinology

## 2022-10-20 ENCOUNTER — Other Ambulatory Visit: Payer: Self-pay

## 2022-10-20 VITALS — BP 126/88 | HR 62 | Ht 66.5 in | Wt 169.6 lb

## 2022-10-20 DIAGNOSIS — E1165 Type 2 diabetes mellitus with hyperglycemia: Secondary | ICD-10-CM | POA: Diagnosis not present

## 2022-10-20 DIAGNOSIS — I10 Essential (primary) hypertension: Secondary | ICD-10-CM | POA: Diagnosis not present

## 2022-10-20 DIAGNOSIS — E063 Autoimmune thyroiditis: Secondary | ICD-10-CM | POA: Diagnosis not present

## 2022-10-20 DIAGNOSIS — E1129 Type 2 diabetes mellitus with other diabetic kidney complication: Secondary | ICD-10-CM

## 2022-10-20 DIAGNOSIS — R809 Proteinuria, unspecified: Secondary | ICD-10-CM

## 2022-10-20 MED ORDER — TIRZEPATIDE 2.5 MG/0.5ML ~~LOC~~ SOAJ: 2.5000 mg | SUBCUTANEOUS | 0 refills | Status: DC

## 2022-10-20 MED ORDER — CONTOUR NEXT MONITOR W/DEVICE KIT: PACK | 0 refills | Status: AC

## 2022-10-20 MED ORDER — TIRZEPATIDE 5 MG/0.5ML ~~LOC~~ SOAJ: 5.0000 mg | SUBCUTANEOUS | 0 refills | Status: DC

## 2022-10-20 NOTE — Progress Notes (Signed)
Patient ID: Crystal Hobbs, female   DOB: 23-Aug-1951, 72 y.o.   MRN: [id number]   Reason for Appointment: Endocrinology follow-up    History of Present Illness   Diagnosis: Type 2 DIABETES MELITUS, date of diagnosis:  2011   Past history: Her initial presentation with diabetes was with significant symptoms and glucose of 947 Initially was treated with insulin and subsequently on metformin which she could not tolerate  Since she was not responding to Victoza and had significant hyperglycemia she was started on basal bolus insulin regimen instead  However had required relatively large doses of insulin with inadequate control and blood sugars subsequently improved significantly with adding Victoza in 10/13  Her  A1c was 6.4 in 4/14 and she had overall good control with minimal hypoglycemia She was taken off  Afrezza because of her high out-of-pocket expense and she was tried on V-go pump which she had some difficulties getting this through the pharmacy  Previous insulin settings on OMNIPOD insulin pump started on 08/16/2018: BASAL rates 1.6 midnight-7 AM, 7 AM = 1.2, 10 AM = 1.1, 2 PM = 1.2 and 7 PM = 1.6, total basal 33 units Carbohydrate coverage 1: 10.  Preset boluses 3-5 units at breakfast 4-5 at lunch and 6 units at dinner Correction 1: 50 with target 100-120   RECENT history:    Non-insulin hypoglycemic drugs: Jardiance 25 mg daily, Ozempic 0.5 mg weekly  Her A1c is slightly higher at 7.5  Historically, lowest reading previously 6.7  Current management, blood sugar patterns and problems identified: She has not been able to continue Ozempic consistently because of supply issues at the pharmacy She has taken it in the last night but only 0.5 mg dose Although she is trying to exercise more and keeping her weight level her blood sugars are generally higher She lost her meter and unable to review this She also says that occasionally her fasting readings are significantly  higher She thinks her blood sugars are about 190 after dinner but likely not checking regularly Also she is eating a small snack at bedtime since her dinner is earlier than usual Tolerating Jardiance well Overall her diet is fairly good   Physical activity: exercise: Walking up to 3 miles a day or plays tennis 4/7 days a week,             Side effects from medications:  diarrhea from metformin ? Hair loss from Actos Proper timing of medications in relation to meals: Yes         Monitors blood glucose: Mostly once a day.  Glucometer:  Contour Next .          Blood Glucose readings from recall:   PRE-MEAL Fasting Lunch Dinner Bedtime Overall  Glucose range: 125-161      Mean/median:        POST-MEAL PC Breakfast PC Lunch PC Dinner  Glucose range:   190  Mean/median:       prior  PRE-MEAL Fasting Lunch Dinner Bedtime Overall  Glucose range: 104-148   111-129  95-185  Mean/median: 124    128   POST-MEAL PC Breakfast PC Lunch PC Dinner  Glucose range:  ?  Up to 184   Mean/median:        Diabetes education visit: Most recent: 05/2012      CDE visit 12/19        Wt Readings from Last 3 Encounters:  10/20/22 169 lb 9.6 oz (76.9 kg)  06/15/22 170  lb 9.6 oz (77.4 kg)  01/29/22 170 lb 3.2 oz (77.2 kg)    Lab Results  Component Value Date   HGBA1C 7.5 (H) 10/15/2022   HGBA1C 7.3 (H) 06/09/2022   HGBA1C 7.1 (H) 01/27/2022   Lab Results  Component Value Date   MICROALBUR 14.4 (H) 01/27/2022   LDLCALC 89 04/15/2021   CREATININE 1.16 10/15/2022    Other problems discussed today: See review of systems   Lab on 10/15/2022  Component Date Value Ref Range Status   Hgb A1c MFr Bld 10/15/2022 7.5 (H)  4.6 - 6.5 % Final   Glycemic Control Guidelines for People with Diabetes:Non Diabetic:  <6%Goal of Therapy: <7%Additional Action Suggested:  >8%    Sodium 10/15/2022 141  135 - 145 mEq/L Final   Potassium 10/15/2022 4.1  3.5 - 5.1 mEq/L Final   Chloride 10/15/2022 104   96 - 112 mEq/L Final   CO2 10/15/2022 30  19 - 32 mEq/L Final   Glucose, Bld 10/15/2022 145 (H)  70 - 99 mg/dL Final   BUN 10/15/2022 24 (H)  6 - 23 mg/dL Final   Creatinine, Ser 10/15/2022 1.16  0.40 - 1.20 mg/dL Final   Total Bilirubin 10/15/2022 0.5  0.2 - 1.2 mg/dL Final   Alkaline Phosphatase 10/15/2022 71  39 - 117 U/L Final   AST 10/15/2022 22  0 - 37 U/L Final   ALT 10/15/2022 21  0 - 35 U/L Final   Total Protein 10/15/2022 7.4  6.0 - 8.3 g/dL Final   Albumin 10/15/2022 4.4  3.5 - 5.2 g/dL Final   GFR 10/15/2022 46.63 (L)  >60.00 mL/min Final   Calculated using the CKD-EPI Creatinine Equation (2021)   Calcium 10/15/2022 10.1  8.4 - 10.5 mg/dL Final   Direct LDL 10/15/2022 61.0  mg/dL Final   Optimal:  <100 mg/dLNear or Above Optimal:  100-129 mg/dLBorderline High:  130-159 mg/dLHigh:  160-189 mg/dLVery High:  >190 mg/dL   TSH 10/15/2022 3.57  0.35 - 5.50 uIU/mL Final   Free T4 10/15/2022 0.99  0.60 - 1.60 ng/dL Final   Comment: Specimens from patients who are undergoing biotin therapy and /or ingesting biotin supplements may contain high levels of biotin.  The higher biotin concentration in these specimens interferes with this Free T4 assay.  Specimens that contain high levels  of biotin may cause false high results for this Free T4 assay.  Please interpret results in light of the total clinical presentation of the patient.      Allergies as of 10/20/2022       Reactions   Actos [pioglitazone]    hairloss   Metformin And Related Diarrhea   Novocain [procaine]    syncopy   Sulfa Antibiotics Itching        Medication List        Accurate as of October 20, 2022  3:04 PM. If you have any questions, ask your nurse or doctor.          STOP taking these medications    Biotin 5000 MCG Caps Stopped by: Elayne Snare, MD   calcium carbonate 1250 (500 Ca) MG tablet Commonly known as: OS-CAL - dosed in mg of elemental calcium Stopped by: Elayne Snare, MD   CINNAMON  PO Stopped by: Elayne Snare, MD   Ozempic (0.25 or 0.5 MG/DOSE) 2 MG/3ML Sopn Generic drug: Semaglutide(0.25 or 0.'5MG'$ /DOS) Stopped by: Elayne Snare, MD   Ozempic (2 MG/DOSE) 8 MG/3ML Sopn Generic drug: Semaglutide (2 MG/DOSE) Stopped by:  Elayne Snare, MD   Super B Complex/C Caps Stopped by: Elayne Snare, MD   Vitamin D3 10 MCG (400 UNIT) Caps Stopped by: Elayne Snare, MD       TAKE these medications    amLODipine 5 MG tablet Commonly known as: NORVASC Take 1 tablet (5 mg total) by mouth daily. Please make overdue appt with Dr. Radford Pax before anymore refills. Thank you 2nd attempt   aspirin EC 81 MG tablet Take 81 mg by mouth daily. Patient is taking 4 pills daily   Contour Next Monitor w/Device Kit Use to check blood sugar daily.   Contour Next Test test strip Generic drug: glucose blood USE TO TEST BLOOD SUGAR 7 TIMES DAILY   empagliflozin 25 MG Tabs tablet Commonly known as: Jardiance Take 1 tablet (25 mg total) by mouth daily.   ezetimibe-simvastatin 10-40 MG tablet Commonly known as: VYTORIN TAKE 1 TABLET BY MOUTH DAILY AT 6 PM   levothyroxine 112 MCG tablet Commonly known as: SYNTHROID TAKE 1 TABLET(112 MCG) BY MOUTH DAILY   nitroGLYCERIN 0.4 MG SL tablet Commonly known as: NITROSTAT Place 1 tablet (0.4 mg total) under the tongue every 5 (five) minutes as needed for chest pain.   olmesartan 20 MG tablet Commonly known as: BENICAR TAKE 1 TABLET(20 MG) BY MOUTH DAILY   omega-3 acid ethyl esters 1 g capsule Commonly known as: LOVAZA Take 2 g by mouth 2 (two) times daily.   tirzepatide 5 MG/0.5ML Pen Commonly known as: MOUNJARO Inject 5 mg into the skin once a week. Started by: Elayne Snare, MD        Allergies:  Allergies  Allergen Reactions   Actos [Pioglitazone]     hairloss   Metformin And Related Diarrhea   Novocain [Procaine]     syncopy   Sulfa Antibiotics Itching    Past Medical History:  Diagnosis Date   Allergy    Arthritis     Complication of anesthesia    takes along time to wake up   Contact lens/glasses fitting    wears glasses or contacts   Coronary artery disease 01/13/2007   s/p PCI of diagonal    Diabetes mellitus without complication (HCC)    Dyslipidemia    Heart attack ([place])    had a blood clot, got a stent, but pt states she was told it was not from plaque   Hyperlipidemia    Hypertension    Hypothyroidism    Snores    Vertigo     Past Surgical History:  Procedure Laterality Date   CARDIAC CATHETERIZATION  09/14/2006   stent   COLONOSCOPY     rotator cuff repair Right    twice   SHOULDER ARTHROSCOPY WITH OPEN ROTATOR CUFF REPAIR Right 02/28/2013   Procedure: right shoulder arthroscopy, distal clavicle sculpting, open rotator cuff repair;  Surgeon: Cammie Sickle., MD;  Location: [place];  Service: Orthopedics;  Laterality: Right;   TUBAL LIGATION      Family History  Problem Relation Age of Onset   Lupus Mother    Hypertension Father    Heart attack Father    Sleep apnea Brother    Heart attack Other    Sleep apnea Son    Leukemia Maternal Aunt    Other Maternal Uncle        blood disorder    Social History:  reports that she has never smoked. She has never used smokeless tobacco. She reports current alcohol use. She reports  that she does not use drugs.  Review of Systems:  HYPERTENSION:  followed by PCP/cardiologist, No checks of blood pressure at home:   Her drugs include Benicar 40 mg with amlodipine 5 mg Also prescribed by cardiologist  Her urine microalbumin is back to normal on the last measurement  BP Readings from Last 3 Encounters:  10/20/22 126/88  06/15/22 118/78  01/29/22 140/76   RENAL function: Recent history:   Lab Results  Component Value Date   CREATININE 1.16 10/15/2022   CREATININE 1.23 (H) 06/09/2022   CREATININE 1.07 01/27/2022     HYPERLIPIDEMIA: She has had elevated LDL and triglycerides, prescribed Vytorin and  Lovaza, followed by PCP and cardiologist.   She was tried on Crestor but she thinks this caused leg pain  Has mild increase in triglycerides and low normal HDL  LDL now 61   Lab Results  Component Value Date   CHOL 160 04/15/2021   CHOL 120 08/14/2019   CHOL 162 05/26/2018   Lab Results  Component Value Date   HDL 38.00 (L) 04/15/2021   HDL 40.20 08/14/2019   HDL 37.70 (L) 05/26/2018   Lab Results  Component Value Date   LDLCALC 89 04/15/2021   LDLCALC 43 08/14/2019   LDLCALC 89 05/26/2018   Lab Results  Component Value Date   TRIG 167.0 (H) 04/15/2021   TRIG 186.0 (H) 08/14/2019   TRIG 178.0 (H) 05/26/2018   Lab Results  Component Value Date   CHOLHDL 4 04/15/2021   CHOLHDL 3 08/14/2019   CHOLHDL 4 05/26/2018   Lab Results  Component Value Date   LDLDIRECT 61.0 10/15/2022   LDLDIRECT 130.0 01/18/2018   LDLDIRECT 121.2 05/08/2013     History of mild hypothyroidism, treated for several years  She is taking 112 mcg levothyroxine and her TSH is consistently normal in the last few years She feels good  Lab Results  Component Value Date   TSH 3.57 10/15/2022   TSH 1.11 01/27/2022   TSH 2.20 07/08/2021        Examination:   BP 126/88 (BP Location: Left Arm, Patient Position: Sitting, Cuff Size: Normal)   Pulse 62   Ht 5' 6.5" (1.689 m)   Wt 169 lb 9.6 oz (76.9 kg)   SpO2 (!) 10%   BMI 26.96 kg/m   Body mass index is 26.96 kg/m.    No pedal edema  ASSESSMENT/ PLAN:   Diabetes type 2   See history of present illness for  discussion of current management, blood sugar patterns and problems identified  Her A1c is 7.5   She currently on Jardiance 25 mg, Ozempic 0.5 mg  Her blood sugars are generally higher partly from not getting her Ozempic regularly and also only being able to get the 0.5 dose when she does take it This is despite generally good diet and exercise regimen  Since she has difficulty getting the Ozempic consistently and likely  needs a higher dose we will switch her to [facility] which will be more easily available Discussed with the patient the action of GIP/GLP-1 drugs, the effects on pancreatic and liver function, effects on brain and stomach with improved satiety, slowing gastric emptying, improving satiety and reducing liver glucose output.  Discussed the effects on promoting weight loss. Explained possible side effects of MOUNJARO, most commonly nausea that usually improves over time; discussed safety information in package insert.  Demonstrated the medication injection device and injection technique to the patient.  To start with 2.5 mg  dosage weekly for the first 4 injections and then increase the dose to 5 mg weekly  Patient brochure on Mounjaro given as well as a written prescription for the 5 mg dose  Needs to monitor more readings after dinner She will be given a new glucose monitor Reminded her about blood sugar targets fasting and after meals  Hypertension: Blood pressure relatively well-controlled and she can continue to follow-up with the cardiologist  HYPOTHYROIDISM: Well-controlled with normal TSH  There are no Patient Instructions on file for this visit.     Elayne Snare 10/20/2022, 3:04 PM

## 2022-10-22 ENCOUNTER — Other Ambulatory Visit: Payer: Self-pay

## 2022-10-22 MED ORDER — LEVOTHYROXINE SODIUM 112 MCG PO TABS: ORAL_TABLET | ORAL | 3 refills | Status: DC

## 2022-12-07 ENCOUNTER — Other Ambulatory Visit: Payer: Self-pay | Admitting: Endocrinology

## 2022-12-07 DIAGNOSIS — E1165 Type 2 diabetes mellitus with hyperglycemia: Secondary | ICD-10-CM

## 2022-12-10 DIAGNOSIS — Z1231 Encounter for screening mammogram for malignant neoplasm of breast: Secondary | ICD-10-CM | POA: Diagnosis not present

## 2022-12-15 ENCOUNTER — Other Ambulatory Visit: Payer: Self-pay | Admitting: Endocrinology

## 2022-12-16 ENCOUNTER — Other Ambulatory Visit: Payer: Self-pay | Admitting: Endocrinology

## 2022-12-16 ENCOUNTER — Telehealth: Payer: Self-pay

## 2022-12-16 MED ORDER — FLUCONAZOLE 150 MG PO TABS: 150.0000 mg | ORAL_TABLET | Freq: Once | ORAL | 0 refills | Status: AC

## 2022-12-16 NOTE — Telephone Encounter (Signed)
Patient states that she did have some body pain with the first 3 doses of the [facility] and she stopped and them she took last dose of 2.5mg  last week and was okay. Would you like her to stay on the 2.5mg  dose or do the 5mg .

## 2022-12-16 NOTE — Telephone Encounter (Signed)
Patient states that she has a yeast infection and would like to have medication sent in.

## 2022-12-17 ENCOUNTER — Other Ambulatory Visit: Payer: Self-pay | Admitting: Endocrinology

## 2022-12-17 MED ORDER — FLUCONAZOLE 150 MG PO TABS: 150.0000 mg | ORAL_TABLET | Freq: Once | ORAL | 0 refills | Status: AC

## 2023-01-11 ENCOUNTER — Other Ambulatory Visit (INDEPENDENT_AMBULATORY_CARE_PROVIDER_SITE_OTHER): Payer: Medicare Other

## 2023-01-11 DIAGNOSIS — E1129 Type 2 diabetes mellitus with other diabetic kidney complication: Secondary | ICD-10-CM | POA: Diagnosis not present

## 2023-01-11 DIAGNOSIS — E1165 Type 2 diabetes mellitus with hyperglycemia: Secondary | ICD-10-CM | POA: Diagnosis not present

## 2023-01-11 DIAGNOSIS — R809 Proteinuria, unspecified: Secondary | ICD-10-CM

## 2023-01-11 LAB — BASIC METABOLIC PANEL
BUN: 30 mg/dL — ABNORMAL HIGH (ref 6–23)
CO2: 27 mEq/L (ref 19–32)
Calcium: 10 mg/dL (ref 8.4–10.5)
Chloride: 107 mEq/L (ref 96–112)
Creatinine, Ser: 1.14 mg/dL (ref 0.40–1.20)
GFR: 47.53 mL/min — ABNORMAL LOW (ref >60.00)
Glucose, Bld: 161 mg/dL — ABNORMAL HIGH (ref 70–99)
Potassium: 4.2 mEq/L (ref 3.5–5.1)
Sodium: 141 mEq/L (ref 135–145)

## 2023-01-11 LAB — HEMOGLOBIN A1C: Hgb A1c MFr Bld: 8.1 % — ABNORMAL HIGH (ref 4.6–6.5)

## 2023-01-11 LAB — MICROALBUMIN / CREATININE URINE RATIO
Creatinine,U: 56.7 mg/dL
Microalb Creat Ratio: 22.1 mg/g (ref 0.0–30.0)
Microalb, Ur: 12.5 mg/dL — ABNORMAL HIGH (ref 0.0–1.9)

## 2023-01-13 DIAGNOSIS — I2510 Atherosclerotic heart disease of native coronary artery without angina pectoris: Secondary | ICD-10-CM | POA: Insufficient documentation

## 2023-01-13 DIAGNOSIS — G609 Hereditary and idiopathic neuropathy, unspecified: Secondary | ICD-10-CM | POA: Insufficient documentation

## 2023-01-13 DIAGNOSIS — I10 Essential (primary) hypertension: Secondary | ICD-10-CM | POA: Insufficient documentation

## 2023-01-13 DIAGNOSIS — E1142 Type 2 diabetes mellitus with diabetic polyneuropathy: Secondary | ICD-10-CM | POA: Insufficient documentation

## 2023-01-13 DIAGNOSIS — E1165 Type 2 diabetes mellitus with hyperglycemia: Secondary | ICD-10-CM | POA: Insufficient documentation

## 2023-01-13 DIAGNOSIS — E7800 Pure hypercholesterolemia, unspecified: Secondary | ICD-10-CM | POA: Insufficient documentation

## 2023-01-13 DIAGNOSIS — Z86010 Personal history of colonic polyps: Secondary | ICD-10-CM | POA: Insufficient documentation

## 2023-01-14 ENCOUNTER — Ambulatory Visit: Payer: Medicare Other | Admitting: Endocrinology

## 2023-01-14 ENCOUNTER — Encounter: Payer: Self-pay | Admitting: Endocrinology

## 2023-01-14 VITALS — BP 132/82 | HR 59 | Ht 66.5 in | Wt 168.0 lb

## 2023-01-14 DIAGNOSIS — Z7985 Long-term (current) use of injectable non-insulin antidiabetic drugs: Secondary | ICD-10-CM

## 2023-01-14 DIAGNOSIS — E1165 Type 2 diabetes mellitus with hyperglycemia: Secondary | ICD-10-CM

## 2023-01-14 DIAGNOSIS — E063 Autoimmune thyroiditis: Secondary | ICD-10-CM

## 2023-01-14 DIAGNOSIS — I10 Essential (primary) hypertension: Secondary | ICD-10-CM

## 2023-01-14 MED ORDER — OZEMPIC (0.25 OR 0.5 MG/DOSE) 2 MG/3ML ~~LOC~~ SOPN: 0.5000 mg | PEN_INJECTOR | SUBCUTANEOUS | 1 refills | Status: DC

## 2023-01-14 MED ORDER — OZEMPIC (1 MG/DOSE) 4 MG/3ML ~~LOC~~ SOPN: PEN_INJECTOR | SUBCUTANEOUS | 1 refills | Status: DC

## 2023-01-14 NOTE — Progress Notes (Signed)
Patient ID: Crystal Hobbs, female   DOB: 11-Apr-1951, 72 y.o.   MRN: [id number]   Reason for Appointment: Endocrinology follow-up    History of Present Illness   Diagnosis: Type 2 DIABETES MELITUS, date of diagnosis:  2011   Past history: Her initial presentation with diabetes was with significant symptoms and glucose of 947 Initially was treated with insulin and subsequently on metformin which she could not tolerate  Since she was not responding to Victoza and had significant hyperglycemia she was started on basal bolus insulin regimen instead  However had required relatively large doses of insulin with inadequate control and blood sugars subsequently improved significantly with adding Victoza in 10/13  Her  A1c was 6.4 in 4/14 and she had overall good control with minimal hypoglycemia She was taken off  Afrezza because of her high out-of-pocket expense and she was tried on V-go pump which she had some difficulties getting this through the pharmacy  Previous insulin settings on OMNIPOD insulin pump started on 08/16/2018: BASAL rates 1.6 midnight-7 AM, 7 AM = 1.2, 10 AM = 1.1, 2 PM = 1.2 and 7 PM = 1.6, total basal 33 units Carbohydrate coverage 1: 10.  Preset boluses 3-5 units at breakfast 4-5 at lunch and 6 units at dinner Correction 1: 50 with target 100-120   RECENT history:    Non-insulin hypoglycemic drugs: Jardiance 25 mg daily  Her A1c is again higher at 8.1  Historically, lowest A1c previously 6.7  Current management, blood sugar patterns and problems identified: She has not been able to continue Mounjaro that was tried on the last visit, previously could not get Ozempic consistently at the pharmacy However she says that despite requests she could not get refill on her Mounjaro after her 2.5 mg trial With this her blood sugars were relatively better but she did not think it was helping her satiety as much She is checking her blood sugars with fingersticks but did  not bring her monitor for download and not clear how often she is checking Lab fasting 161 She has relatively high readings as detailed below with not taking a GLP-1 drug She is still exercising and able to maintain her weight  Has not changed her diet  Physical activity: exercise: Walking up to 3 miles a day or plays tennis 3/7 days a week,             Side effects from medications:  diarrhea from metformin ? Hair loss from Actos Proper timing of medications in relation to meals: Yes         Monitors blood glucose: Mostly once a day.  Glucometer:  Contour Next .          Blood Glucose readings from recall:   PRE-MEAL Fasting Lunch Dinner Bedtime Overall  Glucose range: 135-180  120 130-200   Mean/median:        Prior:  PRE-MEAL Fasting Lunch Dinner Bedtime Overall  Glucose range: 125-161      Mean/median:        POST-MEAL PC Breakfast PC Lunch PC Dinner  Glucose range:   190  Mean/median:       Diabetes education visit: Most recent: 05/2012      CDE visit 12/19        Wt Readings from Last 3 Encounters:  01/14/23 168 lb (76.2 kg)  10/20/22 169 lb 9.6 oz (76.9 kg)  06/15/22 170 lb 9.6 oz (77.4 kg)    Lab Results  Component Value  Date   HGBA1C 8.1 (H) 01/11/2023   HGBA1C 7.5 (H) 10/15/2022   HGBA1C 7.3 (H) 06/09/2022   Lab Results  Component Value Date   MICROALBUR 12.5 (H) 01/11/2023   LDLCALC 89 04/15/2021   CREATININE 1.14 01/11/2023    Other problems discussed today: See review of systems   Lab on 01/11/2023  Component Date Value Ref Range Status   Microalb, Ur 01/11/2023 12.5 (H)  0.0 - 1.9 mg/dL Final   Creatinine,U 16/06/9603 56.7  mg/dL Final   Microalb Creat Ratio 01/11/2023 22.1  0.0 - 30.0 mg/g Final   Sodium 01/11/2023 141  135 - 145 mEq/L Final   Potassium 01/11/2023 4.2  3.5 - 5.1 mEq/L Final   Chloride 01/11/2023 107  96 - 112 mEq/L Final   CO2 01/11/2023 27  19 - 32 mEq/L Final   Glucose, Bld 01/11/2023 161 (H)  70 - 99 mg/dL Final    BUN 54/05/8118 30 (H)  6 - 23 mg/dL Final   Creatinine, Ser 01/11/2023 1.14  0.40 - 1.20 mg/dL Final   GFR 14/78/2956 47.53 (L)  >60.00 mL/min Final   Calculated using the CKD-EPI Creatinine Equation (2021)   Calcium 01/11/2023 10.0  8.4 - 10.5 mg/dL Final   Hgb O1H MFr Bld 01/11/2023 8.1 (H)  4.6 - 6.5 % Final   Glycemic Control Guidelines for People with Diabetes:Non Diabetic:  <6%Goal of Therapy: <7%Additional Action Suggested:  >8%     Allergies as of 01/14/2023       Reactions   Actos [pioglitazone]    hairloss   Metformin And Related Diarrhea   Novocain [procaine]    syncopy   Sulfa Antibiotics Itching        Medication List        Accurate as of Jan 14, 2023  1:57 PM. If you have any questions, ask your nurse or doctor.          STOP taking these medications    tirzepatide 2.5 MG/0.5ML Pen Commonly known as: MOUNJARO Stopped by: Reather Littler, MD   tirzepatide 5 MG/0.5ML Pen Commonly known as: MOUNJARO Stopped by: Reather Littler, MD       TAKE these medications    amLODipine 5 MG tablet Commonly known as: NORVASC Take 1 tablet (5 mg total) by mouth daily. Please make overdue appt with Dr. Mayford Knife before anymore refills. Thank you 2nd attempt   aspirin EC 81 MG tablet Take 81 mg by mouth daily. Patient is taking 4 pills daily   Contour Next Monitor w/Device Kit Use to check blood sugar daily.   Contour Next Test test strip Generic drug: glucose blood USE TO TEST BLOOD SUGAR 7 TIMES DAILY   ezetimibe-simvastatin 10-40 MG tablet Commonly known as: VYTORIN TAKE 1 TABLET BY MOUTH DAILY AT 6 PM   Jardiance 25 MG Tabs tablet Generic drug: empagliflozin TAKE 1 TABLET(25 MG) BY MOUTH DAILY   levothyroxine 112 MCG tablet Commonly known as: SYNTHROID TAKE 1 TABLET(112 MCG) BY MOUTH DAILY   nitroGLYCERIN 0.4 MG SL tablet Commonly known as: NITROSTAT Place 1 tablet (0.4 mg total) under the tongue every 5 (five) minutes as needed for chest pain.   olmesartan  20 MG tablet Commonly known as: BENICAR TAKE 1 TABLET(20 MG) BY MOUTH DAILY   omega-3 acid ethyl esters 1 g capsule Commonly known as: LOVAZA Take 2 g by mouth 2 (two) times daily.   Ozempic (0.25 or 0.5 MG/DOSE) 2 MG/3ML Sopn Generic drug: Semaglutide(0.25 or 0.5MG /DOS) Inject 0.5  mg into the skin once a week. Started by: Reather Littler, MD   Ozempic (1 MG/DOSE) 4 MG/3ML Sopn Generic drug: Semaglutide (1 MG/DOSE) Inject 1 mg weekly Started by: Reather Littler, MD        Allergies:  Allergies  Allergen Reactions   Actos [Pioglitazone]     hairloss   Metformin And Related Diarrhea   Novocain [Procaine]     syncopy   Sulfa Antibiotics Itching    Past Medical History:  Diagnosis Date   Allergy    Arthritis    Complication of anesthesia    takes along time to wake up   Contact lens/glasses fitting    wears glasses or contacts   Coronary artery disease 01/13/2007   s/p PCI of diagonal    Diabetes mellitus without complication (HCC)    Dyslipidemia    Heart attack ([facility])    had a blood clot, got a stent, but pt states she was told it was not from plaque   Hyperlipidemia    Hypertension    Hypothyroidism    Snores    Vertigo     Past Surgical History:  Procedure Laterality Date   CARDIAC CATHETERIZATION  09/14/2006   stent   COLONOSCOPY     rotator cuff repair Right    twice   SHOULDER ARTHROSCOPY WITH OPEN ROTATOR CUFF REPAIR Right 02/28/2013   Procedure: right shoulder arthroscopy, distal clavicle sculpting, open rotator cuff repair;  Surgeon: Wyn Forster., MD;  Location: [facility];  Service: Orthopedics;  Laterality: Right;   TUBAL LIGATION      Family History  Problem Relation Age of Onset   Lupus Mother    Hypertension Father    Heart attack Father    Sleep apnea Brother    Heart attack Other    Sleep apnea Son    Leukemia Maternal Aunt    Other Maternal Uncle        blood disorder    Social History:  reports that she has never  smoked. She has never used smokeless tobacco. She reports current alcohol use. She reports that she does not use drugs.  Review of Systems:  HYPERTENSION:  followed by PCP/cardiologist, Not monitoring blood pressure at home:   Her drugs include Benicar 40 mg with amlodipine 5 mg Also prescribed by cardiologist  Her urine microalbumin is  normal again  BP Readings from Last 3 Encounters:  01/14/23 132/82  10/20/22 126/88  06/15/22 118/78   RENAL function: Recent history:   Lab Results  Component Value Date   CREATININE 1.14 01/11/2023   CREATININE 1.16 10/15/2022   CREATININE 1.23 (H) 06/09/2022     HYPERLIPIDEMIA: She has had elevated LDL and triglycerides, prescribed Vytorin and Lovaza, followed by PCP and cardiologist.   She was tried on Crestor but she thinks this caused leg pain  Has mild increase in triglycerides and low normal HDL  LDL last 61   Lab Results  Component Value Date   CHOL 160 04/15/2021   CHOL 120 08/14/2019   CHOL 162 05/26/2018   Lab Results  Component Value Date   HDL 38.00 (L) 04/15/2021   HDL 40.20 08/14/2019   HDL 37.70 (L) 05/26/2018   Lab Results  Component Value Date   LDLCALC 89 04/15/2021   LDLCALC 43 08/14/2019   LDLCALC 89 05/26/2018   Lab Results  Component Value Date   TRIG 167.0 (H) 04/15/2021   TRIG 186.0 (H) 08/14/2019   TRIG 178.0 (  H) 05/26/2018   Lab Results  Component Value Date   CHOLHDL 4 04/15/2021   CHOLHDL 3 08/14/2019   CHOLHDL 4 05/26/2018   Lab Results  Component Value Date   LDLDIRECT 61.0 10/15/2022   LDLDIRECT 130.0 01/18/2018   LDLDIRECT 121.2 05/08/2013     History of mild hypothyroidism, treated for several years  She is taking 112 mcg levothyroxine and her TSH is consistently normal in the last few years She feels good  Lab Results  Component Value Date   TSH 3.57 10/15/2022   TSH 1.11 01/27/2022   TSH 2.20 07/08/2021   NEUROPATHY: She has some burning in her feet at bedtime  but not later in the night     Examination:   BP 132/82   Pulse (!) 59   Ht 5' 6.5" (1.689 m)   Wt 168 lb (76.2 kg)   SpO2 96%   BMI 26.71 kg/m   Body mass index is 26.71 kg/m.    No pedal edema  ASSESSMENT/ PLAN:   Diabetes type 2   See history of present illness for  discussion of current management, blood sugar patterns and problems identified  Her A1c is 8.1   She currently on Jardiance 25 mg is not taking a GLP-1 drug her home blood sugars and A1c is higher She is likely to get Ozempic more consistently at the pharmacy now with have her go back on this She will take 0.25 mg to start with and titrate rapidly 2.5 and subsequently to 1 mg weekly  Hypertension: Well-controlled  History of microalbuminuria: This is now back to normal  Neuropathic symptoms: She is mildly symptomatic and does not feel like she needs to take any medications at this time  Patient Instructions  Start OZEMPIC injections by dialing 0.25 mg on the pen as shown once weekly on the same day of the week.  After 2 weeks increase the dose to 0.5 mg weekly  If you have any questions or persistent side effects please call the office   You may also talk to a nurse educator with [employer] at [phone] Useful website: [email or web address]        Reather Littler 01/14/2023, 1:57 PM

## 2023-01-14 NOTE — Patient Instructions (Signed)
Start OZEMPIC injections by dialing 0.25 mg on the pen as shown once weekly on the same day of the week.  After 2 weeks increase the dose to 0.5 mg weekly  If you have any questions or persistent side effects please call the office   You may also talk to a nurse educator with [employer] at [phone] Useful website: [email or web address]

## 2023-01-23 ENCOUNTER — Other Ambulatory Visit: Payer: Self-pay | Admitting: Endocrinology

## 2023-03-22 ENCOUNTER — Other Ambulatory Visit: Payer: Self-pay | Admitting: Endocrinology

## 2023-04-23 ENCOUNTER — Other Ambulatory Visit: Payer: Medicare Other

## 2023-04-27 ENCOUNTER — Other Ambulatory Visit (INDEPENDENT_AMBULATORY_CARE_PROVIDER_SITE_OTHER): Payer: Medicare Other

## 2023-04-27 DIAGNOSIS — E063 Autoimmune thyroiditis: Secondary | ICD-10-CM

## 2023-04-27 DIAGNOSIS — E1165 Type 2 diabetes mellitus with hyperglycemia: Secondary | ICD-10-CM | POA: Diagnosis not present

## 2023-04-27 LAB — BASIC METABOLIC PANEL
BUN: 30 mg/dL — ABNORMAL HIGH (ref 6–23)
CO2: 25 mEq/L (ref 19–32)
Calcium: 9.7 mg/dL (ref 8.4–10.5)
Chloride: 106 mEq/L (ref 96–112)
Creatinine, Ser: 1.14 mg/dL (ref 0.40–1.20)
GFR: 47.43 mL/min — ABNORMAL LOW (ref >60.00)
Glucose, Bld: 146 mg/dL — ABNORMAL HIGH (ref 70–99)
Potassium: 4.6 mEq/L (ref 3.5–5.1)
Sodium: 139 mEq/L (ref 135–145)

## 2023-04-27 LAB — HEMOGLOBIN A1C: Hgb A1c MFr Bld: 7.4 % — ABNORMAL HIGH (ref 4.6–6.5)

## 2023-04-27 LAB — TSH: TSH: 0.97 u[IU]/mL (ref 0.35–5.50)

## 2023-04-28 ENCOUNTER — Ambulatory Visit: Payer: Medicare Other | Admitting: Endocrinology

## 2023-04-28 ENCOUNTER — Encounter: Payer: Self-pay | Admitting: Endocrinology

## 2023-04-28 VITALS — BP 135/80 | HR 67 | Ht 66.5 in | Wt 165.8 lb

## 2023-04-28 DIAGNOSIS — E063 Autoimmune thyroiditis: Secondary | ICD-10-CM | POA: Diagnosis not present

## 2023-04-28 DIAGNOSIS — E1165 Type 2 diabetes mellitus with hyperglycemia: Secondary | ICD-10-CM

## 2023-04-28 DIAGNOSIS — I10 Essential (primary) hypertension: Secondary | ICD-10-CM

## 2023-04-28 MED ORDER — OZEMPIC (2 MG/DOSE) 8 MG/3ML ~~LOC~~ SOPN
PEN_INJECTOR | SUBCUTANEOUS | 3 refills | Status: DC
Start: 2023-04-28 — End: 2023-08-19

## 2023-04-28 NOTE — Progress Notes (Signed)
Patient ID: Crystal Hobbs, female   DOB: 23-Aug-1951, 72 y.o.   MRN: [id number]   Reason for Appointment: Endocrinology follow-up    History of Present Illness   Diagnosis: Type 2 DIABETES MELITUS, date of diagnosis:  2011   Past history: Her initial presentation with diabetes was with significant symptoms and glucose of 947 Initially was treated with insulin and subsequently on metformin which she could not tolerate  Since she was not responding to Victoza and had significant hyperglycemia she was started on basal bolus insulin regimen instead  However had required relatively large doses of insulin with inadequate control and blood sugars subsequently improved significantly with adding Victoza in 10/13  Her  A1c was 6.4 in 4/14 and she had overall good control with minimal hypoglycemia She was taken off  Afrezza because of her high out-of-pocket expense and she was tried on V-go pump which she had some difficulties getting this through the pharmacy  Previous insulin settings on OMNIPOD insulin pump started on 08/16/2018: BASAL rates 1.6 midnight-7 AM, 7 AM = 1.2, 10 AM = 1.1, 2 PM = 1.2 and 7 PM = 1.6, total basal 33 units Carbohydrate coverage 1: 10.  Preset boluses 3-5 units at breakfast 4-5 at lunch and 6 units at dinner Correction 1: 50 with target 100-120   RECENT history:    Non-insulin hypoglycemic drugs: Jardiance 25 mg daily, Ozempic 0.5 mg weekly  Her A1c is improved at 7.4, was at 8.1  Historically, lowest A1c previously 6.7  Current management, blood sugar patterns and problems identified: She has not been able to increase Ozempic to 1 mg dosage since she was apparently unable to get the dose refilled at the pharmacy However she is seeing better blood sugar controls especially fasting Unable to download her meter However recently has not checked any readings after dinner which are usually higher Although she has not lost much weight she thinks this may be  because of recently being on a cruise She is back to exercising regularly with walking up to 4 miles and playing tennis Recently generally watching her diet fairly well  Physical activity: exercise: Walking up to 3 miles a day or plays tennis 3/7 days a week,             Side effects from medications:  diarrhea from metformin ? Hair loss from Actos Proper timing of medications in relation to meals: Yes         Monitors blood glucose: Mostly once a day.  Glucometer:  Contour Next .          Blood Glucose readings from monitor review:  14-day average = 120  RECENT range: 105-141  Previously:  PRE-MEAL Fasting Lunch Dinner Bedtime Overall  Glucose range: 135-180  120 130-200   Mean/median:         Diabetes education visit: Most recent: 05/2012      CDE visit 12/19        Wt Readings from Last 3 Encounters:  04/28/23 165 lb 12.8 oz (75.2 kg)  01/14/23 168 lb (76.2 kg)  10/20/22 169 lb 9.6 oz (76.9 kg)    Lab Results  Component Value Date   HGBA1C 7.4 (H) 04/27/2023   HGBA1C 8.1 (H) 01/11/2023   HGBA1C 7.5 (H) 10/15/2022   Lab Results  Component Value Date   MICROALBUR 12.5 (H) 01/11/2023   LDLCALC 89 04/15/2021   CREATININE 1.14 04/27/2023    Other problems discussed today: See review of systems  Appointment on 04/27/2023  Component Date Value Ref Range Status   TSH 04/27/2023 0.97  0.35 - 5.50 uIU/mL Final   Hgb A1c MFr Bld 04/27/2023 7.4 (H)  4.6 - 6.5 % Final   Glycemic Control Guidelines for People with Diabetes:Non Diabetic:  <6%Goal of Therapy: <7%Additional Action Suggested:  >8%    Sodium 04/27/2023 139  135 - 145 mEq/L Final   Potassium 04/27/2023 4.6  3.5 - 5.1 mEq/L Final   Chloride 04/27/2023 106  96 - 112 mEq/L Final   CO2 04/27/2023 25  19 - 32 mEq/L Final   Glucose, Bld 04/27/2023 146 (H)  70 - 99 mg/dL Final   BUN 16/06/9603 30 (H)  6 - 23 mg/dL Final   Creatinine, Ser 04/27/2023 1.14  0.40 - 1.20 mg/dL Final   GFR 54/05/8118 47.43 (L)   >60.00 mL/min Final   Calculated using the CKD-EPI Creatinine Equation (2021)   Calcium 04/27/2023 9.7  8.4 - 10.5 mg/dL Final    Allergies as of 04/28/2023       Reactions   Actos [pioglitazone]    hairloss   Metformin And Related Diarrhea   Novocain [procaine]    syncopy   Sulfa Antibiotics Itching        Medication List        Accurate as of April 28, 2023 10:32 AM. If you have any questions, ask your nurse or doctor.          amLODipine 5 MG tablet Commonly known as: NORVASC Take 1 tablet (5 mg total) by mouth daily. Please make overdue appt with Dr. Mayford Knife before anymore refills. Thank you 2nd attempt   aspirin EC 81 MG tablet Take 81 mg by mouth daily. Patient is taking 4 pills daily   Contour Next Monitor w/Device Kit Use to check blood sugar daily.   Contour Next Test test strip Generic drug: glucose blood USE TO TEST BLOOD SUGAR 7 TIMES DAILY   ezetimibe-simvastatin 10-40 MG tablet Commonly known as: VYTORIN TAKE 1 TABLET BY MOUTH DAILY AT 6 PM   Jardiance 25 MG Tabs tablet Generic drug: empagliflozin TAKE 1 TABLET(25 MG) BY MOUTH DAILY   levothyroxine 112 MCG tablet Commonly known as: SYNTHROID TAKE 1 TABLET(112 MCG) BY MOUTH DAILY   Microlet Lancets Misc USE TO CHECK BLOOD SUGAR EVERY DAY   nitroGLYCERIN 0.4 MG SL tablet Commonly known as: NITROSTAT Place 1 tablet (0.4 mg total) under the tongue every 5 (five) minutes as needed for chest pain.   olmesartan 20 MG tablet Commonly known as: BENICAR TAKE 1 TABLET(20 MG) BY MOUTH DAILY   omega-3 acid ethyl esters 1 g capsule Commonly known as: LOVAZA Take 2 g by mouth 2 (two) times daily.   Ozempic (1 MG/DOSE) 4 MG/3ML Sopn Generic drug: Semaglutide (1 MG/DOSE) Inject 1 mg weekly   Ozempic (0.25 or 0.5 MG/DOSE) 2 MG/3ML Sopn Generic drug: Semaglutide(0.25 or 0.5MG /DOS) INJECT 0.5 MG UNDER THE SKIN ONE DAY A WEEK   Ozempic (0.25 or 0.5 MG/DOSE) 2 MG/1.5ML Sopn Generic drug:  Semaglutide(0.25 or 0.5MG /DOS) INJECT 0.5 MG INTO THE SKIN ONCE A WEEK.        Allergies:  Allergies  Allergen Reactions   Actos [Pioglitazone]     hairloss   Metformin And Related Diarrhea   Novocain [Procaine]     syncopy   Sulfa Antibiotics Itching    Past Medical History:  Diagnosis Date   Allergy    Arthritis    Complication of anesthesia  takes along time to wake up   Contact lens/glasses fitting    wears glasses or contacts   Coronary artery disease 01/13/2007   s/p PCI of diagonal    Diabetes mellitus without complication (HCC)    Dyslipidemia    Heart attack (HCC)    had a blood clot, got a stent, but pt states she was told it was not from plaque   Hyperlipidemia    Hypertension    Hypothyroidism    Snores    Vertigo     Past Surgical History:  Procedure Laterality Date   CARDIAC CATHETERIZATION  09/14/2006   stent   COLONOSCOPY     rotator cuff repair Right    twice   SHOULDER ARTHROSCOPY WITH OPEN ROTATOR CUFF REPAIR Right 02/28/2013   Procedure: right shoulder arthroscopy, distal clavicle sculpting, open rotator cuff repair;  Surgeon: Wyn Forster., MD;  Location: [facility];  Service: Orthopedics;  Laterality: Right;   TUBAL LIGATION      Family History  Problem Relation Age of Onset   Lupus Mother    Hypertension Father    Heart attack Father    Sleep apnea Brother    Heart attack Other    Sleep apnea Son    Leukemia Maternal Aunt    Other Maternal Uncle        blood disorder    Social History:  reports that she has never smoked. She has never used smokeless tobacco. She reports current alcohol use. She reports that she does not use drugs.  Review of Systems:  HYPERTENSION:  followed by PCP/cardiologist, Not monitoring blood pressure at home   Her drugs include Benicar 40 mg with amlodipine 5 mg Also prescribed by cardiologist  Her urine microalbumin is  normal   BP Readings from Last 3 Encounters:   04/28/23 135/80  01/14/23 132/82  10/20/22 126/88   RENAL function: Recent history:   Lab Results  Component Value Date   CREATININE 1.14 04/27/2023   CREATININE 1.14 01/11/2023   CREATININE 1.16 10/15/2022     HYPERLIPIDEMIA: She has had elevated LDL and triglycerides, prescribed Vytorin and Lovaza, followed by her cardiologist.   She was tried on Crestor but she thinks this caused leg pain  Has mild increase in triglycerides and low normal HDL  LDL last 61   Lab Results  Component Value Date   CHOL 160 04/15/2021   CHOL 120 08/14/2019   CHOL 162 05/26/2018   Lab Results  Component Value Date   HDL 38.00 (L) 04/15/2021   HDL 40.20 08/14/2019   HDL 37.70 (L) 05/26/2018   Lab Results  Component Value Date   LDLCALC 89 04/15/2021   LDLCALC 43 08/14/2019   LDLCALC 89 05/26/2018   Lab Results  Component Value Date   TRIG 167.0 (H) 04/15/2021   TRIG 186.0 (H) 08/14/2019   TRIG 178.0 (H) 05/26/2018   Lab Results  Component Value Date   CHOLHDL 4 04/15/2021   CHOLHDL 3 08/14/2019   CHOLHDL 4 05/26/2018   Lab Results  Component Value Date   LDLDIRECT 61.0 10/15/2022   LDLDIRECT 130.0 01/18/2018   LDLDIRECT 121.2 05/08/2013     History of mild hypothyroidism, treated for several years  She is taking 112 mcg levothyroxine and her TSH is consistently normal in the last few years She feels no fatigue  Lab Results  Component Value Date   TSH 0.97 04/27/2023   TSH 3.57 10/15/2022   TSH 1.11  01/27/2022   NEUROPATHY: She has some burning in her feet at bedtime but not in the night     Examination:   BP 135/80   Pulse 67   Ht 5' 6.5" (1.689 m)   Wt 165 lb 12.8 oz (75.2 kg)   SpO2 97%   BMI 26.36 kg/m   Body mass index is 26.36 kg/m.    ASSESSMENT/ PLAN:   Diabetes type 2   See history of present illness for  discussion of current management, blood sugar patterns and problems identified  Her A1c is 7.4 compared to 8.1  She currently on  Jardiance 25 mg and Ozempic 0.5 mg weekly  With going back on Ozempic her blood sugars are generally better but A1c is not as good as expected Likely has still postprandial high readings which she is not monitoring She is trying to watch her diet and exercise  Discussed that she will likely need higher doses of Ozempic For reducing her expense since she is in the donut hole she can now get the 2 mg Ozempic device and dial halfway as show Reminded her to start checking blood sugars at night 2 hours after eating and also sometimes in the daytime  Hypertension: Well-controlled  Hypothyroidism: Well-controlled  She will discuss follow-up of her lipids with PCP since she is not able to get regular appointments with cardiologist  There are no Patient Instructions on file for this visit.     Reather Littler 04/28/2023, 10:32 AM

## 2023-04-28 NOTE — Patient Instructions (Addendum)
Check blood sugars on waking up 3 days a week  Also check blood sugars about 2 hours after meals and do this after different meals by rotation  Recommended blood sugar levels on waking up are 90-130 and about 2 hours after meal is 130-160  Please bring your blood sugar monitor to each visit, thank you   Ozempic 1mg  weekly

## 2023-04-30 ENCOUNTER — Other Ambulatory Visit: Payer: Self-pay | Admitting: Endocrinology

## 2023-04-30 DIAGNOSIS — E1165 Type 2 diabetes mellitus with hyperglycemia: Secondary | ICD-10-CM

## 2023-05-27 DIAGNOSIS — M722 Plantar fascial fibromatosis: Secondary | ICD-10-CM | POA: Diagnosis not present

## 2023-07-29 DIAGNOSIS — E1140 Type 2 diabetes mellitus with diabetic neuropathy, unspecified: Secondary | ICD-10-CM | POA: Diagnosis not present

## 2023-07-29 DIAGNOSIS — Z9181 History of falling: Secondary | ICD-10-CM | POA: Diagnosis not present

## 2023-07-29 DIAGNOSIS — E039 Hypothyroidism, unspecified: Secondary | ICD-10-CM | POA: Diagnosis not present

## 2023-07-29 DIAGNOSIS — Z0000 Encounter for general adult medical examination without abnormal findings: Secondary | ICD-10-CM | POA: Diagnosis not present

## 2023-07-29 DIAGNOSIS — I10 Essential (primary) hypertension: Secondary | ICD-10-CM | POA: Diagnosis not present

## 2023-07-29 DIAGNOSIS — Z23 Encounter for immunization: Secondary | ICD-10-CM | POA: Diagnosis not present

## 2023-07-29 DIAGNOSIS — E7800 Pure hypercholesterolemia, unspecified: Secondary | ICD-10-CM | POA: Diagnosis not present

## 2023-07-29 DIAGNOSIS — I2510 Atherosclerotic heart disease of native coronary artery without angina pectoris: Secondary | ICD-10-CM | POA: Diagnosis not present

## 2023-08-09 ENCOUNTER — Other Ambulatory Visit: Payer: Self-pay

## 2023-08-09 DIAGNOSIS — E1165 Type 2 diabetes mellitus with hyperglycemia: Secondary | ICD-10-CM

## 2023-08-10 ENCOUNTER — Other Ambulatory Visit: Payer: Medicare Other

## 2023-08-10 DIAGNOSIS — E1165 Type 2 diabetes mellitus with hyperglycemia: Secondary | ICD-10-CM | POA: Diagnosis not present

## 2023-08-11 LAB — HEMOGLOBIN A1C
Hgb A1c MFr Bld: 7 %{Hb} — ABNORMAL HIGH (ref <5.7)
Mean Plasma Glucose: 154 mg/dL
eAG (mmol/L): 8.5 mmol/L

## 2023-08-11 LAB — COMPREHENSIVE METABOLIC PANEL
AG Ratio: 1.7 (calc) (ref 1.0–2.5)
ALT: 18 U/L (ref 6–29)
AST: 20 U/L (ref 10–35)
Albumin: 4.3 g/dL (ref 3.6–5.1)
Alkaline phosphatase (APISO): 74 U/L (ref 37–153)
BUN/Creatinine Ratio: 22 (calc) (ref 6–22)
BUN: 25 mg/dL (ref 7–25)
CO2: 27 mmol/L (ref 20–32)
Calcium: 10.1 mg/dL (ref 8.6–10.4)
Chloride: 108 mmol/L (ref 98–110)
Creat: 1.15 mg/dL — ABNORMAL HIGH (ref 0.60–1.00)
Globulin: 2.6 g/dL (ref 1.9–3.7)
Glucose, Bld: 120 mg/dL — ABNORMAL HIGH (ref 65–99)
Potassium: 4.5 mmol/L (ref 3.5–5.3)
Sodium: 139 mmol/L (ref 135–146)
Total Bilirubin: 0.4 mg/dL (ref 0.2–1.2)
Total Protein: 6.9 g/dL (ref 6.1–8.1)

## 2023-08-19 ENCOUNTER — Other Ambulatory Visit: Payer: Self-pay

## 2023-08-19 ENCOUNTER — Ambulatory Visit: Payer: Medicare Other | Admitting: Endocrinology

## 2023-08-19 ENCOUNTER — Encounter: Payer: Self-pay | Admitting: Endocrinology

## 2023-08-19 VITALS — BP 122/82 | HR 62 | Resp 20 | Ht 66.5 in | Wt 163.8 lb

## 2023-08-19 DIAGNOSIS — E063 Autoimmune thyroiditis: Secondary | ICD-10-CM | POA: Diagnosis not present

## 2023-08-19 DIAGNOSIS — Z7984 Long term (current) use of oral hypoglycemic drugs: Secondary | ICD-10-CM

## 2023-08-19 DIAGNOSIS — E1165 Type 2 diabetes mellitus with hyperglycemia: Secondary | ICD-10-CM | POA: Diagnosis not present

## 2023-08-19 DIAGNOSIS — Z7985 Long-term (current) use of injectable non-insulin antidiabetic drugs: Secondary | ICD-10-CM

## 2023-08-19 MED ORDER — CONTOUR NEXT TEST VI STRP: ORAL_STRIP | 3 refills | Status: DC

## 2023-08-19 MED ORDER — OZEMPIC (2 MG/DOSE) 8 MG/3ML ~~LOC~~ SOPN: PEN_INJECTOR | SUBCUTANEOUS | 3 refills | Status: DC

## 2023-08-19 MED ORDER — EMPAGLIFLOZIN 25 MG PO TABS: 25.0000 mg | ORAL_TABLET | Freq: Every day | ORAL | 3 refills | Status: DC

## 2023-08-19 MED ORDER — LEVOTHYROXINE SODIUM 112 MCG PO TABS: ORAL_TABLET | ORAL | 3 refills | Status: DC

## 2023-08-19 NOTE — Progress Notes (Signed)
Outpatient Endocrinology Note Crystal Elin Seats, MD  08/19/23  Patient's Name: Crystal Hobbs    DOB: 1951-03-23    MRN: [id number]                                                    REASON OF VISIT: Follow up for type 2 diabetes mellitus  PCP: Merri Brunette, MD  HISTORY OF PRESENT ILLNESS:   Crystal Hobbs is a 72 y.o. old female with past medical history listed below, is here for follow up for type 2 diabetes mellitus.   Pertinent Diabetes History: Patient was previously seen by Dr. Lucianne Muss, was last time seen in August 2024.  Patient was diagnosed with type 2 diabetes mellitus in 2011.  Patient has controlled type 2 diabetes mellitus.   Chronic Diabetes Complications : Retinopathy: no. Last ophthalmology exam was done on annually, following with ophthalmology regularly.  Nephropathy: yes /CKD 3A, on ACE/ARB /olmesartan. Peripheral neuropathy: yes, on burning.  Coronary artery disease: yes, ? Stroke: no  Relevant comorbidities and cardiovascular risk factors: Obesity: no Body mass index is 26.04 kg/m.  Hypertension: Yes  Hyperlipidemia : Yes, on statin   Current / Home Diabetic regimen includes:  Jardiance 25 mg daily. Ozempic 1 mg weekly.  Prior diabetic medications: In the past she had taken Victoza, Afrezza, V-Go pump, she used to be on OmniPod insulin pump started in 2019. Diarrhea from metformin in the past.  Hair loss from ? Actos.  Glycemic data:   No glucometer data to review.  Does not have to see strips and has not been checking lately.  Hypoglycemia: Patient has no hypoglycemic episodes. Patient has hypoglycemia awareness.  Factors modifying glucose control: 1.  Diabetic diet assessment: 3 meals a day.  2.  Staying active or exercising: Regularly walking and playing tennis.  3.  Medication compliance: compliant all of the time.  # Hypothyroidism -She has been taking levothyroxine 112 mcg daily.  Normal TSH of 0.97 in August 2024.  Interval history   Diabetes regimen as noted above.  Recent hemoglobin A1c of 7% has improved.  She has no complaints today.  She has been taking levothyroxine 112 mcg daily and reports compliance.  No hypo and hyperthyroid symptoms.  REVIEW OF SYSTEMS As per history of present illness.   PAST MEDICAL HISTORY: Past Medical History:  Diagnosis Date   Allergy    Arthritis    Complication of anesthesia    takes along time to wake up   Contact lens/glasses fitting    wears glasses or contacts   Coronary artery disease 01/13/2007   s/p PCI of diagonal    Diabetes mellitus without complication (HCC)    Dyslipidemia    Heart attack (HCC)    had a blood clot, got a stent, but pt states she was told it was not from plaque   Hyperlipidemia    Hypertension    Hypothyroidism    Snores    Vertigo     PAST SURGICAL HISTORY: Past Surgical History:  Procedure Laterality Date   CARDIAC CATHETERIZATION  09/14/2006   stent   COLONOSCOPY     rotator cuff repair Right    twice   SHOULDER ARTHROSCOPY WITH OPEN ROTATOR CUFF REPAIR Right 02/28/2013   Procedure: right shoulder arthroscopy, distal clavicle sculpting, open rotator cuff repair;  Surgeon: Molly Maduro  Glenice Bow., MD;  Location: [facility];  Service: Orthopedics;  Laterality: Right;   TUBAL LIGATION      ALLERGIES: Allergies  Allergen Reactions   Actos [Pioglitazone]     hairloss   Metformin And Related Diarrhea   Novocain [Procaine]     syncopy   Sulfa Antibiotics Itching    FAMILY HISTORY:  Family History  Problem Relation Age of Onset   Lupus Mother    Hypertension Father    Heart attack Father    Sleep apnea Brother    Heart attack Other    Sleep apnea Son    Leukemia Maternal Aunt    Other Maternal Uncle        blood disorder    SOCIAL HISTORY: Social History   Socioeconomic History   Marital status: Married    Spouse name: Not on file   Number of children: Not on file   Years of education: Not on file    Highest education level: Not on file  Occupational History   Not on file  Tobacco Use   Smoking status: Never   Smokeless tobacco: Never  Substance and Sexual Activity   Alcohol use: Yes    Comment: occ   Drug use: No   Sexual activity: Not on file  Other Topics Concern   Not on file  Social History Narrative   ** Merged History Encounter **       Caffeine: 2 cups/day (diet soda)   Social Determinants of Health   Financial Resource Strain: Not on file  Food Insecurity: Not on file  Transportation Needs: Not on file  Physical Activity: Not on file  Stress: Not on file  Social Connections: Not on file    MEDICATIONS:  Current Outpatient Medications  Medication Sig Dispense Refill   amLODipine (NORVASC) 5 MG tablet Take 1 tablet (5 mg total) by mouth daily. Please make overdue appt with Dr. Mayford Knife before anymore refills. Thank you 2nd attempt 15 tablet 0   aspirin EC 81 MG tablet Take 81 mg by mouth daily. Patient is taking 4 pills daily     Blood Glucose Monitoring Suppl (CONTOUR NEXT MONITOR) w/Device KIT Use to check blood sugar daily. 1 kit 0   empagliflozin (JARDIANCE) 25 MG TABS tablet Take 1 tablet (25 mg total) by mouth daily. 90 tablet 3   ezetimibe-simvastatin (VYTORIN) 10-40 MG tablet TAKE 1 TABLET BY MOUTH DAILY AT 6 PM 15 tablet 0   glucose blood (CONTOUR NEXT TEST) test strip USE TO TEST BLOOD SUGAR 7 TIMES DAILY 300 strip 3   levothyroxine (SYNTHROID) 112 MCG tablet TAKE 1 TABLET(112 MCG) BY MOUTH DAILY 90 tablet 3   Microlet Lancets MISC USE TO CHECK BLOOD SUGAR EVERY DAY 100 each 2   nitroGLYCERIN (NITROSTAT) 0.4 MG SL tablet Place 1 tablet (0.4 mg total) under the tongue every 5 (five) minutes as needed for chest pain. 90 tablet 3   olmesartan (BENICAR) 20 MG tablet TAKE 1 TABLET(20 MG) BY MOUTH DAILY 90 tablet 1   omega-3 acid ethyl esters (LOVAZA) 1 G capsule Take 2 g by mouth 2 (two) times daily.     Semaglutide, 2 MG/DOSE, (OZEMPIC, 2 MG/DOSE,) 8 MG/3ML  SOPN Inject 2 mg weekly 9 mL 3   No current facility-administered medications for this visit.    PHYSICAL EXAM: Vitals:   08/19/23 1048  BP: 122/82  Pulse: 62  Resp: 20  SpO2: 98%  Weight: 163 lb 12.8 oz (74.3  kg)  Height: 5' 6.5" (1.689 m)   Body mass index is 26.04 kg/m.  Wt Readings from Last 3 Encounters:  08/19/23 163 lb 12.8 oz (74.3 kg)  04/28/23 165 lb 12.8 oz (75.2 kg)  01/14/23 168 lb (76.2 kg)    General: Well developed, well nourished female in no apparent distress.  HEENT: AT/[place], no external lesions.  Eyes: Conjunctiva clear and no icterus. Neck: Neck supple  Lungs: Respirations not labored Neurologic: Alert, oriented, normal speech Extremities / Skin: Dry. No sores or rashes noted.  Psychiatric: Does not appear depressed or anxious  Diabetic Foot Exam - Simple   Simple Foot Form Diabetic Foot exam was performed with the following findings: Yes 08/19/2023 11:08 AM  Visual Inspection See comments: Yes Sensation Testing Intact to touch and monofilament testing bilaterally: Yes Pulse Check See comments: Yes Comments DP 2+ bilaterally. Callus b/l laterally. No ulcer and deformity.     LABS Reviewed Lab Results  Component Value Date   HGBA1C 7.0 (H) 08/10/2023   HGBA1C 7.4 (H) 04/27/2023   HGBA1C 8.1 (H) 01/11/2023   Lab Results  Component Value Date   FRUCTOSAMINE 230 04/12/2017   FRUCTOSAMINE 245 12/01/2016   Lab Results  Component Value Date   CHOL 160 04/15/2021   HDL 38.00 (L) 04/15/2021   LDLCALC 89 04/15/2021   LDLDIRECT 61.0 10/15/2022   TRIG 167.0 (H) 04/15/2021   CHOLHDL 4 04/15/2021   Lab Results  Component Value Date   MICRALBCREAT 22.1 01/11/2023   MICRALBCREAT 24.4 01/27/2022   Lab Results  Component Value Date   CREATININE 1.15 (H) 08/10/2023   Lab Results  Component Value Date   GFR 47.43 (L) 04/27/2023    ASSESSMENT / PLAN  1. Uncontrolled type 2 diabetes mellitus with hyperglycemia, without long-term current use  of insulin (HCC)   2. Acquired autoimmune hypothyroidism     Diabetes Mellitus type 2, complicated by CKD/peripheral neuropathy. - Diabetic status / severity: Controlled, improving.  Lab Results  Component Value Date   HGBA1C 7.0 (H) 08/10/2023    - Hemoglobin A1c goal : <7%  - Medications: No change.  I) continue Jardiance 25 mg daily. II) continue Ozempic 1 mg weekly.  She has been using 2 mg Ozempic device and dial halfway.  - Home glucose testing: Check in the morning fasting daily. - Discussed/ Gave Hypoglycemia treatment plan.  # Consult : not required at this time.   # Annual urine for microalbuminuria/ creatinine ratio, no microalbuminuria currently, continue ACE/ARB /olmesartan. Last  Lab Results  Component Value Date   MICRALBCREAT 22.1 01/11/2023    # Foot check nightly / neuropath.  # Annual dilated diabetic eye exams.   - Diet: Make healthy diabetic food choices - Life style / activity / exercise: Discussed.  2. Blood pressure  -  BP Readings from Last 1 Encounters:  08/19/23 122/82    - Control is in target.  - No change in current plans.  3. Lipid status / Hyperlipidemia - Last  Lab Results  Component Value Date   LDLCALC 89 04/15/2021   - Continue on axitinib-simvastatin 10-40 mg daily.  # Hypothyroidism -Continue levothyroxine 75 mcg daily.  Will check thyroid function test in next follow-up visit.  Diagnoses and all orders for this visit:  Uncontrolled type 2 diabetes mellitus with hyperglycemia, without long-term current use of insulin (HCC) -     empagliflozin (JARDIANCE) 25 MG TABS tablet; Take 1 tablet (25 mg total) by mouth daily. -  Basic metabolic panel -     Hemoglobin A1c -     Microalbumin / creatinine urine ratio  Acquired autoimmune hypothyroidism -     T4, free -     TSH  Other orders -     levothyroxine (SYNTHROID) 112 MCG tablet; TAKE 1 TABLET(112 MCG) BY MOUTH DAILY -     Semaglutide, 2 MG/DOSE, (OZEMPIC, 2  MG/DOSE,) 8 MG/3ML SOPN; Inject 2 mg weekly -     glucose blood (CONTOUR NEXT TEST) test strip; USE TO TEST BLOOD SUGAR 7 TIMES DAILY    DISPOSITION Follow up in clinic in 4 months suggested.   All questions answered and patient verbalized understanding of the plan.  Crystal Kataleena Holsapple, MD [facility] Endocrinology [facility] [address] [place], [address] [place], [place] [address] Phone # [phone]  At least part of this note was generated using voice recognition software. Inadvertent word errors may have occurred, which were not recognized during the proofreading process.

## 2023-08-19 NOTE — Patient Instructions (Addendum)
Latest Reference Range & Units 08/10/23 08:22  Sodium 135 - 146 mmol/L 139  Potassium 3.5 - 5.3 mmol/L 4.5  Chloride 98 - 110 mmol/L 108  CO2 20 - 32 mmol/L 27  Glucose 65 - 99 mg/dL 161 (H)  Mean Plasma Glucose mg/dL 096  BUN 7 - 25 mg/dL 25  Creatinine 0.45 - 4.09 mg/dL 8.11 (H)  Calcium 8.6 - 10.4 mg/dL 91.4  BUN/Creatinine Ratio 6 - 22 (calc) 22  AG Ratio 1.0 - 2.5 (calc) 1.7  AST 10 - 35 U/L 20  ALT 6 - 29 U/L 18  Total Protein 6.1 - 8.1 g/dL 6.9  Total Bilirubin 0.2 - 1.2 mg/dL 0.4  (H): Data is abnormally high   Latest Reference Range & Units 08/10/23 08:22  Alkaline phosphatase (APISO) 37 - 153 U/L 74  Globulin 1.9 - 3.7 g/dL (calc) 2.6     Latest Reference Range & Units 08/10/23 08:22  eAG (mmol/L) mmol/L 8.5  Glucose 65 - 99 mg/dL 782 (H)  Hemoglobin N5A <5.7 % of total Hgb 7.0 (H)  Albumin MSPROF 3.6 - 5.1 g/dL 4.3  (H): Data is abnormally high

## 2023-08-23 ENCOUNTER — Ambulatory Visit: Payer: Medicare Other | Admitting: Endocrinology

## 2023-09-23 DIAGNOSIS — H04123 Dry eye syndrome of bilateral lacrimal glands: Secondary | ICD-10-CM | POA: Diagnosis not present

## 2023-09-23 DIAGNOSIS — H02834 Dermatochalasis of left upper eyelid: Secondary | ICD-10-CM | POA: Diagnosis not present

## 2023-09-23 DIAGNOSIS — H02822 Cysts of right lower eyelid: Secondary | ICD-10-CM | POA: Diagnosis not present

## 2023-09-23 DIAGNOSIS — H43822 Vitreomacular adhesion, left eye: Secondary | ICD-10-CM | POA: Diagnosis not present

## 2023-09-23 DIAGNOSIS — H1045 Other chronic allergic conjunctivitis: Secondary | ICD-10-CM | POA: Diagnosis not present

## 2023-09-23 DIAGNOSIS — H2513 Age-related nuclear cataract, bilateral: Secondary | ICD-10-CM | POA: Diagnosis not present

## 2023-09-23 DIAGNOSIS — E119 Type 2 diabetes mellitus without complications: Secondary | ICD-10-CM | POA: Diagnosis not present

## 2023-09-23 DIAGNOSIS — H02831 Dermatochalasis of right upper eyelid: Secondary | ICD-10-CM | POA: Diagnosis not present

## 2023-10-20 DIAGNOSIS — Z03818 Encounter for observation for suspected exposure to other biological agents ruled out: Secondary | ICD-10-CM | POA: Diagnosis not present

## 2023-10-20 DIAGNOSIS — R509 Fever, unspecified: Secondary | ICD-10-CM | POA: Diagnosis not present

## 2023-10-20 DIAGNOSIS — J209 Acute bronchitis, unspecified: Secondary | ICD-10-CM | POA: Diagnosis not present

## 2023-10-22 ENCOUNTER — Encounter: Payer: Self-pay | Admitting: Cardiology

## 2023-10-22 ENCOUNTER — Other Ambulatory Visit: Payer: Self-pay

## 2023-10-22 ENCOUNTER — Ambulatory Visit: Payer: Medicare Other | Attending: Cardiology | Admitting: Cardiology

## 2023-10-22 VITALS — BP 122/66 | HR 65 | Ht 66.0 in | Wt 161.0 lb

## 2023-10-22 DIAGNOSIS — E7800 Pure hypercholesterolemia, unspecified: Secondary | ICD-10-CM | POA: Diagnosis not present

## 2023-10-22 DIAGNOSIS — I2510 Atherosclerotic heart disease of native coronary artery without angina pectoris: Secondary | ICD-10-CM

## 2023-10-22 DIAGNOSIS — Z79899 Other long term (current) drug therapy: Secondary | ICD-10-CM | POA: Diagnosis not present

## 2023-10-22 DIAGNOSIS — I10 Essential (primary) hypertension: Secondary | ICD-10-CM

## 2023-10-22 MED ORDER — EZETIMIBE 10 MG PO TABS: 10.0000 mg | ORAL_TABLET | Freq: Every day | ORAL | 3 refills | Status: DC

## 2023-10-22 MED ORDER — ATORVASTATIN CALCIUM 40 MG PO TABS: 40.0000 mg | ORAL_TABLET | Freq: Every day | ORAL | 3 refills | Status: DC

## 2023-10-22 NOTE — Patient Instructions (Signed)
 Medication Instructions:  Please STOP vytorin .  Please START atorvastatin  40 mg daily. Most people take this medication at bedtime.  Please START zetia  10 mg daily.   *If you need a refill on your cardiac medications before your next appointment, please call your pharmacy*   Lab Work: Please complete a FASTING lipid panel and an ALT at any [facility] on or around 12/03/23.   If you have labs (blood work) drawn today and your tests are completely normal, you will receive your results only by: MyChart Message (if you have MyChart) OR A paper copy in the mail If you have any lab test that is abnormal or we need to change your treatment, we will call you to review the results.   Testing/Procedures: None.   Follow-Up:   Your next appointment:   1 year(s)  Provider:   Dr. Wilbert Bihari, MD

## 2023-10-22 NOTE — Addendum Note (Signed)
 Addended by: Cherylyn Cos on: 10/22/2023 01:19 PM   Modules accepted: Orders

## 2023-10-22 NOTE — Progress Notes (Signed)
 Cardiology CONSULT Note    Date:  10/22/2023   ID:  Crystal Hobbs, DOB Apr 12, 1951, MRN [id number]  PCP:  Sharps Pellet, MD  Cardiologist:  Wilbert Bihari, MD   Chief Complaint  Patient presents with   New Patient (Initial Visit)    ASCAD    Patient Profile: Crystal Hobbs is a 73 y.o. female who is being seen today for the evaluation of ASCAD at the request of Sharps Pellet, MD.  History of Present Illness:  Crystal Hobbs is a 73 y.o. female who is being seen today for the evaluation of ASCAD at the request of Sharps Pellet, MD.   The patient has a history of hypertension, hypothyroidism, hyperlipidemia and CAD.  She has been on olmesartan  40 mg daily and amlodipine  5 mg daily for hypertension.  She was remotely seen by me back in 2020 but was lost to follow-up.  She has a history of ASCAD status post cath in 2008 with BMS to D1.  She is now referred back by her PCP to reestablish cardiac care.  She is here today for followup and is doing well.  She denies any chest pain or pressure, SOB, DOE, PND, orthopnea, LE edema, dizziness, palpitations or syncope. She is compliant with her meds and is tolerating meds with no SE.    Past Medical History:  Diagnosis Date   Allergy    Arthritis    Atherosclerotic heart disease    Complication of anesthesia    takes along time to wake up   Contact lens/glasses fitting    wears glasses or contacts   Coronary artery disease 01/13/2007   s/p PCI of diagonal    COVID    Diabetes mellitus without complication (HCC)    Dyslipidemia    Heart attack (HCC)    had a blood clot, got a stent, but pt states she was told it was not from plaque   Hemorrhoids    History of UTI    Hyperlipidemia    Hypertension    Hypothyroidism    Obesity    Snores    Vertigo     Past Surgical History:  Procedure Laterality Date   CARDIAC CATHETERIZATION  09/14/2006   stent   COLONOSCOPY     rotator cuff repair Right    twice   SHOULDER ARTHROSCOPY  WITH OPEN ROTATOR CUFF REPAIR Right 02/28/2013   Procedure: right shoulder arthroscopy, distal clavicle sculpting, open rotator cuff repair;  Surgeon: Lamar LULLA Leonor Mickey., MD;  Location: [facility];  Service: Orthopedics;  Laterality: Right;   TUBAL LIGATION      Current Medications: Current Meds  Medication Sig   aspirin  EC 81 MG tablet Take 81 mg by mouth daily. Patient is taking 4 pills daily   empagliflozin  (JARDIANCE ) 25 MG TABS tablet Take 1 tablet (25 mg total) by mouth daily.   ezetimibe -simvastatin  (VYTORIN ) 10-40 MG tablet TAKE 1 TABLET BY MOUTH DAILY AT 6 PM   levothyroxine  (SYNTHROID ) 112 MCG tablet TAKE 1 TABLET(112 MCG) BY MOUTH DAILY   nitroGLYCERIN  (NITROSTAT ) 0.4 MG SL tablet Place 1 tablet (0.4 mg total) under the tongue every 5 (five) minutes as needed for chest pain.   olmesartan  (BENICAR ) 20 MG tablet TAKE 1 TABLET(20 MG) BY MOUTH DAILY   omega-3 acid ethyl esters (LOVAZA) 1 G capsule Take 2 g by mouth 2 (two) times daily.   Semaglutide , 2 MG/DOSE, (OZEMPIC , 2 MG/DOSE,) 8 MG/3ML SOPN Inject 2 mg  weekly    Allergies:   Actos [pioglitazone], Metformin and related, Novocain [procaine], and Sulfa antibiotics   Social History   Socioeconomic History   Marital status: Married    Spouse name: Not on file   Number of children: Not on file   Years of education: Not on file   Highest education level: Not on file  Occupational History   Not on file  Tobacco Use   Smoking status: Never   Smokeless tobacco: Never  Substance and Sexual Activity   Alcohol use: Yes    Comment: occ   Drug use: No   Sexual activity: Not on file  Other Topics Concern   Not on file  Social History Narrative   ** Merged History Encounter **       Caffeine: 2 cups/day (diet soda)   Social Drivers of Corporate Investment Banker Strain: Not on file  Food Insecurity: Not on file  Transportation Needs: Not on file  Physical Activity: Not on file  Stress: Not on file   Social Connections: Not on file     Family History:  The patient's family history includes Atrial fibrillation in her sister; CAD in her brother; Diabetes in her brother, paternal aunt, and paternal uncle; Heart attack in her father and another family member; Hypertension in her brother, father, and sister; Leukemia in her maternal aunt; Lupus in her mother; Other in her maternal uncle; Sleep apnea in her brother and son.   ROS:   Please see the history of present illness.    ROS All other systems reviewed and are negative.      No data to display             PHYSICAL EXAM:   VS:  BP 122/66   Pulse 65   Ht 5' 6 (1.676 m)   Wt 161 lb (73 kg)   BMI 25.99 kg/m    GEN: Well nourished, well developed, in no acute distress  HEENT: normal  Neck: no JVD, carotid bruits, or masses Cardiac: RRR; no murmurs, rubs, or gallops,no edema.  Intact distal pulses bilaterally. Occasional ectopy Respiratory:  clear to auscultation bilaterally, normal work of breathing GI: soft, nontender, nondistended, + BS MS: no deformity or atrophy  Skin: warm and dry, no rash Neuro:  Alert and Oriented x 3, Strength and sensation are intact Psych: euthymic mood, full affect  Wt Readings from Last 3 Encounters:  10/22/23 161 lb (73 kg)  08/19/23 163 lb 12.8 oz (74.3 kg)  04/28/23 165 lb 12.8 oz (75.2 kg)      Studies/Labs Reviewed:   EKG Interpretation Date/Time:  Friday October 22 2023 12:50:59 EST Ventricular Rate:  65 PR Interval:  160 QRS Duration:  82 QT Interval:  376 QTC Calculation: 391 R Axis:   -35  Text Interpretation: Normal sinus rhythm Left axis deviation Low voltage QRS Septal infarct , age undetermined When compared with ECG of 04-May-2016 18:40, PREVIOUS ECG IS PRESENT Confirmed by Shlomo Corning [phone]) on 10/22/2023 1:03:11 PM   EKG Interpretation Date/Time:  Friday October 22 2023 12:50:59 EST Ventricular Rate:  65 PR Interval:  160 QRS Duration:  82 QT  Interval:  376 QTC Calculation: 391 R Axis:   -35  Text Interpretation: Normal sinus rhythm Left axis deviation Low voltage QRS Septal infarct , age undetermined When compared with ECG of 04-May-2016 18:40, PREVIOUS ECG IS PRESENT Confirmed by Shlomo Corning ([id number]) on 10/22/2023 1:03:11 PM      Recent Labs:  04/27/2023: TSH 0.97 08/10/2023: ALT 18; BUN 25; Creat 1.15; Potassium 4.5; Sodium 139   Lipid Panel    Component Value Date/Time   CHOL 160 04/15/2021 0929   CHOL 120 11/08/2017 0851   TRIG 167.0 (H) 04/15/2021 0929   HDL 38.00 (L) 04/15/2021 0929   HDL 45 11/08/2017 0851   CHOLHDL 4 04/15/2021 0929   VLDL 33.4 04/15/2021 0929   LDLCALC 89 04/15/2021 0929   LDLCALC 55 11/08/2017 0851   LDLDIRECT 61.0 10/15/2022 0837      ASSESSMENT:    1. Coronary artery disease involving native coronary artery of native heart without angina pectoris   2. Hypertension, unspecified type   3. Pure hypercholesterolemia      PLAN:  In order of problems listed above:  ASCAD -s/p cath in 2008 with BMS to D - she denies any anginal symptoms -Continue prescription drug management with aspirin  81 mg daily and statin therapy  Hypertension -BP adequately controlled on exam -Continue prescription drug management with amlodipine  5 mg daily and olmesartan  20 mg daily with as needed refills -I have personally reviewed and interpreted outside labs p -refilled amlodipine  for 1 year  Hyperlipidemia -LDL goal less than 70 -I have personally reviewed and interpreted outside labs performed by patient's PCP which showed LDL 73 and HDL 45 triglycerides 170 on 07/29/2023 -I have recommended stopping Vytorin  and changing to atorvastatin  40 mg daily and Zetia  10 mg daily -Repeat FLP and ALT in 6 weeks  Time Spent: 20 minutes total time of encounter, including 15 minutes spent in face-to-face patient care on the date of this encounter. This time includes coordination of care and counseling regarding  above mentioned problem list. Remainder of non-face-to-face time involved reviewing chart documents/testing relevant to the patient encounter and documentation in the medical record. I have independently reviewed documentation from referring provider  Followup:  1 year  Medication Adjustments/Labs and Tests Ordered: Current medicines are reviewed at length with the patient today.  Concerns regarding medicines are outlined above.  Medication changes, Labs and Tests ordered today are listed in the Patient Instructions below.  There are no Patient Instructions on file for this visit.   Signed, Wilbert Bihari, MD  10/22/2023 1:06 PM    [facility] [address] [place], [place], [place]  [address] Phone: [phone]; Fax: [phone]

## 2023-11-03 DIAGNOSIS — J40 Bronchitis, not specified as acute or chronic: Secondary | ICD-10-CM | POA: Diagnosis not present

## 2023-11-08 ENCOUNTER — Other Ambulatory Visit: Payer: Self-pay | Admitting: Medical Genetics

## 2023-11-15 ENCOUNTER — Other Ambulatory Visit (HOSPITAL_COMMUNITY): Payer: Self-pay | Attending: Medical Genetics

## 2023-11-24 DIAGNOSIS — J300 Vasomotor rhinitis: Secondary | ICD-10-CM | POA: Diagnosis not present

## 2023-11-24 DIAGNOSIS — H9193 Unspecified hearing loss, bilateral: Secondary | ICD-10-CM | POA: Diagnosis not present

## 2023-12-15 ENCOUNTER — Other Ambulatory Visit: Payer: Self-pay

## 2023-12-20 ENCOUNTER — Other Ambulatory Visit: Payer: Medicare Other

## 2023-12-20 DIAGNOSIS — E063 Autoimmune thyroiditis: Secondary | ICD-10-CM | POA: Diagnosis not present

## 2023-12-20 DIAGNOSIS — E1165 Type 2 diabetes mellitus with hyperglycemia: Secondary | ICD-10-CM | POA: Diagnosis not present

## 2023-12-21 LAB — BASIC METABOLIC PANEL WITH GFR
BUN/Creatinine Ratio: 21 (calc) (ref 6–22)
BUN: 21 mg/dL (ref 7–25)
CO2: 27 mmol/L (ref 20–32)
Calcium: 10.2 mg/dL (ref 8.6–10.4)
Chloride: 109 mmol/L (ref 98–110)
Creat: 1.01 mg/dL — ABNORMAL HIGH (ref 0.60–1.00)
Glucose, Bld: 123 mg/dL — ABNORMAL HIGH (ref 65–99)
Potassium: 4.5 mmol/L (ref 3.5–5.3)
Sodium: 141 mmol/L (ref 135–146)
eGFR: 58 mL/min/{1.73_m2} — ABNORMAL LOW (ref >=60)

## 2023-12-21 LAB — HEMOGLOBIN A1C
Hgb A1c MFr Bld: 6.9 %{Hb} — ABNORMAL HIGH (ref <5.7)
Mean Plasma Glucose: 151 mg/dL
eAG (mmol/L): 8.4 mmol/L

## 2023-12-21 LAB — T4, FREE: Free T4: 1.5 ng/dL (ref 0.8–1.8)

## 2023-12-21 LAB — TSH: TSH: 1.64 m[IU]/L (ref 0.40–4.50)

## 2023-12-22 ENCOUNTER — Encounter: Payer: Self-pay | Admitting: Endocrinology

## 2023-12-23 ENCOUNTER — Ambulatory Visit: Payer: Medicare Other | Admitting: Endocrinology

## 2023-12-23 ENCOUNTER — Encounter: Payer: Self-pay | Admitting: Endocrinology

## 2023-12-23 VITALS — BP 126/88 | HR 69 | Resp 16 | Ht 66.0 in | Wt 160.2 lb

## 2023-12-23 DIAGNOSIS — Z7984 Long term (current) use of oral hypoglycemic drugs: Secondary | ICD-10-CM | POA: Diagnosis not present

## 2023-12-23 DIAGNOSIS — Z7985 Long-term (current) use of injectable non-insulin antidiabetic drugs: Secondary | ICD-10-CM

## 2023-12-23 DIAGNOSIS — E1165 Type 2 diabetes mellitus with hyperglycemia: Secondary | ICD-10-CM

## 2023-12-23 DIAGNOSIS — E063 Autoimmune thyroiditis: Secondary | ICD-10-CM | POA: Diagnosis not present

## 2023-12-23 MED ORDER — EMPAGLIFLOZIN 10 MG PO TABS: 10.0000 mg | ORAL_TABLET | Freq: Every day | ORAL | 3 refills | Status: AC

## 2023-12-23 MED ORDER — LEVOTHYROXINE SODIUM 112 MCG PO TABS: ORAL_TABLET | ORAL | 3 refills | Status: AC

## 2023-12-23 NOTE — Patient Instructions (Addendum)
 Latest Reference Range & Units 04/27/23 13:05 08/10/23 08:22 12/20/23 09:15  Hemoglobin A1C <5.7 % of total Hgb 7.4 (H) 7.0 (H) 6.9 (H)  (H): Data is abnormally high   Current dose of Ozempic.  Decrease Jardiance to 10 mg daily.  For now he can take half tablet of 25 mg until complete.  Due to frequent UTI and yeast infection.

## 2023-12-23 NOTE — Progress Notes (Signed)
 Outpatient Endocrinology Note [place] Labrian Torregrossa, MD  12/23/23  Patient's Name: Crystal Hobbs    DOB: [birth year]    MRN: [id number]                                                    REASON OF VISIT: Follow up for type 2 diabetes mellitus / hypothyroidism  PCP: Merri Brunette, MD  HISTORY OF PRESENT ILLNESS:   Crystal Hobbs is a 73 y.o. old female with past medical history listed below, is here for follow up for type 2 diabetes mellitus / hypothyroidism.  Pertinent Diabetes History: Patient was previously seen by Dr. Lucianne Muss, was last time seen in August 2024.  Patient was diagnosed with type 2 diabetes mellitus in 2011.  Patient has controlled type 2 diabetes mellitus.  Chronic Diabetes Complications : Retinopathy: no. Last ophthalmology exam was done on annually, following with ophthalmology regularly.  Nephropathy: yes /CKD 3A, on ACE/ARB /olmesartan. Peripheral neuropathy: yes, on burning.  Coronary artery disease: yes, ? Stroke: no  Relevant comorbidities and cardiovascular risk factors: Obesity: no Body mass index is 25.86 kg/m.  Hypertension: Yes  Hyperlipidemia : Yes, on statin   Current / Home Diabetic regimen includes:  Jardiance 25 mg daily. Ozempic 1 mg weekly.  Prior diabetic medications: In the past she had taken Victoza, Afrezza, V-Go pump, she used to be on OmniPod insulin pump started in 2019. Diarrhea from metformin in the past.  Hair loss from ? Actos.  Glycemic data:   Glucometer data download from March 28 to December 31, 2023 reviewed, average blood sugar 123.  She has been taking different times of the day mostly in the morning fasting, some of the blood sugar 123, 144, 94, 117, 110, 126, 131.  Hypoglycemia: Patient has no hypoglycemic episodes. Patient has hypoglycemia awareness.  Factors modifying glucose control: 1.  Diabetic diet assessment: 3 meals a day.  2.  Staying active or exercising: Regularly walking and playing tennis.  3.  Medication  compliance: compliant all of the time.  # Hypothyroidism -She has been taking levothyroxine 112 mcg daily.   Interval history  Glucometer data as reviewed above.  Hemoglobin A1c 6.9%.  Patient reports she is recently having allergic reaction and took antihistamine and she felt increased blood sugar.  Patient reports she also got recent UTI and has not been taking Jardiance for about 3 weeks.  She reports she gets UTI every few months along with vaginal yeast infection.  She had taken fluconazole at that time.  She has been taking levothyroxine 112 mcg daily, taking in the morning before breakfast and denies hypo and hyperthyroid symptoms.  Recent laboratory results reviewed, normal electrolytes, stable renal function.  Normal thyroid function test.   Latest Reference Range & Units 12/20/23 09:15  TSH 0.40 - 4.50 mIU/L 1.64  T4,Free(Direct) 0.8 - 1.8 ng/dL 1.5    REVIEW OF SYSTEMS As per history of present illness.   PAST MEDICAL HISTORY: Past Medical History:  Diagnosis Date   Allergy    Arthritis    Atherosclerotic heart disease    Complication of anesthesia    takes along time to wake up   Contact lens/glasses fitting    wears glasses or contacts   Coronary artery disease 01/13/2007   s/p PCI of diagonal    COVID  Diabetes mellitus without complication (HCC)    Dyslipidemia    Heart attack (HCC)    had a blood clot, got a stent, but pt states she was told it was not from plaque   Hemorrhoids    History of UTI    Hyperlipidemia    Hypertension    Hypothyroidism    Obesity    Snores    Vertigo     PAST SURGICAL HISTORY: Past Surgical History:  Procedure Laterality Date   CARDIAC CATHETERIZATION  09/14/2006   stent   COLONOSCOPY     rotator cuff repair Right    twice   SHOULDER ARTHROSCOPY WITH OPEN ROTATOR CUFF REPAIR Right 02/28/2013   Procedure: right shoulder arthroscopy, distal clavicle sculpting, open rotator cuff repair;  Surgeon: Wyn Forster.,  MD;  Location: [facility];  Service: Orthopedics;  Laterality: Right;   TUBAL LIGATION      ALLERGIES: Allergies  Allergen Reactions   Actos [Pioglitazone]     hairloss   Metformin And Related Diarrhea   Novocain [Procaine]     syncopy   Sulfa Antibiotics Itching    FAMILY HISTORY:  Family History  Problem Relation Age of Onset   Lupus Mother    Hypertension Father    Heart attack Father    Hypertension Sister    Atrial fibrillation Sister    Diabetes Brother    Hypertension Brother    Sleep apnea Brother    CAD Brother    Sleep apnea Son    Leukemia Maternal Aunt    Other Maternal Uncle        blood disorder   Heart attack Other    Diabetes Paternal Aunt    Diabetes Paternal Uncle     SOCIAL HISTORY: Social History   Socioeconomic History   Marital status: Married    Spouse name: Not on file   Number of children: Not on file   Years of education: Not on file   Highest education level: Not on file  Occupational History   Not on file  Tobacco Use   Smoking status: Never   Smokeless tobacco: Never  Substance and Sexual Activity   Alcohol use: Yes    Comment: occ   Drug use: No   Sexual activity: Not on file  Other Topics Concern   Not on file  Social History Narrative   ** Merged History Encounter **       Caffeine: 2 cups/day (diet soda)   Social Drivers of Corporate investment banker Strain: Not on file  Food Insecurity: Not on file  Transportation Needs: Not on file  Physical Activity: Not on file  Stress: Not on file  Social Connections: Not on file    MEDICATIONS:  Current Outpatient Medications  Medication Sig Dispense Refill   amLODipine (NORVASC) 5 MG tablet Take 1 tablet (5 mg total) by mouth daily. Please make overdue appt with Dr. Mayford Knife before anymore refills. Thank you 2nd attempt 15 tablet 0   aspirin EC 81 MG tablet Take 81 mg by mouth daily. Patient is taking 4 pills daily     atorvastatin (LIPITOR) 40 MG tablet  Take 1 tablet (40 mg total) by mouth daily. 90 tablet 3   Blood Glucose Monitoring Suppl (CONTOUR NEXT MONITOR) w/Device KIT Use to check blood sugar daily. 1 kit 0   ezetimibe (ZETIA) 10 MG tablet Take 1 tablet (10 mg total) by mouth daily. 90 tablet 3   glucose blood (  CONTOUR NEXT TEST) test strip USE TO TEST BLOOD SUGAR 7 TIMES DAILY 300 strip 3   nitroGLYCERIN (NITROSTAT) 0.4 MG SL tablet Place 1 tablet (0.4 mg total) under the tongue every 5 (five) minutes as needed for chest pain. 90 tablet 3   olmesartan (BENICAR) 20 MG tablet TAKE 1 TABLET(20 MG) BY MOUTH DAILY 90 tablet 1   omega-3 acid ethyl esters (LOVAZA) 1 G capsule Take 2 g by mouth 2 (two) times daily.     Semaglutide, 2 MG/DOSE, (OZEMPIC, 2 MG/DOSE,) 8 MG/3ML SOPN Inject 2 mg weekly (Patient taking differently: Inject 1 mg weekly) 9 mL 3   empagliflozin (JARDIANCE) 10 MG TABS tablet Take 1 tablet (10 mg total) by mouth daily. 90 tablet 3   levothyroxine (SYNTHROID) 112 MCG tablet TAKE 1 TABLET(112 MCG) BY MOUTH DAILY 90 tablet 3   No current facility-administered medications for this visit.    PHYSICAL EXAM: Vitals:   12/23/23 0917  BP: 126/88  Pulse: 69  Resp: 16  SpO2: 99%  Weight: 160 lb 3.2 oz (72.7 kg)  Height: 5\' 6"  (1.676 m)    Body mass index is 25.86 kg/m.  Wt Readings from Last 3 Encounters:  12/23/23 160 lb 3.2 oz (72.7 kg)  10/22/23 161 lb (73 kg)  08/19/23 163 lb 12.8 oz (74.3 kg)    General: Well developed, well nourished female in no apparent distress.  HEENT: AT/[place], no external lesions.  Eyes: Conjunctiva clear and no icterus. Neck: Neck supple  Lungs: Respirations not labored Neurologic: Alert, oriented, normal speech Extremities / Skin: Dry.   Psychiatric: Does not appear depressed or anxious  Diabetic Foot Exam - Simple   No data filed    LABS Reviewed Lab Results  Component Value Date   HGBA1C 6.9 (H) 12/20/2023   HGBA1C 7.0 (H) 08/10/2023   HGBA1C 7.4 (H) 04/27/2023   Lab  Results  Component Value Date   FRUCTOSAMINE 230 04/12/2017   FRUCTOSAMINE 245 12/01/2016   Lab Results  Component Value Date   CHOL 160 04/15/2021   HDL 38.00 (L) 04/15/2021   LDLCALC 89 04/15/2021   LDLDIRECT 61.0 10/15/2022   TRIG 167.0 (H) 04/15/2021   CHOLHDL 4 04/15/2021   Lab Results  Component Value Date   MICRALBCREAT 22.1 01/11/2023   MICRALBCREAT 24.4 01/27/2022   Lab Results  Component Value Date   CREATININE 1.01 (H) 12/20/2023   Lab Results  Component Value Date   GFR 47.43 (L) 04/27/2023    ASSESSMENT / PLAN  1. Uncontrolled type 2 diabetes mellitus with hyperglycemia, without long-term current use of insulin (HCC)   2. Acquired autoimmune hypothyroidism    Diabetes Mellitus type 2, complicated by CKD/peripheral neuropathy. - Diabetic status / severity: Fair control, improving.  Lab Results  Component Value Date   HGBA1C 6.9 (H) 12/20/2023    - Hemoglobin A1c goal : <6.5% She reports frequent UTI and vaginal yeast injection every few months.  Discussed to consider stopping Jardiance altogether.  Patient prefers to stay on it due to its benefit especially for CKD.  Will decrease the dose of Jardiance, if she still gets UTI and vaginal infection will stop completely in the future.  - Medications: No change.  I) decrease Jardiance 25 mg daily to 10 mg daily. II) continue Ozempic 1 mg weekly.  She has been using 2 mg Ozempic device and dial halfway.  - Home glucose testing: Check in the morning fasting daily. - Discussed/ Gave Hypoglycemia treatment plan.  #  Consult : not required at this time.   # Annual urine for microalbuminuria/ creatinine ratio, no microalbuminuria currently, continue ACE/ARB /olmesartan.  Follow-up urine microalbumin creatinine collected today. Last  Lab Results  Component Value Date   MICRALBCREAT 22.1 01/11/2023    # Foot check nightly / neuropathy.  # Annual dilated diabetic eye exams.   - Diet: Make healthy  diabetic food choices - Life style / activity / exercise: Discussed.  2. Blood pressure  -  BP Readings from Last 1 Encounters:  12/23/23 126/88    - Control is in target.  - No change in current plans.  3. Lipid status / Hyperlipidemia - Last  Lab Results  Component Value Date   LDLCALC 89 04/15/2021   - Continue o atorvastatin 40 mg daily and Zetia 10 mg daily.  Managed by primary care provider.  # Hypothyroidism -Patient had a function test normal.  Continue current dose of levothyroxine. -Continue levothyroxine 112 mcg daily.   -Annual thyroid lab.  Diagnoses and all orders for this visit:  Uncontrolled type 2 diabetes mellitus with hyperglycemia, without long-term current use of insulin (HCC) -     empagliflozin (JARDIANCE) 10 MG TABS tablet; Take 1 tablet (10 mg total) by mouth daily.  Acquired autoimmune hypothyroidism -     levothyroxine (SYNTHROID) 112 MCG tablet; TAKE 1 TABLET(112 MCG) BY MOUTH DAILY   DISPOSITION Follow up in clinic in 3 months suggested.   All questions answered and patient verbalized understanding of the plan.  Iraq Jezreel Sisk, MD [facility] [address] [place], [address] [place], [place] [address] Phone # [phone]  At least part of this note was generated using voice recognition software. Inadvertent word errors may have occurred, which were not recognized during the proofreading process.

## 2023-12-24 LAB — MICROALBUMIN / CREATININE URINE RATIO
Creatinine, Urine: 46 mg/dL (ref 20–275)
Microalb Creat Ratio: 115 mg/g{creat} — ABNORMAL HIGH (ref <30)
Microalb, Ur: 5.3 mg/dL

## 2024-01-25 DIAGNOSIS — E119 Type 2 diabetes mellitus without complications: Secondary | ICD-10-CM | POA: Diagnosis not present

## 2024-01-25 DIAGNOSIS — M069 Rheumatoid arthritis, unspecified: Secondary | ICD-10-CM | POA: Diagnosis not present

## 2024-01-25 DIAGNOSIS — H43822 Vitreomacular adhesion, left eye: Secondary | ICD-10-CM | POA: Diagnosis not present

## 2024-01-25 DIAGNOSIS — H1045 Other chronic allergic conjunctivitis: Secondary | ICD-10-CM | POA: Diagnosis not present

## 2024-01-25 DIAGNOSIS — H04123 Dry eye syndrome of bilateral lacrimal glands: Secondary | ICD-10-CM | POA: Diagnosis not present

## 2024-01-25 DIAGNOSIS — H2513 Age-related nuclear cataract, bilateral: Secondary | ICD-10-CM | POA: Diagnosis not present

## 2024-01-25 DIAGNOSIS — H43813 Vitreous degeneration, bilateral: Secondary | ICD-10-CM | POA: Diagnosis not present

## 2024-01-26 DIAGNOSIS — H903 Sensorineural hearing loss, bilateral: Secondary | ICD-10-CM | POA: Diagnosis not present

## 2024-01-26 DIAGNOSIS — H9313 Tinnitus, bilateral: Secondary | ICD-10-CM | POA: Diagnosis not present

## 2024-01-26 DIAGNOSIS — J300 Vasomotor rhinitis: Secondary | ICD-10-CM | POA: Diagnosis not present

## 2024-01-27 DIAGNOSIS — I2510 Atherosclerotic heart disease of native coronary artery without angina pectoris: Secondary | ICD-10-CM | POA: Diagnosis not present

## 2024-01-27 DIAGNOSIS — E039 Hypothyroidism, unspecified: Secondary | ICD-10-CM | POA: Diagnosis not present

## 2024-01-27 DIAGNOSIS — I10 Essential (primary) hypertension: Secondary | ICD-10-CM | POA: Diagnosis not present

## 2024-01-27 DIAGNOSIS — E1169 Type 2 diabetes mellitus with other specified complication: Secondary | ICD-10-CM | POA: Diagnosis not present

## 2024-01-27 DIAGNOSIS — E7800 Pure hypercholesterolemia, unspecified: Secondary | ICD-10-CM | POA: Diagnosis not present

## 2024-03-06 ENCOUNTER — Ambulatory Visit: Admitting: Endocrinology

## 2024-03-06 ENCOUNTER — Encounter: Payer: Self-pay | Admitting: Endocrinology

## 2024-03-06 VITALS — BP 158/80 | HR 69 | Resp 20 | Ht 66.0 in | Wt 158.6 lb

## 2024-03-06 DIAGNOSIS — E063 Autoimmune thyroiditis: Secondary | ICD-10-CM

## 2024-03-06 DIAGNOSIS — Z7985 Long-term (current) use of injectable non-insulin antidiabetic drugs: Secondary | ICD-10-CM | POA: Diagnosis not present

## 2024-03-06 DIAGNOSIS — E1165 Type 2 diabetes mellitus with hyperglycemia: Secondary | ICD-10-CM | POA: Diagnosis not present

## 2024-03-06 DIAGNOSIS — Z7984 Long term (current) use of oral hypoglycemic drugs: Secondary | ICD-10-CM | POA: Diagnosis not present

## 2024-03-06 NOTE — Progress Notes (Signed)
 Outpatient Endocrinology Note [place] Deseree Zemaitis, MD  03/06/24  Patient's Name: Crystal Hobbs    DOB: 07-18-1951    MRN: [id number]                                                    REASON OF VISIT: Follow up for type 2 diabetes mellitus / hypothyroidism  PCP: Sharps Pellet, MD  HISTORY OF PRESENT ILLNESS:   Crystal Hobbs is a 73 y.o. old female with past medical history listed below, is here for follow up for type 2 diabetes mellitus / hypothyroidism.  Pertinent Diabetes History: Patient was previously seen by Dr. Von, was last time seen in August 2024.  Patient was diagnosed with type 2 diabetes mellitus in 2011.  Patient has controlled type 2 diabetes mellitus.  Chronic Diabetes Complications : Retinopathy: no. Last ophthalmology exam was done on annually, following with ophthalmology regularly.  Nephropathy: yes /CKD 3A, on ACE/ARB /olmesartan . Peripheral neuropathy: yes, on burning.  Coronary artery disease: yes, ? Stroke: no  Relevant comorbidities and cardiovascular risk factors: Obesity: no Body mass index is 25.6 kg/m.  Hypertension: Yes  Hyperlipidemia : Yes, on statin   Current / Home Diabetic regimen includes:  Jardiance  10 mg daily. Ozempic  1 mg weekly.  Prior diabetic medications: In the past she had taken Victoza , Afrezza, V-Go pump, she used to be on OmniPod insulin  pump started in 2019. Diarrhea from metformin in the past.  Hair loss from ? Actos.  Patient had UTI and vaginal yeast infection with high dose of Jardiance  in the past.  Glycemic data:   Glucometer data, reviewed directly from the meter.  Not able to download in the clinic today, as follows mostly acceptable blood sugar.  131, 125, 127, 125, 120, 133, 121, 126, 115, 121, 125   Hypoglycemia: Patient has no hypoglycemic episodes. Patient has hypoglycemia awareness.  Factors modifying glucose control: 1.  Diabetic diet assessment: 3 meals a day.  2.  Staying active or exercising: Regularly  walking and playing tennis.  3.  Medication compliance: compliant all of the time.  # Hypothyroidism -She has been taking levothyroxine  112 mcg daily.   Interval history  Diabetes regimen is reviewed and noted above.  She is now on Jardiance  10 mg daily, denies having UTI or yeast infection after decreasing dose of Jardiance .  Glucometer data as reviewed above.  She has been taking levothyroxine  112 mcg daily.  No hypo and hyperthyroid symptoms.  She had normal thyroid  function test in April 2025.    REVIEW OF SYSTEMS As per history of present illness.   PAST MEDICAL HISTORY: Past Medical History:  Diagnosis Date   Allergy    Arthritis    Atherosclerotic heart disease    Complication of anesthesia    takes along time to wake up   Contact lens/glasses fitting    wears glasses or contacts   Coronary artery disease 01/13/2007   s/p PCI of diagonal    COVID    Diabetes mellitus without complication (HCC)    Dyslipidemia    Heart attack (HCC)    had a blood clot, got a stent, but pt states she was told it was not from plaque   Hemorrhoids    History of UTI    Hyperlipidemia    Hypertension    Hypothyroidism  Obesity    Snores    Vertigo     PAST SURGICAL HISTORY: Past Surgical History:  Procedure Laterality Date   CARDIAC CATHETERIZATION  09/14/2006   stent   COLONOSCOPY     rotator cuff repair Right    twice   SHOULDER ARTHROSCOPY WITH OPEN ROTATOR CUFF REPAIR Right 02/28/2013   Procedure: right shoulder arthroscopy, distal clavicle sculpting, open rotator cuff repair;  Surgeon: Lamar LULLA Leonor Mickey., MD;  Location: [facility];  Service: Orthopedics;  Laterality: Right;   TUBAL LIGATION      ALLERGIES: Allergies  Allergen Reactions   Actos [Pioglitazone]     hairloss   Metformin And Related Diarrhea   Novocain [Procaine]     syncopy   Sulfa Antibiotics Itching    FAMILY HISTORY:  Family History  Problem Relation Age of Onset   Lupus  Mother    Hypertension Father    Heart attack Father    Hypertension Sister    Atrial fibrillation Sister    Diabetes Brother    Hypertension Brother    Sleep apnea Brother    CAD Brother    Sleep apnea Son    Leukemia Maternal Aunt    Other Maternal Uncle        blood disorder   Heart attack Other    Diabetes Paternal Aunt    Diabetes Paternal Uncle     SOCIAL HISTORY: Social History   Socioeconomic History   Marital status: Married    Spouse name: Not on file   Number of children: Not on file   Years of education: Not on file   Highest education level: Not on file  Occupational History   Not on file  Tobacco Use   Smoking status: Never   Smokeless tobacco: Never  Substance and Sexual Activity   Alcohol use: Yes    Comment: occ   Drug use: No   Sexual activity: Not on file  Other Topics Concern   Not on file  Social History Narrative   ** Merged History Encounter **       Caffeine: 2 cups/day (diet soda)   Social Drivers of Corporate investment banker Strain: Not on file  Food Insecurity: Not on file  Transportation Needs: Not on file  Physical Activity: Not on file  Stress: Not on file  Social Connections: Not on file    MEDICATIONS:  Current Outpatient Medications  Medication Sig Dispense Refill   amLODipine  (NORVASC ) 5 MG tablet Take 1 tablet (5 mg total) by mouth daily. Please make overdue appt with Dr. Shlomo before anymore refills. Thank you 2nd attempt 15 tablet 0   aspirin  EC 81 MG tablet Take 81 mg by mouth daily. Patient is taking 4 pills daily     atorvastatin  (LIPITOR) 40 MG tablet Take 1 tablet (40 mg total) by mouth daily. 90 tablet 3   Blood Glucose Monitoring Suppl (CONTOUR NEXT MONITOR) w/Device KIT Use to check blood sugar daily. 1 kit 0   empagliflozin  (JARDIANCE ) 10 MG TABS tablet Take 1 tablet (10 mg total) by mouth daily. 90 tablet 3   ezetimibe  (ZETIA ) 10 MG tablet Take 1 tablet (10 mg total) by mouth daily. 90 tablet 3   glucose  blood (CONTOUR NEXT TEST) test strip USE TO TEST BLOOD SUGAR 7 TIMES DAILY 300 strip 3   levothyroxine  (SYNTHROID ) 112 MCG tablet TAKE 1 TABLET(112 MCG) BY MOUTH DAILY 90 tablet 3   nitroGLYCERIN  (NITROSTAT ) 0.4 MG SL  tablet Place 1 tablet (0.4 mg total) under the tongue every 5 (five) minutes as needed for chest pain. 90 tablet 3   olmesartan  (BENICAR ) 20 MG tablet TAKE 1 TABLET(20 MG) BY MOUTH DAILY 90 tablet 1   omega-3 acid ethyl esters (LOVAZA) 1 G capsule Take 2 g by mouth 2 (two) times daily.     Semaglutide , 2 MG/DOSE, (OZEMPIC , 2 MG/DOSE,) 8 MG/3ML SOPN Inject 2 mg weekly (Patient taking differently: Inject 1 mg weekly) 9 mL 3   No current facility-administered medications for this visit.    PHYSICAL EXAM: Vitals:   03/06/24 1442 03/06/24 1443  BP: (!) 148/80 (!) 158/80  Pulse: 69   Resp: 20   SpO2: 98%   Weight: 158 lb 9.6 oz (71.9 kg)   Height: 5' 6 (1.676 m)     Body mass index is 25.6 kg/m.  Wt Readings from Last 3 Encounters:  03/06/24 158 lb 9.6 oz (71.9 kg)  12/23/23 160 lb 3.2 oz (72.7 kg)  10/22/23 161 lb (73 kg)    General: Well developed, well nourished female in no apparent distress.  HEENT: AT/[place], no external lesions.  Eyes: Conjunctiva clear and no icterus. Neck: Neck supple  Lungs: Respirations not labored Neurologic: Alert, oriented, normal speech Extremities / Skin: Dry.   Psychiatric: Does not appear depressed or anxious  Diabetic Foot Exam - Simple   No data filed    LABS Reviewed Lab Results  Component Value Date   HGBA1C 6.9 (H) 12/20/2023   HGBA1C 7.0 (H) 08/10/2023   HGBA1C 7.4 (H) 04/27/2023   Lab Results  Component Value Date   FRUCTOSAMINE 230 04/12/2017   FRUCTOSAMINE 245 12/01/2016   Lab Results  Component Value Date   CHOL 160 04/15/2021   HDL 38.00 (L) 04/15/2021   LDLCALC 89 04/15/2021   LDLDIRECT 61.0 10/15/2022   TRIG 167.0 (H) 04/15/2021   CHOLHDL 4 04/15/2021   Lab Results  Component Value Date   MICRALBCREAT  115 (H) 12/23/2023   MICRALBCREAT 22.1 01/11/2023   Lab Results  Component Value Date   CREATININE 1.01 (H) 12/20/2023   Lab Results  Component Value Date   GFR 47.43 (L) 04/27/2023    ASSESSMENT / PLAN  1. Uncontrolled type 2 diabetes mellitus with hyperglycemia, without long-term current use of insulin  (HCC)   2. Acquired autoimmune hypothyroidism     Diabetes Mellitus type 2, complicated by CKD/peripheral neuropathy /microalbuminuria - Diabetic status / severity: Fair control, improving.  Lab Results  Component Value Date   HGBA1C 6.9 (H) 12/20/2023    - Hemoglobin A1c goal : <6.5%  Mostly susceptible blood sugar on glucometer data.  - Medications: No change.  I) continue Jardiance  10 mg daily.   II) continue Ozempic  1 mg weekly.  She has been using 2 mg Ozempic  device and dial halfway.  - Home glucose testing: Check in the morning fasting daily. - Discussed/ Gave Hypoglycemia treatment plan.  # Consult : not required at this time.   # Annual urine for microalbuminuria/ creatinine ratio, + microalbuminuria currently, continue ACE/ARB /olmesartan  /and Jardiance . Last  Lab Results  Component Value Date   MICRALBCREAT 115 (H) 12/23/2023    # Foot check nightly / neuropathy.  # Annual dilated diabetic eye exams.   - Diet: Make healthy diabetic food choices - Life style / activity / exercise: Discussed.  2. Blood pressure  -  BP Readings from Last 1 Encounters:  03/06/24 (!) 158/80    - Control is  in target.  Mildly elevated blood pressure.  Advised to monitor at home, and discuss with primary care provider if it remains high.  - No change in current plans.  3. Lipid status / Hyperlipidemia - Last  Lab Results  Component Value Date   LDLCALC 89 04/15/2021   - Continue o atorvastatin  40 mg daily and Zetia  10 mg daily.  Managed by primary care provider.  # Hypothyroidism -Patient had a function test normal.  Continue current dose of  levothyroxine . -Continue levothyroxine  112 mcg daily.   -Annual thyroid  lab.  Diagnoses and all orders for this visit:  Uncontrolled type 2 diabetes mellitus with hyperglycemia, without long-term current use of insulin  (HCC)  Acquired autoimmune hypothyroidism    DISPOSITION Follow up in clinic in 4 months suggested.   All questions answered and patient verbalized understanding of the plan.  [place] Dorothee Napierkowski, MD [facility] [address] [place], [address] [place], [place] [address] Phone # [phone]  At least part of this note was generated using voice recognition software. Inadvertent word errors may have occurred, which were not recognized during the proofreading process.

## 2024-04-13 DIAGNOSIS — I10 Essential (primary) hypertension: Secondary | ICD-10-CM | POA: Diagnosis not present

## 2024-04-13 DIAGNOSIS — E7800 Pure hypercholesterolemia, unspecified: Secondary | ICD-10-CM | POA: Diagnosis not present

## 2024-04-13 DIAGNOSIS — I2510 Atherosclerotic heart disease of native coronary artery without angina pectoris: Secondary | ICD-10-CM | POA: Diagnosis not present

## 2024-04-13 DIAGNOSIS — E039 Hypothyroidism, unspecified: Secondary | ICD-10-CM | POA: Diagnosis not present

## 2024-04-18 ENCOUNTER — Other Ambulatory Visit: Payer: Self-pay | Admitting: Endocrinology

## 2024-04-18 NOTE — Telephone Encounter (Signed)
 Refill request complete

## 2024-05-14 DIAGNOSIS — I2510 Atherosclerotic heart disease of native coronary artery without angina pectoris: Secondary | ICD-10-CM | POA: Diagnosis not present

## 2024-05-14 DIAGNOSIS — I10 Essential (primary) hypertension: Secondary | ICD-10-CM | POA: Diagnosis not present

## 2024-05-14 DIAGNOSIS — E7800 Pure hypercholesterolemia, unspecified: Secondary | ICD-10-CM | POA: Diagnosis not present

## 2024-05-14 DIAGNOSIS — E039 Hypothyroidism, unspecified: Secondary | ICD-10-CM | POA: Diagnosis not present

## 2024-06-13 DIAGNOSIS — I2510 Atherosclerotic heart disease of native coronary artery without angina pectoris: Secondary | ICD-10-CM | POA: Diagnosis not present

## 2024-06-13 DIAGNOSIS — I10 Essential (primary) hypertension: Secondary | ICD-10-CM | POA: Diagnosis not present

## 2024-06-13 DIAGNOSIS — E7800 Pure hypercholesterolemia, unspecified: Secondary | ICD-10-CM | POA: Diagnosis not present

## 2024-06-13 DIAGNOSIS — E039 Hypothyroidism, unspecified: Secondary | ICD-10-CM | POA: Diagnosis not present

## 2024-07-06 ENCOUNTER — Ambulatory Visit: Admitting: Endocrinology

## 2024-07-19 ENCOUNTER — Other Ambulatory Visit: Payer: Self-pay | Admitting: Medical Genetics

## 2024-07-19 DIAGNOSIS — Z006 Encounter for examination for normal comparison and control in clinical research program: Secondary | ICD-10-CM

## 2024-08-03 ENCOUNTER — Ambulatory Visit: Admitting: Endocrinology

## 2024-08-03 ENCOUNTER — Encounter: Payer: Self-pay | Admitting: Endocrinology

## 2024-08-03 ENCOUNTER — Ambulatory Visit: Payer: Self-pay | Admitting: Endocrinology

## 2024-08-03 VITALS — BP 136/80 | HR 64 | Resp 16 | Ht 66.0 in | Wt 157.0 lb

## 2024-08-03 DIAGNOSIS — Z7985 Long-term (current) use of injectable non-insulin antidiabetic drugs: Secondary | ICD-10-CM

## 2024-08-03 DIAGNOSIS — Z7984 Long term (current) use of oral hypoglycemic drugs: Secondary | ICD-10-CM | POA: Diagnosis not present

## 2024-08-03 DIAGNOSIS — E063 Autoimmune thyroiditis: Secondary | ICD-10-CM

## 2024-08-03 DIAGNOSIS — E1165 Type 2 diabetes mellitus with hyperglycemia: Secondary | ICD-10-CM

## 2024-08-03 LAB — POCT GLYCOSYLATED HEMOGLOBIN (HGB A1C): Hemoglobin A1C: 6.7 % — AB (ref 4.0–5.6)

## 2024-08-03 MED ORDER — OZEMPIC (2 MG/DOSE) 8 MG/3ML ~~LOC~~ SOPN: PEN_INJECTOR | SUBCUTANEOUS | 3 refills | Status: AC

## 2024-08-03 NOTE — Progress Notes (Signed)
 Outpatient Endocrinology Note Crystal Older, MD  08/03/24  Patient's Name: Crystal Hobbs    DOB: 10/07/1950    MRN: [id number]                                                    REASON OF VISIT: Follow up for type 2 diabetes mellitus / hypothyroidism  PCP: Crystal Pellet, MD  HISTORY OF PRESENT ILLNESS:   Crystal Hobbs is a 73 y.o. old female with past medical history listed below, is here for follow up for type 2 diabetes mellitus / hypothyroidism.  Pertinent Diabetes History: Patient was previously seen by Dr. Von, was last time seen in August 2024.  Patient was diagnosed with type 2 diabetes mellitus in 2011.  Patient has controlled type 2 diabetes mellitus.  Chronic Diabetes Complications : Retinopathy: no. Last ophthalmology exam was done on annually, following with ophthalmology regularly.  Nephropathy: yes /CKD 3A, on ACE/ARB /olmesartan . Peripheral neuropathy: yes, on burning.  Coronary artery disease: yes, ? Stroke: no  Relevant comorbidities and cardiovascular risk factors: Obesity: no Body mass index is 25.34 kg/m.  Hypertension: Yes  Hyperlipidemia : Yes, on statin   Current / Home Diabetic regimen includes:  Jardiance  10 mg daily. Ozempic  1 mg weekly.  Prior diabetic medications: In the past she had taken Victoza , Afrezza, V-Go pump, she used to be on OmniPod insulin  pump started in 2019. Diarrhea from metformin in the past.  Hair loss from ? Actos.  Patient had UTI and vaginal yeast infection with high dose of Jardiance  in the past.  Glycemic data:   Glucometer data, downloaded from November 7 to August 03, 2024, 14 days.  Average blood sugar 128, some of the fasting blood sugar 124, 125, 132, 116, 126, 128.  Hypoglycemia: Patient has no hypoglycemic episodes. Patient has hypoglycemia awareness.  Factors modifying glucose control: 1.  Diabetic diet assessment: 3 meals a day.  2.  Staying active or exercising: Regularly walking and playing  tennis.  3.  Medication compliance: compliant all of the time.  # Hypothyroidism -She has been taking levothyroxine  112 mcg daily.   Interval history  Diabetes regimen as reviewed and noted above.  Hemoglobin A1c 6.7% slightly improved.  She has been tolerating Mounjaro  well.  Glucometer data as reviewed above, mostly acceptable blood sugar.  Occasional burning of the feet otherwise no numbness and tingling.  She has been taking levothyroxine  112 mcg daily.  No hypo and hyperthyroid symptoms.  No other complaints today.   REVIEW OF SYSTEMS As per history of present illness.   PAST MEDICAL HISTORY: Past Medical History:  Diagnosis Date   Allergy    Arthritis    Atherosclerotic heart disease    Complication of anesthesia    takes along time to wake up   Contact lens/glasses fitting    wears glasses or contacts   Coronary artery disease 01/13/2007   s/p PCI of diagonal    COVID    Diabetes mellitus without complication (HCC)    Dyslipidemia    Heart attack (HCC)    had a blood clot, got a stent, but pt states she was told it was not from plaque   Hemorrhoids    History of UTI    Hyperlipidemia    Hypertension    Hypothyroidism    Obesity  Snores    Vertigo     PAST SURGICAL HISTORY: Past Surgical History:  Procedure Laterality Date   CARDIAC CATHETERIZATION  09/14/2006   stent   COLONOSCOPY     rotator cuff repair Right    twice   SHOULDER ARTHROSCOPY WITH OPEN ROTATOR CUFF REPAIR Right 02/28/2013   Procedure: right shoulder arthroscopy, distal clavicle sculpting, open rotator cuff repair;  Surgeon: Crystal Hobbs., MD;  Location: [facility];  Service: Orthopedics;  Laterality: Right;   TUBAL LIGATION      ALLERGIES: Allergies  Allergen Reactions   Actos [Pioglitazone]     hairloss   Metformin And Related Diarrhea   Novocain [Procaine]     syncopy   Sulfa Antibiotics Itching    FAMILY HISTORY:  Family History  Problem Relation Age  of Onset   Lupus Mother    Hypertension Father    Heart attack Father    Hypertension Sister    Atrial fibrillation Sister    Diabetes Brother    Hypertension Brother    Sleep apnea Brother    CAD Brother    Sleep apnea Son    Leukemia Maternal Aunt    Other Maternal Uncle        blood disorder   Heart attack Other    Diabetes Paternal Aunt    Diabetes Paternal Uncle     SOCIAL HISTORY: Social History   Socioeconomic History   Marital status: Married    Spouse name: Not on file   Number of children: Not on file   Years of education: Not on file   Highest education level: Not on file  Occupational History   Not on file  Tobacco Use   Smoking status: Never   Smokeless tobacco: Never  Substance and Sexual Activity   Alcohol use: Yes    Comment: occ   Drug use: No   Sexual activity: Not on file  Other Topics Concern   Not on file  Social History Narrative   ** Merged History Encounter **       Caffeine: 2 cups/day (diet soda)   Social Drivers of Corporate Investment Banker Strain: Not on file  Food Insecurity: Not on file  Transportation Needs: Not on file  Physical Activity: Not on file  Stress: Not on file  Social Connections: Not on file    MEDICATIONS:  Current Outpatient Medications  Medication Sig Dispense Refill   amLODipine  (NORVASC ) 5 MG tablet Take 1 tablet (5 mg total) by mouth daily. Please make overdue appt with Dr. Shlomo before anymore refills. Thank you 2nd attempt 15 tablet 0   aspirin  EC 81 MG tablet Take 81 mg by mouth daily. Patient is taking 4 pills daily     atorvastatin  (LIPITOR) 40 MG tablet Take 1 tablet (40 mg total) by mouth daily. 90 tablet 3   Blood Glucose Monitoring Suppl (CONTOUR NEXT MONITOR) w/Device KIT Use to check blood sugar daily. 1 kit 0   CONTOUR NEXT TEST test strip USE TO TEST BLOOD SUGAR 7 TIMES DAILY 300 strip 3   empagliflozin  (JARDIANCE ) 10 MG TABS tablet Take 1 tablet (10 mg total) by mouth daily. 90 tablet 3    ezetimibe  (ZETIA ) 10 MG tablet Take 1 tablet (10 mg total) by mouth daily. 90 tablet 3   levothyroxine  (SYNTHROID ) 112 MCG tablet TAKE 1 TABLET(112 MCG) BY MOUTH DAILY 90 tablet 3   nitroGLYCERIN  (NITROSTAT ) 0.4 MG SL tablet Place 1 tablet (0.4 mg  total) under the tongue every 5 (five) minutes as needed for chest pain. 90 tablet 3   olmesartan  (BENICAR ) 20 MG tablet TAKE 1 TABLET(20 MG) BY MOUTH DAILY 90 tablet 1   omega-3 acid ethyl esters (LOVAZA) 1 G capsule Take 2 g by mouth 2 (two) times daily.     Semaglutide , 2 MG/DOSE, (OZEMPIC , 2 MG/DOSE,) 8 MG/3ML SOPN Inject 2 mg weekly 9 mL 3   No current facility-administered medications for this visit.    PHYSICAL EXAM: Vitals:   08/03/24 0910  BP: 136/80  Pulse: 64  Resp: 16  SpO2: 96%  Weight: 157 lb (71.2 kg)  Height: 5' 6 (1.676 m)    Body mass index is 25.34 kg/m.  Wt Readings from Last 3 Encounters:  08/03/24 157 lb (71.2 kg)  03/06/24 158 lb 9.6 oz (71.9 kg)  12/23/23 160 lb 3.2 oz (72.7 kg)    General: Well developed, well nourished female in no apparent distress.  HEENT: AT/[place], no external lesions.  Eyes: Conjunctiva clear and no icterus. Neck: Neck supple  Lungs: Respirations not labored Neurologic: Alert, oriented, normal speech Extremities / Skin: Dry.   Psychiatric: Does not appear depressed or anxious  Diabetic Foot Exam - Simple   Simple Foot Form Diabetic Foot exam was performed with the following findings: Yes 08/03/2024  9:40 AM  Visual Inspection No deformities, no ulcerations, no other skin breakdown bilaterally: Yes See comments: Yes Sensation Testing Intact to touch and monofilament testing bilaterally: Yes Pulse Check Posterior Tibialis and Dorsalis pulse intact bilaterally: Yes Comments Callus on left great toes medially.     LABS Reviewed Lab Results  Component Value Date   HGBA1C 6.7 (A) 08/03/2024   HGBA1C 6.9 (H) 12/20/2023   HGBA1C 7.0 (H) 08/10/2023   Lab Results  Component  Value Date   FRUCTOSAMINE 230 04/12/2017   FRUCTOSAMINE 245 12/01/2016   Lab Results  Component Value Date   CHOL 160 04/15/2021   HDL 38.00 (L) 04/15/2021   LDLCALC 89 04/15/2021   LDLDIRECT 61.0 10/15/2022   TRIG 167.0 (H) 04/15/2021   CHOLHDL 4 04/15/2021   Lab Results  Component Value Date   MICRALBCREAT 115 (H) 12/23/2023   Lab Results  Component Value Date   CREATININE 1.01 (H) 12/20/2023   Lab Results  Component Value Date   GFR 47.43 (L) 04/27/2023    ASSESSMENT / PLAN  1. Uncontrolled type 2 diabetes mellitus with hyperglycemia, without long-term current use of insulin  (HCC)   2. Acquired autoimmune hypothyroidism     Diabetes Mellitus type 2, complicated by CKD/peripheral neuropathy /microalbuminuria - Diabetic status / severity: Fair control, improving.  Lab Results  Component Value Date   HGBA1C 6.7 (A) 08/03/2024    - Hemoglobin A1c goal : <6.5%  Mostly acceptable blood sugar on glucometer data.  - Medications: No change.  I) continue Jardiance  10 mg daily.   II) continue Ozempic  1 mg weekly.  She has been using 2 mg Ozempic  device and dial halfway.  - Home glucose testing: Check in the morning fasting daily.  - Discussed/ Gave Hypoglycemia treatment plan.  # Consult : not required at this time.   # Annual urine for microalbuminuria/ creatinine ratio, + microalbuminuria currently, continue ACE/ARB /olmesartan  /and Jardiance . Last  Lab Results  Component Value Date   MICRALBCREAT 115 (H) 12/23/2023    # Foot check nightly / neuropathy.  # Annual dilated diabetic eye exams.   - Diet: Make healthy diabetic food choices - Life  style / activity / exercise: Discussed.  2. Blood pressure  -  BP Readings from Last 1 Encounters:  08/03/24 136/80    - Control is in target.  - No change in current plans.  3. Lipid status / Hyperlipidemia - Last  Lab Results  Component Value Date   LDLCALC 89 04/15/2021   - Continue o atorvastatin  40  mg daily and Zetia  10 mg daily.  Managed by primary care provider at [facility].  # Hypothyroidism -Continue levothyroxine  112 mcg daily.   -Annual thyroid  lab.  Diagnoses and all orders for this visit:  Uncontrolled type 2 diabetes mellitus with hyperglycemia, without long-term current use of insulin  (HCC) -     POCT glycosylated hemoglobin (Hb A1C) -     Semaglutide , 2 MG/DOSE, (OZEMPIC , 2 MG/DOSE,) 8 MG/3ML SOPN; Inject 2 mg weekly -     Basic metabolic panel with GFR -     Microalbumin / creatinine urine ratio  Acquired autoimmune hypothyroidism -     T4, free -     TSH    DISPOSITION Follow up in clinic in 4 months suggested.  Labs prior to follow-up visit as ordered.   All questions answered and patient verbalized understanding of the plan.  Lacrystal Barbe, MD [facility] [address] [place], [address] [place], [place] [address] Phone # [phone]  At least part of this note was generated using voice recognition software. Inadvertent word errors may have occurred, which were not recognized during the proofreading process.

## 2024-10-06 ENCOUNTER — Other Ambulatory Visit: Payer: Self-pay | Admitting: Cardiology

## 2024-10-16 ENCOUNTER — Other Ambulatory Visit: Payer: Self-pay | Admitting: Cardiology

## 2024-11-21 ENCOUNTER — Other Ambulatory Visit

## 2024-11-28 ENCOUNTER — Ambulatory Visit: Admitting: Endocrinology
